# Patient Record
Sex: Female | Born: 1945
Health system: Southern US, Community
[De-identification: ages and names within clinical notes are randomized; demographics above are authoritative.]

## PROBLEM LIST (undated history)

## (undated) DIAGNOSIS — J189 Pneumonia, unspecified organism: Secondary | ICD-10-CM

## (undated) DIAGNOSIS — I10 Essential (primary) hypertension: Secondary | ICD-10-CM

## (undated) DIAGNOSIS — Z87442 Personal history of urinary calculi: Secondary | ICD-10-CM

## (undated) DIAGNOSIS — I872 Venous insufficiency (chronic) (peripheral): Secondary | ICD-10-CM

## (undated) DIAGNOSIS — G709 Myoneural disorder, unspecified: Secondary | ICD-10-CM

## (undated) DIAGNOSIS — M810 Age-related osteoporosis without current pathological fracture: Secondary | ICD-10-CM

## (undated) DIAGNOSIS — E785 Hyperlipidemia, unspecified: Secondary | ICD-10-CM

## (undated) HISTORY — PX: NO PAST SURGERIES: SHX2092

## (undated) HISTORY — DX: Hyperlipidemia, unspecified: E78.5

## (undated) HISTORY — DX: Venous insufficiency (chronic) (peripheral): I87.2

## (undated) HISTORY — DX: Essential (primary) hypertension: I10

## (undated) HISTORY — DX: Age-related osteoporosis without current pathological fracture: M81.0

---

## 2000-01-01 ENCOUNTER — Encounter: Admission: RE | Admit: 2000-01-01 | Discharge: 2000-01-01 | Payer: Self-pay | Admitting: Family Medicine

## 2000-01-03 ENCOUNTER — Encounter: Payer: Self-pay | Admitting: Family Medicine

## 2003-12-07 ENCOUNTER — Encounter: Admission: RE | Admit: 2003-12-07 | Discharge: 2003-12-07 | Payer: Self-pay | Admitting: Family Medicine

## 2008-12-13 ENCOUNTER — Encounter: Admission: RE | Admit: 2008-12-13 | Discharge: 2008-12-13 | Payer: Self-pay | Admitting: Family Medicine

## 2008-12-15 ENCOUNTER — Encounter: Admission: RE | Admit: 2008-12-15 | Discharge: 2008-12-15 | Payer: Self-pay | Admitting: Family Medicine

## 2010-01-03 ENCOUNTER — Encounter: Admission: RE | Admit: 2010-01-03 | Discharge: 2010-01-03 | Payer: Self-pay | Admitting: Family Medicine

## 2010-12-09 ENCOUNTER — Encounter: Payer: Self-pay | Admitting: Family Medicine

## 2011-10-18 ENCOUNTER — Other Ambulatory Visit: Payer: Self-pay | Admitting: Family Medicine

## 2011-10-18 DIAGNOSIS — R102 Pelvic and perineal pain: Secondary | ICD-10-CM

## 2011-10-22 ENCOUNTER — Other Ambulatory Visit: Payer: Self-pay | Admitting: Family Medicine

## 2011-10-22 ENCOUNTER — Ambulatory Visit
Admission: RE | Admit: 2011-10-22 | Discharge: 2011-10-22 | Disposition: A | Discharge status: Home / Self Care | Payer: Medicare Other | Source: Ambulatory Visit | Attending: Family Medicine | Admitting: Family Medicine

## 2011-10-22 DIAGNOSIS — R102 Pelvic and perineal pain: Secondary | ICD-10-CM

## 2012-02-06 ENCOUNTER — Ambulatory Visit (INDEPENDENT_AMBULATORY_CARE_PROVIDER_SITE_OTHER): Payer: Medicare Other | Admitting: Family Medicine

## 2012-02-06 VITALS — BP 146/77 | HR 84 | Temp 98.2°F | Resp 16 | Ht 66.0 in | Wt 145.0 lb

## 2012-02-06 DIAGNOSIS — R350 Frequency of micturition: Secondary | ICD-10-CM

## 2012-02-06 LAB — POCT URINALYSIS DIPSTICK
Nitrite, UA: NEGATIVE
Protein, UA: NEGATIVE
Urobilinogen, UA: 0.2
pH, UA: 6

## 2012-02-06 LAB — POCT UA - MICROSCOPIC ONLY
Casts, Ur, LPF, POC: NEGATIVE
Yeast, UA: NEGATIVE

## 2012-02-06 MED ORDER — NITROFURANTOIN MONOHYD MACRO 100 MG PO CAPS: 100.0000 mg | ORAL_CAPSULE | Freq: Two times a day (BID) | ORAL | Status: AC

## 2012-02-06 NOTE — Progress Notes (Signed)
  Subjective:    Patient ID: Nina Ramos, female    DOB: [birth year], 66 y.o.   MRN: [id number]  HPI 66 yo female with UTI symptoms for 1 week.  Having pressure and increased frequency, slight dysuria.  No fever or back pain. Last one was last summer.    Review of Systems Negative except as per HPI     Objective:   Physical Exam  Constitutional: She appears well-developed.  Pulmonary/Chest: Effort normal.  Abdominal: Soft. There is no tenderness.  Neurological: She is alert.     Results for orders placed in visit on 02/06/12  POCT URINALYSIS DIPSTICK      Component Value Range   Color, UA yellow     Clarity, UA slightly cloudy     Glucose, UA neg     Bilirubin, UA neg     Ketones, UA neg     Spec Grav, UA <=1.005     Blood, UA trace-lysed     pH, UA 6.0     Protein, UA neg     Urobilinogen, UA 0.2     Nitrite, UA neg     Leukocytes, UA large (3+)    POCT UA - MICROSCOPIC ONLY      Component Value Range   WBC, Ur, HPF, POC 2-10     RBC, urine, microscopic 0-1     Bacteria, U Microscopic trace     Mucus, UA neg     Epithelial cells, urine per micros neg     Crystals, Ur, HPF, POC neg     Casts, Ur, LPF, POC neg     Yeast, UA neg          Assessment & Plan:  Likely UTI.  Culture pending.  Macrobid.

## 2012-02-10 LAB — URINE CULTURE: Colony Count: 75000

## 2012-06-22 ENCOUNTER — Ambulatory Visit (INDEPENDENT_AMBULATORY_CARE_PROVIDER_SITE_OTHER): Payer: Medicare Other | Admitting: Family Medicine

## 2012-06-22 VITALS — BP 132/78 | HR 85 | Temp 97.9°F | Resp 18 | Ht 65.5 in | Wt 144.0 lb

## 2012-06-22 DIAGNOSIS — R350 Frequency of micturition: Secondary | ICD-10-CM

## 2012-06-22 DIAGNOSIS — N390 Urinary tract infection, site not specified: Secondary | ICD-10-CM

## 2012-06-22 LAB — POCT URINALYSIS DIPSTICK
Bilirubin, UA: NEGATIVE
Glucose, UA: NEGATIVE
Ketones, UA: NEGATIVE
Nitrite, UA: NEGATIVE
Protein, UA: NEGATIVE
Spec Grav, UA: 1.015
Urobilinogen, UA: 0.2
pH, UA: 5.5

## 2012-06-22 LAB — POCT UA - MICROSCOPIC ONLY
Casts, Ur, LPF, POC: NEGATIVE
Crystals, Ur, HPF, POC: NEGATIVE
Mucus, UA: NEGATIVE
Yeast, UA: NEGATIVE

## 2012-06-22 MED ORDER — NITROFURANTOIN MONOHYD MACRO 100 MG PO CAPS: 100.0000 mg | ORAL_CAPSULE | Freq: Two times a day (BID) | ORAL | Status: AC

## 2012-06-22 NOTE — Progress Notes (Signed)
[facility]:  Office Visit  Chief Complaint:  Chief Complaint  Patient presents with  . Urinary Tract Infection    patient thinks she has a uti     HPI: Nina Ramos is a 66 y.o. female who complains of  Increase urinary frequency and bladder pressure. Was here with E. Coli UTI in March. Denies UTI, Denies hematuria, dysuria  History reviewed. No pertinent past medical history. History reviewed. No pertinent past surgical history. History   Social History  . Marital Status: Married    Spouse Name: N/A    Number of Children: N/A  . Years of Education: N/A   Social History Main Topics  . Smoking status: Former Games developer  . Smokeless tobacco: Former Neurosurgeon    Quit date: 02/05/1994  . Alcohol Use: None  . Drug Use: None  . Sexually Active: None   Other Topics Concern  . None   Social History Narrative  . None   Family History  Problem Relation Age of Onset  . Asthma Mother   . Heart disease Father    No Known Allergies Prior to Admission medications   Not on File     ROS: The patient denies fevers, chills, night sweats, unintentional weight loss, chest pain, palpitations, wheezing, dyspnea on exertion, nausea, vomiting, abdominal pain, dysuria, hematuria, melena, numbness, weakness, or tingling.  All other systems have been reviewed and were otherwise negative with the exception of those mentioned in the HPI and as above.    PHYSICAL EXAM: Filed Vitals:   06/22/12 1215  BP: 132/78  Pulse: 85  Temp: 97.9 F (36.6 C)  Resp: 18   Filed Vitals:   06/22/12 1215  Height: 5' 5.5" (1.664 m)  Weight: 144 lb (65.318 kg)   Body mass index is 23.60 kg/(m^2).  General: Alert, no acute distress HEENT:  Normocephalic, atraumatic, oropharynx patent.  Cardiovascular:  Regular rate and rhythm, no rubs murmurs or gallops.  No Carotid bruits, radial pulse intact. No pedal edema.  Respiratory: Clear to auscultation bilaterally.  No wheezes, rales, or  rhonchi.  No cyanosis, no use of accessory musculature GI: No organomegaly, abdomen is soft and non-tender, positive bowel sounds.  No masses. Skin: No rashes. Neurologic: Facial musculature symmetric. Psychiatric: Patient is appropriate throughout our interaction. Lymphatic: No cervical lymphadenopathy Musculoskeletal: Gait intact. NO CVA tenderness   LABS: Results for orders placed in visit on 06/22/12  POCT UA - MICROSCOPIC ONLY      Component Value Range   WBC, Ur, HPF, POC 40-48     RBC, urine, microscopic 0-2     Bacteria, U Microscopic trace     Mucus, UA neg     Epithelial cells, urine per micros 0-1     Crystals, Ur, HPF, POC neg     Casts, Ur, LPF, POC neg     Yeast, UA neg    POCT URINALYSIS DIPSTICK      Component Value Range   Color, UA yellow     Clarity, UA cloudy     Glucose, UA neg     Bilirubin, UA neg     Ketones, UA neg     Spec Grav, UA 1.015     Blood, UA small     pH, UA 5.5     Protein, UA neg     Urobilinogen, UA 0.2     Nitrite, UA neg     Leukocytes, UA moderate (2+)  EKG/XRAY:   Primary read interpreted by Dr. Conley Rolls at [facility]   ASSESSMENT/PLAN: Encounter Diagnoses  Name Primary?  . Urine frequency Yes  . UTI (lower urinary tract infection)    Macrobid x 7 days Culture pending     Ginevra Tacker PHUONG, DO 06/22/2012 1:05 PM

## 2012-06-25 LAB — URINE CULTURE: Colony Count: 15000

## 2012-11-06 ENCOUNTER — Ambulatory Visit (INDEPENDENT_AMBULATORY_CARE_PROVIDER_SITE_OTHER): Payer: Medicare Other | Admitting: *Deleted

## 2012-11-06 ENCOUNTER — Encounter: Payer: Self-pay | Admitting: *Deleted

## 2012-11-06 VITALS — Temp 97.8°F

## 2012-11-06 DIAGNOSIS — Z23 Encounter for immunization: Secondary | ICD-10-CM

## 2013-06-09 ENCOUNTER — Ambulatory Visit (INDEPENDENT_AMBULATORY_CARE_PROVIDER_SITE_OTHER): Payer: Medicare Other | Admitting: Emergency Medicine

## 2013-06-09 VITALS — BP 126/72 | HR 81 | Temp 97.7°F | Resp 18 | Ht 66.0 in | Wt 144.0 lb

## 2013-06-09 DIAGNOSIS — H1030 Unspecified acute conjunctivitis, unspecified eye: Secondary | ICD-10-CM

## 2013-06-09 MED ORDER — OFLOXACIN 0.3 % OP SOLN: 1.0000 [drp] | Freq: Four times a day (QID) | OPHTHALMIC | Status: DC

## 2013-06-09 NOTE — Progress Notes (Signed)
[facility] [address] [place] [address] [phone]  Date:  06/09/2013   Name:  Nina Ramos   DOB:  02-Apr-1946   MRN:  [id number]  PCP:  Rockne Coons, DO    Chief Complaint: Conjunctivitis   History of Present Illness:  Nina Ramos is a 67 y.o. very pleasant female patient who presents with the following:  2 day history of foreign body sensation in right eye.  No history of actual FB.  Eye now red and has no pain. No visual complaints.  Put a flax seed in her eye to attract the object and that did not have the desired effect.  No improvement with over the counter medications or other home remedies. Denies other complaint or health concern today.   There are no active problems to display for this patient.   History reviewed. No pertinent past medical history.  History reviewed. No pertinent past surgical history.  History  Substance Use Topics  . Smoking status: Former Games developer  . Smokeless tobacco: Former Neurosurgeon    Quit date: 02/05/1994  . Alcohol Use: Not on file    Family History  Problem Relation Age of Onset  . Asthma Mother   . Heart disease Father     No Known Allergies  Medication list has been reviewed and updated.  No current outpatient prescriptions on file prior to visit.   No current facility-administered medications on file prior to visit.    Review of Systems:  As per HPI, otherwise negative.    Physical Examination: Filed Vitals:   06/09/13 1201  BP: 126/72  Pulse: 81  Temp: 97.7 F (36.5 C)  Resp: 18   Filed Vitals:   06/09/13 1201  Height: 5\' 6"  (1.676 m)  Weight: 144 lb (65.318 kg)   Body mass index is 23.25 kg/(m^2). Ideal Body Weight: Weight in (lb) to have BMI = 25: 154.6   GEN: WDWN, NAD, Non-toxic, Alert & Oriented x 3 HEENT: Atraumatic, Normocephalic.  Ears and Nose: No external deformity. EXTR: No clubbing/cyanosis/edema NEURO: Normal gait.  PSYCH: Normally interactive. Conversant.  Not depressed or anxious appearing.  Calm demeanor.  EYE:  Right eye injected. No visible foreign body.  PRRERLA EOMI.  No fluorescein uptake.    Assessment and Plan: Conjunctivitis Ocuflox Follow up tomorrow for continued pain   Signed,  Phillips Odor, MD

## 2013-06-09 NOTE — Patient Instructions (Addendum)

## 2013-10-10 ENCOUNTER — Telehealth: Payer: Self-pay | Admitting: *Deleted

## 2013-10-10 NOTE — Telephone Encounter (Signed)
Called and left message for patient reminding her that a mammogram is overdue and to please call and schedule before the end of the calendar year if possible.

## 2013-10-20 ENCOUNTER — Ambulatory Visit (INDEPENDENT_AMBULATORY_CARE_PROVIDER_SITE_OTHER): Payer: Medicare Other | Admitting: Family Medicine

## 2013-10-20 VITALS — BP 138/78 | HR 82 | Temp 97.8°F | Resp 16 | Ht 65.5 in | Wt 142.6 lb

## 2013-10-20 DIAGNOSIS — H9190 Unspecified hearing loss, unspecified ear: Secondary | ICD-10-CM

## 2013-10-20 DIAGNOSIS — H6122 Impacted cerumen, left ear: Secondary | ICD-10-CM

## 2013-10-20 DIAGNOSIS — H6120 Impacted cerumen, unspecified ear: Secondary | ICD-10-CM

## 2013-10-20 NOTE — Progress Notes (Signed)
[facility] [address] [place] [address] [phone]  Date:  10/20/2013   Name:  Nina Ramos   DOB:  Dec 26, 1945   MRN:  [id number]  PCP:  Rockne Coons, DO    Chief Complaint: Cerumen Impaction   History of Present Illness:  Nina Ramos is a 67 y.o. very pleasant female patient who presents with the following:  Here due to concern regarding possible impacted cerumen left ear.  She has noted a feeling of something in the ear and decreased hearing over the last several weeks.  Admits she usually uses a bobby pin to clear wax from her ear.  Otherwise she is feeling well today  There are no active problems to display for this patient.   No past medical history on file.  No past surgical history on file.  History  Substance Use Topics  . Smoking status: Former Games developer  . Smokeless tobacco: Former Neurosurgeon    Quit date: 02/05/1994  . Alcohol Use: Not on file    Family History  Problem Relation Age of Onset  . Asthma Mother   . Heart disease Father     No Known Allergies  Medication list has been reviewed and updated.  Current Outpatient Prescriptions on File Prior to Visit  Medication Sig Dispense Refill  . ofloxacin (OCUFLOX) 0.3 % ophthalmic solution Place 1 drop into the right eye 4 (four) times daily.  5 mL  0   No current facility-administered medications on file prior to visit.    Review of Systems:  As per HPI- otherwise negative.   Physical Examination: Filed Vitals:   10/20/13 1424  BP: 138/78  Pulse: 82  Temp: 97.8 F (36.6 C)  Resp: 16   Filed Vitals:   10/20/13 1424  Height: 5' 5.5" (1.664 m)  Weight: 142 lb 9.6 oz (64.683 kg)   Body mass index is 23.36 kg/(m^2). Ideal Body Weight: Weight in (lb) to have BMI = 25: 152.2  GEN: WDWN, NAD, Non-toxic, A & O x 3 HEENT: Atraumatic, Normocephalic. Neck supple. No masses, No LAD. Ears and Nose: No external deformity. CV: RRR, No M/G/R. No JVD. No thrill. No extra  heart sounds. PULM: CTA B, no wheezes, crackles, rhonchi. No retractions. No resp. distress. No accessory muscle use. EXTR: No c/c/e NEURO Normal gait.  PSYCH: Normally interactive. Conversant. Not depressed or anxious appearing.  Calm demeanor.  Left TM is obscured by cerumen.  Right TM normal. Ear treated with colace, irrigated, treated with colace again and then re- irrigated.  A combination of irrigation and ear curette was used by myself personally to removed impacted cerumen from the left ear canal. The TM and ear then appeared to be normal.    Assessment and Plan: Impacted cerumen, left  Irrigated and removed wax from left ear   Signed Abbe Amsterdam, MD

## 2014-01-12 ENCOUNTER — Telehealth: Payer: Self-pay

## 2014-01-12 NOTE — Telephone Encounter (Signed)
Called her back- her Nina Ramos summons is not until April. Asked her to come and see me in the next few weeks so I can document her anxiety sx truthfully.  We have never discussed this face to face as far as I am aware.

## 2014-01-12 NOTE — Telephone Encounter (Signed)
Nina PikesSusan Ramos reported that pt (her mother) is very anxious after receiving a summons for [employer]. She was previously a pt of [name]'s and now considers [name] her PCP. Pt has been seen for hives and shaking stemming from anxiety. Daughter states that pt has a phobia of crowds and being around a lot of people that she does not know. Pt requests a note explaining this that would excuse her from jury duty. We had written one for pt in the past but we do not have a copy in EPIC. I have put her paper chart in Dr Copland's box. [name], can you write a letter or do you need to see pt for this first?

## 2014-01-14 ENCOUNTER — Ambulatory Visit (INDEPENDENT_AMBULATORY_CARE_PROVIDER_SITE_OTHER): Payer: Medicare Other | Admitting: Family Medicine

## 2014-01-14 VITALS — BP 140/80 | HR 85 | Temp 98.1°F | Resp 16 | Ht 65.5 in | Wt 149.0 lb

## 2014-01-14 DIAGNOSIS — F4001 Agoraphobia with panic disorder: Secondary | ICD-10-CM

## 2014-01-14 DIAGNOSIS — F411 Generalized anxiety disorder: Secondary | ICD-10-CM

## 2014-01-14 DIAGNOSIS — F4000 Agoraphobia, unspecified: Secondary | ICD-10-CM | POA: Insufficient documentation

## 2014-01-14 HISTORY — DX: Generalized anxiety disorder: F41.1
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
## 2014-01-14 NOTE — Patient Instructions (Signed)
Take care- good to see you today!

## 2014-02-11 ENCOUNTER — Ambulatory Visit (INDEPENDENT_AMBULATORY_CARE_PROVIDER_SITE_OTHER): Payer: Medicare Other | Admitting: Internal Medicine

## 2014-02-11 VITALS — BP 150/88 | HR 78 | Temp 97.7°F | Resp 18 | Ht 65.0 in | Wt 146.2 lb

## 2014-02-11 DIAGNOSIS — H00019 Hordeolum externum unspecified eye, unspecified eyelid: Secondary | ICD-10-CM

## 2014-02-11 DIAGNOSIS — H0019 Chalazion unspecified eye, unspecified eyelid: Secondary | ICD-10-CM

## 2014-02-11 MED ORDER — AMOXICILLIN 500 MG PO CAPS: 1000.0000 mg | ORAL_CAPSULE | Freq: Two times a day (BID) | ORAL | Status: DC

## 2014-02-11 MED ORDER — ERYTHROMYCIN 5 MG/GM OP OINT: 1.0000 "application " | TOPICAL_OINTMENT | Freq: Every day | OPHTHALMIC | Status: DC

## 2014-02-11 NOTE — Patient Instructions (Signed)
Sty A sty (hordeolum) is an infection of a gland in the eyelid located at the base of the eyelash. A sty may develop a white or yellow head of pus. It can be puffy (swollen). Usually, the sty will burst and pus will come out on its own. They do not leave lumps in the eyelid once they drain. A sty is often confused with another form of cyst of the eyelid called a chalazion. Chalazions occur within the eyelid and not on the edge where the bases of the eyelashes are. They often are red, sore and then form firm lumps in the eyelid. CAUSES   Germs (bacteria).  Lasting (chronic) eyelid inflammation. SYMPTOMS   Tenderness, redness and swelling along the edge of the eyelid at the base of the eyelashes.  Sometimes, there is a white or yellow head of pus. It may or may not drain. DIAGNOSIS  An ophthalmologist will be able to distinguish between a sty and a chalazion and treat the condition appropriately.  TREATMENT   Styes are typically treated with warm packs (compresses) until drainage occurs.  In rare cases, medicines that kill germs (antibiotics) may be prescribed. These antibiotics may be in the form of drops, cream or pills.  If a hard lump has formed, it is generally necessary to do a small incision and remove the hardened contents of the cyst in a minor surgical procedure done in the office.  In suspicious cases, your caregiver may send the contents of the cyst to the lab to be certain that it is not a rare, but dangerous form of cancer of the glands of the eyelid. HOME CARE INSTRUCTIONS   Wash your hands often and dry them with a clean towel. Avoid touching your eyelid. This may spread the infection to other parts of the eye.  Apply heat to your eyelid for 10 to 20 minutes, several times a day, to ease pain and help to heal it faster.  Do not squeeze the sty. Allow it to drain on its own. Wash your eyelid carefully 3 to 4 times per day to remove any pus. SEEK IMMEDIATE MEDICAL CARE IF:    Your eye becomes painful or puffy (swollen).  Your vision changes.  Your sty does not drain by itself within 3 days.  Your sty comes back within a short period of time, even with treatment.  You have redness (inflammation) around the eye.  You have a fever. Document Released: 08/14/2005 Document Revised: 01/27/2012 Document Reviewed: 04/18/2009 ExitCare Patient Information 2014 [employer].  

## 2014-02-11 NOTE — Progress Notes (Signed)
   Subjective:    Patient ID: Nina CreeksNancy T Perret, female    DOB: Jan 09, 1946, 68 y.o.   MRN: [id number]  HPI Has 1072 week old nodule on left upper eye lid. Was tender but less so now. Hx of styes in past.   Review of Systems     Objective:   Physical Exam  Vitals reviewed. Constitutional: She is oriented to person, place, and time. She appears well-developed and well-nourished.  HENT:  Head: Normocephalic.  Eyes: EOM are normal. Pupils are equal, round, and reactive to light. Right eye exhibits no discharge, no exudate and no hordeolum. Left eye exhibits hordeolum. Left eye exhibits no discharge and no exudate. Right conjunctiva is not injected. Right conjunctiva has no hemorrhage. Left conjunctiva is not injected. Left conjunctiva has no hemorrhage.    Cystic nodule 3-624mm  Neck: Normal range of motion.  Musculoskeletal: Normal range of motion.  Neurological: She is alert and oriented to person, place, and time. She exhibits normal muscle tone. Coordination normal.  Skin: Rash noted.  Psychiatric: She has a normal mood and affect. Her behavior is normal.          Assessment & Plan:  Hordeolum/Chalazion Emycin eye ointment/Amoxil 1g bid Warm compresses/Massage See opthalmologist

## 2014-07-07 ENCOUNTER — Ambulatory Visit (INDEPENDENT_AMBULATORY_CARE_PROVIDER_SITE_OTHER): Payer: Medicare Other | Admitting: Physician Assistant

## 2014-07-07 ENCOUNTER — Encounter: Payer: Self-pay | Admitting: Physician Assistant

## 2014-07-07 VITALS — BP 138/74 | HR 64 | Temp 98.0°F | Resp 16 | Ht 64.0 in | Wt 141.0 lb

## 2014-07-07 DIAGNOSIS — L239 Allergic contact dermatitis, unspecified cause: Secondary | ICD-10-CM

## 2014-07-07 DIAGNOSIS — L259 Unspecified contact dermatitis, unspecified cause: Secondary | ICD-10-CM

## 2014-07-07 MED ORDER — PREDNISONE 20 MG PO TABS: ORAL_TABLET | ORAL | Status: DC

## 2014-07-07 NOTE — Progress Notes (Signed)
    Patient ID: Nina Ramos MRN: [id number], DOB: 30-Jan-1946, 68 y.o. Date of Encounter: 07/07/2014, 3:53 PM    Chief Complaint:  Chief Complaint  Patient presents with  . Rash    x3 weeks- 5 red round areas to L lower leg, abdomen and neck- itchy- starts out as blister and then bursts and scabs over     HPI: 68 y.o. year old white female says she initially developed areas of this rash on her lower legs. Developed little clusters of tiny vesicles. Developed several areas of such clusters on her lower legs and since then has developed similar lesions on other areas of her body. She has one area on her lower abdomen, one on her buttock and a couple on her arms.  The initial site which were tiny vesicles several weeks ago now are still not resolved--- those older areas now are pink colored areas. Says that they are very itchy. Says that she works in her yard and flowers a lot and thinks she has gotten into some poison oak or poison ivy.     Home Meds:  None  Allergies: No Known Allergies    Review of Systems: See HPI for pertinent ROS. All other ROS negative.    Physical Exam: Blood pressure 138/74, pulse 64, temperature 98 F (36.7 C), temperature source Oral, resp. rate 16, height 5\' 4"  (1.626 m), weight 141 lb (63.957 kg)., Body mass index is 24.19 kg/(m^2). General: WNWD WF.  Appears in no acute distress. Neck: Supple. No thyromegaly. No lymphadenopathy. Lungs: Clear bilaterally to auscultation without wheezes, rales, or rhonchi. Breathing is unlabored. Heart: Regular rhythm. No murmurs, rubs, or gallops. Msk:  Strength and tone normal for age. Extremities/Skin: Left lower shin: She says this is the newest lesion: Cluster of tiny pink vessicles, entire cluster measures approx 0.5 cm.  She also shows me the area on her lower abdomen. This is an approximate 0.75 cm diameter area of pink coloration with overlying scale--c/w healing. On her forearm she also has cluster of tiny  pink vesicles/papules. Neuro: Alert and oriented X 3. Moves all extremities spontaneously. Gait is normal. CNII-XII grossly in tact. Psych:  Responds to questions appropriately with a normal affect.     ASSESSMENT AND PLAN:  68 y.o. year old female with  1. Allergic dermatitis Consistent with allergic dermatitis most likely poison oak or poison ivy. She is to take the prednisone as directed below. If rash does not resolve after completion of prednisone and she is to followup. Cautioned her that prednisone may cause her to feel jittery and hyper and this is a known side effect of the prednisone. - predniSONE (DELTASONE) 20 MG tablet; Take 3 daily for 2 days, then 2 daily for 2 days, then 1 daily for 2 days.  Dispense: 12 tablet; Refill: 0   Signed, [address]Mary Beth SausalDixon, [place], [facility] 07/07/2014 3:53 PM

## 2014-07-22 ENCOUNTER — Ambulatory Visit (INDEPENDENT_AMBULATORY_CARE_PROVIDER_SITE_OTHER): Payer: Medicare Other | Admitting: Family Medicine

## 2014-07-22 ENCOUNTER — Encounter: Payer: Self-pay | Admitting: Family Medicine

## 2014-07-22 VITALS — BP 130/68 | HR 68 | Temp 98.1°F | Resp 14

## 2014-07-22 DIAGNOSIS — R21 Rash and other nonspecific skin eruption: Secondary | ICD-10-CM | POA: Insufficient documentation

## 2014-07-22 DIAGNOSIS — IMO0002 Reserved for concepts with insufficient information to code with codable children: Secondary | ICD-10-CM

## 2014-07-22 DIAGNOSIS — S29011A Strain of muscle and tendon of front wall of thorax, initial encounter: Secondary | ICD-10-CM | POA: Insufficient documentation

## 2014-07-22 MED ORDER — CLOTRIMAZOLE-BETAMETHASONE 1-0.05 % EX CREA: 1.0000 "application " | TOPICAL_CREAM | Freq: Two times a day (BID) | CUTANEOUS | Status: DC

## 2014-07-22 NOTE — Patient Instructions (Signed)
Take  of ibuprofen and try heat and stretch out the pec muscle Use cream twice a day for the rash, if not better in 1 week, I would recommend dermatology F/U as needed

## 2014-07-22 NOTE — Assessment & Plan Note (Signed)
Muscle strain, OTC Ibuprofen which pt prefers, hold on imaging, not cardiac

## 2014-07-22 NOTE — Assessment & Plan Note (Addendum)
Unclear cause, does not appear to be baterial releated, few spore like lesions on skin scraping KOH at bedside? fungal, does not look like shingles, possible contact dermatitis but did respond well to predisone If it does not clear would recommend dermatology

## 2014-07-22 NOTE — Progress Notes (Signed)
Patient ID: Nina Ramos, female   DOB: 06-30-1946, 68 y.o.   MRN: [id number]   Subjective:    Patient ID: Nina Ramos, female    DOB: 1946/05/15, 68 y.o.   MRN: [id number]  Patient presents for Re-check Rash and Fall  Patient here for recheck on rash appears she was seen about a week and a half ago at that time had a blistering rash that have popped up on her abdomen in her leg she was given prednisone taper this caused a lot of her legs are dry up some but she still has a few new lesions are very tiny. They're still itchy in nature. She does garden a lot therefore she's not sure she became in contact with something in her garden. She denies any change in her diet soap lotion otherwise. She did have one lesion under her axilla but that is already resolved. She denies any fever or recent illness.  Chest wall pain the left side for the past couple of days she states she was cared to plan out to a family member when she tripped and fell with an outstretched arm since then she's had some soreness the left chest wall typically which she takes a breath she did not have any significant bruising. She denies any sharp chest pain with radiation denies any nausea vomiting or shortness of breath associated.   Review Of Systems:  GEN- denies fatigue, fever, weight loss,weakness, recent illness HEENT- denies eye drainage, change in vision, nasal discharge, CVS- denies chest pain, palpitations RESP- denies SOB, cough, wheeze ABD- denies N/V, change in stools, abd pain GU- denies dysuria, hematuria, dribbling, incontinence MSK- denies joint pain,+ muscle aches, injury Neuro- denies headache, dizziness, syncope, seizure activity       Objective:    BP 130/68  Pulse 68  Temp(Src) 98.1 F (36.7 C) (Oral)  Resp 14 GEN- NAD, alert and oriented x3 HEENT-PERRL, EOMI, MMM, oropharynx clear HEENT- PERRL,LAD CVS- RRR, no murmur MSK- Left pec TTP, Rotator cuff intact, biceps in tact, pain with left arm  across shoulder , normal back scratch but discomfort with push off from mid back in pec RESP-CTAB Skin- right lower abdomen - erythematous raised scaly rash, 2 small maculopapular lesions adjancet, left leg, 2 dry dime size scaley macular lesions, NT EXT- No edema Pulses- Radial 2+        Assessment & Plan:      Problem List Items Addressed This Visit   Strain of left pectoralis muscle - Primary     Muscle strain, OTC Ibuprofen which pt prefers, hold on imaging, not cardiac    Rash and nonspecific skin eruption     Unclear cause, does not appear to be baterial releated, few spore like lesions ? fungal, does not look like shingles, possible contact dermatitis but did respond well to predisone       Note: This dictation was prepared with Dragon dictation along with smaller phrase technology. Any transcriptional errors that result from this process are unintentional.

## 2014-08-04 ENCOUNTER — Telehealth: Payer: Self-pay | Admitting: *Deleted

## 2014-08-04 MED ORDER — PROMETHAZINE HCL 25 MG RE SUPP: 25.0000 mg | Freq: Four times a day (QID) | RECTAL | Status: DC | PRN

## 2014-08-04 NOTE — Telephone Encounter (Signed)
Pt called stating needing refill for promethazine , insert one suppostory rectally as needed

## 2014-08-18 ENCOUNTER — Telehealth: Payer: Self-pay | Admitting: Physician Assistant

## 2014-08-18 DIAGNOSIS — R21 Rash and other nonspecific skin eruption: Secondary | ICD-10-CM

## 2014-08-18 NOTE — Telephone Encounter (Signed)
Pt daughter Nina SinnerSusan Ramos is wanting to know what her mother is needing to do next because the blisters are back on her mother. [phone]

## 2014-08-19 NOTE — Telephone Encounter (Signed)
Place order for referral to dermatology. Patient has seen me regarding this rash and has seen Dr. Jeanice Limurham also.  Dr.Whitehall's note  said that she was not certain what was the cause of this rash and if it did not resolve with the cream she prescribed then she would recommend followup with dermatology.

## 2014-08-19 NOTE — Telephone Encounter (Signed)
Dermatology referral- blistering rash

## 2014-08-22 NOTE — Telephone Encounter (Signed)
Daughter aware referral has been initiated

## 2014-10-19 ENCOUNTER — Encounter: Payer: Self-pay | Admitting: Family Medicine

## 2014-10-19 ENCOUNTER — Ambulatory Visit (INDEPENDENT_AMBULATORY_CARE_PROVIDER_SITE_OTHER): Payer: Medicare Other | Admitting: Family Medicine

## 2014-10-19 VITALS — BP 128/74 | HR 72 | Temp 98.5°F | Resp 14 | Ht 65.0 in | Wt 136.0 lb

## 2014-10-19 DIAGNOSIS — L280 Lichen simplex chronicus: Secondary | ICD-10-CM

## 2014-10-19 MED ORDER — HYDROXYZINE HCL 10 MG PO TABS: 10.0000 mg | ORAL_TABLET | Freq: Three times a day (TID) | ORAL | Status: DC | PRN

## 2014-10-19 NOTE — Patient Instructions (Addendum)
Take itching pill as needed We will call with lab results  F/U pending results

## 2014-10-19 NOTE — Progress Notes (Signed)
Patient ID: Nina CreeksNancy T Ramos, female   DOB: [birth year], 68 y.o.   MRN: [id number]   Subjective:    Patient ID: Nina CreeksNancy T Ramos, female    DOB: [birth year], 68 y.o.   MRN: [id number]  Patient presents for Skin Irritation  patient here with continued rash eruption. She was seen back in September after she had had a rash for about 2 months. She was treated with multiple rounds of prednisone as well as topical creams including antifungal she continued to have outbreaks of maculopapular lesions which were very pruritic. I sent her to dermatology biopsy was done which showed lichenoid reaction. She was given another round of steroids he thought it was probably a drug reaction however she does not take any medications on a regular basis she has not taken any NSAIDs in a few months. She now has lesions all over her arms legs abdomen and back which are very pruritic. She does garden and is exposed to some poisonous plants. She does not have any known food allergies.    Review Of Systems:  GEN- denies fatigue, fever, weight loss,weakness, recent illness HEENT- denies eye drainage, change in vision, nasal discharge, CVS- denies chest pain, palpitations RESP- denies SOB, cough, wheeze ABD- denies N/V, change in stools, abd pain GU- denies dysuria, hematuria, dribbling, incontinence MSK- denies joint pain, muscle aches, injury Neuro- denies headache, dizziness, syncope, seizure activity       Objective:    BP 128/74 mmHg  Pulse 72  Temp(Src) 98.5 F (36.9 C) (Oral)  Resp 14  Ht 5\' 5"  (1.651 m)  Wt 136 lb (61.689 kg)  BMI 22.63 kg/m2 GEN- NAD, alert and oriented x3, well appearing HEENT- no Oral lesions, MMM, oropharynx clear Skin- multiple lesions- some blistering lesions on abdomen and arms/legs, other erythematous maculopapular lesions with scab at center, hyperpigmented scarring from old lesions, no lesions on face Ext- no edema Pulse- Radial 2+          Assessment & Plan:      Problem  List Items Addressed This Visit    None    Visit Diagnoses    Lichenoid dermatitis    -  Primary    Will looks for other blood causes, check basic labs, allergy panel, Hepatitis, RPR as other most common cause as she is not on any prescription meds, not on NSA    Relevant Orders       CBC with Differential       RPR       Allergy full profile       Hepatitis panel, acute       Comprehensive metabolic panel       Note: This dictation was prepared with Dragon dictation along with smaller phrase technology. Any transcriptional errors that result from this process are unintentional.

## 2014-10-20 ENCOUNTER — Telehealth: Payer: Self-pay | Admitting: *Deleted

## 2014-10-20 LAB — ALLERGY FULL PROFILE
Allergen, D pternoyssinus,d7: <0.1 kU/L
Allergen,Goose feathers, e70: <0.1 kU/L
Aspergillus fumigatus, m3: <0.1 kU/L
Bahia Grass: <0.1 kU/L
Bermuda Grass: <0.1 kU/L
Box Elder IgE: <0.1 kU/L
Candida Albicans: <0.1 kU/L
Cat Dander: <0.1 kU/L
Common Ragweed: <0.1 kU/L
D. farinae: <0.1 kU/L
Dog Dander: <0.1 kU/L
G005 Rye, Perennial: <0.1 kU/L
Goldenrod: <0.1 kU/L
Helminthosporium halodes: <0.1 kU/L
House Dust Hollister: <0.1 kU/L
IgE (Immunoglobulin E), Serum: 16 kU/L (ref <115)
Oak: <0.1 kU/L
Plantain: <0.1 kU/L
Stemphylium Botryosum: <0.1 kU/L
Sycamore Tree: <0.1 kU/L
Timothy Grass: <0.1 kU/L

## 2014-10-20 LAB — HEPATITIS PANEL, ACUTE
HCV Ab: NEGATIVE
HEP A IGM: NONREACTIVE
HEP B C IGM: NONREACTIVE
HEP B S AG: NEGATIVE

## 2014-10-20 LAB — COMPREHENSIVE METABOLIC PANEL
ALK PHOS: 69 U/L (ref 39–117)
ALT: 15 U/L (ref 0–35)
AST: 22 U/L (ref 0–37)
Albumin: 4.4 g/dL (ref 3.5–5.2)
BILIRUBIN TOTAL: 0.4 mg/dL (ref 0.2–1.2)
BUN: 19 mg/dL (ref 6–23)
CO2: 28 mEq/L (ref 19–32)
CREATININE: 0.8 mg/dL (ref 0.50–1.10)
Calcium: 9.2 mg/dL (ref 8.4–10.5)
Chloride: 105 mEq/L (ref 96–112)
Glucose, Bld: 92 mg/dL (ref 70–99)
Potassium: 4.1 mEq/L (ref 3.5–5.3)
Sodium: 141 mEq/L (ref 135–145)
TOTAL PROTEIN: 6.5 g/dL (ref 6.0–8.3)

## 2014-10-20 LAB — CBC WITH DIFFERENTIAL/PLATELET
Basophils Absolute: 0 10*3/uL (ref 0.0–0.1)
Basophils Relative: 0 % (ref 0–1)
EOS ABS: 0.1 10*3/uL (ref 0.0–0.7)
EOS PCT: 2 % (ref 0–5)
HCT: 42.3 % (ref 36.0–46.0)
HEMOGLOBIN: 14 g/dL (ref 12.0–15.0)
LYMPHS ABS: 1.2 10*3/uL (ref 0.7–4.0)
Lymphocytes Relative: 35 % (ref 12–46)
MCH: 31.1 pg (ref 26.0–34.0)
MCHC: 33.1 g/dL (ref 30.0–36.0)
MCV: 94 fL (ref 78.0–100.0)
MONO ABS: 0.4 10*3/uL (ref 0.1–1.0)
MPV: 10.2 fL (ref 9.4–12.4)
Monocytes Relative: 12 % (ref 3–12)
Neutro Abs: 1.8 10*3/uL (ref 1.7–7.7)
Neutrophils Relative %: 51 % (ref 43–77)
PLATELETS: 282 10*3/uL (ref 150–400)
RBC: 4.5 MIL/uL (ref 3.87–5.11)
RDW: 14.4 % (ref 11.5–15.5)
WBC: 3.5 10*3/uL — ABNORMAL LOW (ref 4.0–10.5)

## 2014-10-20 LAB — RPR

## 2014-10-20 NOTE — Telephone Encounter (Signed)
Received request from pharmacy for PA on Atarax.  PA submitted.   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

## 2014-10-21 NOTE — Telephone Encounter (Signed)
Received PA determination.   PA approved: 10/20/2014- 10/21/2015

## 2015-01-16 ENCOUNTER — Ambulatory Visit (INDEPENDENT_AMBULATORY_CARE_PROVIDER_SITE_OTHER): Payer: Medicare Other | Admitting: Family Medicine

## 2015-01-16 ENCOUNTER — Encounter: Payer: Self-pay | Admitting: Family Medicine

## 2015-01-16 VITALS — BP 124/70 | HR 68 | Temp 98.2°F | Resp 14 | Ht 65.0 in | Wt 142.0 lb

## 2015-01-16 DIAGNOSIS — R1031 Right lower quadrant pain: Secondary | ICD-10-CM | POA: Diagnosis not present

## 2015-01-16 DIAGNOSIS — R300 Dysuria: Secondary | ICD-10-CM

## 2015-01-16 LAB — URINALYSIS, MICROSCOPIC ONLY
Bacteria, UA: NONE SEEN
Casts: NONE SEEN
Crystals: NONE SEEN
WBC UA: NONE SEEN WBC/hpf (ref <3)

## 2015-01-16 LAB — URINALYSIS, ROUTINE W REFLEX MICROSCOPIC
Bilirubin Urine: NEGATIVE
GLUCOSE, UA: NEGATIVE mg/dL
Ketones, ur: NEGATIVE mg/dL
Leukocytes, UA: NEGATIVE
NITRITE: NEGATIVE
PROTEIN: NEGATIVE mg/dL
Specific Gravity, Urine: 1.02 (ref 1.005–1.030)
UROBILINOGEN UA: 1 mg/dL (ref 0.0–1.0)
pH: 5.5 (ref 5.0–8.0)

## 2015-01-16 NOTE — Progress Notes (Signed)
Patient ID: Nina CreeksNancy T Ramos, female   DOB: [birth year], 69 y.o.   MRN: [id number]   Subjective:    Patient ID: Nina CreeksNancy T Ramos, female    DOB: [birth year], 69 y.o.   MRN: [id number]  Patient presents for Dysuria  Pt here with RLQ pain, describes sharp twinges of pain that come and go over past 3 days, she feels fine otherwise, they only last a few seconds, she has had in the past when she ate something that did not agree with her. No change in bowels, no dysuria or pressure, no blood in urine, no blood in stools, no N/V, no back pain, no vaginal discharge or abnormal bleeding  She did drink cranberry juice over the weekend and had chicken tenders from [employer] she has been a vegetarian until recently Review Of Systems:  GEN- denies fatigue, fever, weight loss,weakness, recent illness HEENT- denies eye drainage, change in vision, nasal discharge, CVS- denies chest pain, palpitations RESP- denies SOB, cough, wheeze ABD- denies N/V, change in stools, +abd pain GU- denies dysuria, hematuria, dribbling, incontinence MSK- denies joint pain, muscle aches, injury Neuro- denies headache, dizziness, syncope, seizure activity       Objective:    BP 124/70 mmHg  Pulse 68  Temp(Src) 98.2 F (36.8 C) (Oral)  Resp 14  Ht 5\' 5"  (1.651 m)  Wt 142 lb (64.411 kg)  BMI 23.63 kg/m2 GEN- NAD, alert and oriented x3 HEENT- PERRL, EOMI, non injected sclera, pink conjunctiva, MMM, oropharynx clear CVS- RRR, no murmur RESP-CTAB ABD-NABS,soft,NT,ND, no suprapubic tenderness, no CVA tenderness EXT- No edema Pulses- Radial 2+        Assessment & Plan:      Problem List Items Addressed This Visit    None    Visit Diagnoses    Dysuria    -  Primary    Relevant Orders    Urinalysis, Routine w reflex microscopic (Completed)    RLQ abdominal pain        ? related to gas with chagne in diet, normal exam ,no red flags, UA benign, will have her try gas x, if worsens she will let us know       Note:  This dictation was prepared with Dragon dictation along with smaller phrase technology. Any transcriptional errors that result from this process are unintentional.

## 2015-01-16 NOTE — Patient Instructions (Signed)
Your urine sample is normal Try Gas X or some type of gas relief Call if this does not improve F/U as needed

## 2015-06-19 ENCOUNTER — Ambulatory Visit: Payer: Medicare Other | Admitting: Family Medicine

## 2015-06-21 ENCOUNTER — Telehealth: Payer: Self-pay | Admitting: *Deleted

## 2015-06-21 DIAGNOSIS — H00013 Hordeolum externum right eye, unspecified eyelid: Secondary | ICD-10-CM

## 2015-06-21 NOTE — Telephone Encounter (Signed)
Referral order placed.   Patient daughter made aware.

## 2015-06-21 NOTE — Telephone Encounter (Signed)
-----   Message from Salley Scarlet, MD sent at 06/21/2015  1:20 PM EDT ----- Regarding: RE: Eye Referral Okay to send in Referral to eye doctor, put this in phone note Continue warm compresses, stop the OTC stye medication ----- Message -----    From: Durwin Nora Six, LPN    Sent: 03/19/8412   9:38 AM      To: Salley Scarlet, MD Subject: Eye Referral                                   Susan's mom's stye has gotten worse.   Darl Pikes states that area has developed crust like appearance and is sore to touch. Reports that area is larger and is still draining minimally. Patient is using warm compresses and has used OTC drawing fluid (?) to bring stye out.   Requesting referral to specialist. Does she need to be seen here first?

## 2015-06-22 DIAGNOSIS — H5203 Hypermetropia, bilateral: Secondary | ICD-10-CM | POA: Diagnosis not present

## 2015-07-17 DIAGNOSIS — H0016 Chalazion left eye, unspecified eyelid: Secondary | ICD-10-CM | POA: Diagnosis not present

## 2015-12-12 ENCOUNTER — Other Ambulatory Visit: Payer: Medicare Other

## 2015-12-12 ENCOUNTER — Other Ambulatory Visit: Payer: Self-pay | Admitting: Family Medicine

## 2015-12-12 DIAGNOSIS — L309 Dermatitis, unspecified: Secondary | ICD-10-CM

## 2015-12-12 DIAGNOSIS — F411 Generalized anxiety disorder: Secondary | ICD-10-CM | POA: Diagnosis not present

## 2015-12-12 DIAGNOSIS — R21 Rash and other nonspecific skin eruption: Secondary | ICD-10-CM | POA: Diagnosis not present

## 2015-12-12 DIAGNOSIS — L308 Other specified dermatitis: Secondary | ICD-10-CM | POA: Diagnosis not present

## 2015-12-12 DIAGNOSIS — L280 Lichen simplex chronicus: Secondary | ICD-10-CM | POA: Diagnosis not present

## 2015-12-12 NOTE — Progress Notes (Signed)
LABS NEEDED BY Dr. Terri Piedra for recurrent Dermatitis

## 2015-12-13 LAB — CBC WITH DIFFERENTIAL/PLATELET
Basophils Absolute: 0 10*3/uL (ref 0.0–0.1)
Basophils Relative: 0 % (ref 0–1)
Eosinophils Absolute: 0.1 10*3/uL (ref 0.0–0.7)
Eosinophils Relative: 2 % (ref 0–5)
HEMATOCRIT: 43.4 % (ref 36.0–46.0)
Hemoglobin: 14.4 g/dL (ref 12.0–15.0)
LYMPHS ABS: 1.3 10*3/uL (ref 0.7–4.0)
LYMPHS PCT: 39 % (ref 12–46)
MCH: 31.4 pg (ref 26.0–34.0)
MCHC: 33.2 g/dL (ref 30.0–36.0)
MCV: 94.8 fL (ref 78.0–100.0)
MONO ABS: 0.4 10*3/uL (ref 0.1–1.0)
MONOS PCT: 11 % (ref 3–12)
MPV: 10 fL (ref 8.6–12.4)
NEUTROS ABS: 1.6 10*3/uL — AB (ref 1.7–7.7)
NEUTROS PCT: 48 % (ref 43–77)
Platelets: 271 10*3/uL (ref 150–400)
RBC: 4.58 MIL/uL (ref 3.87–5.11)
RDW: 13.7 % (ref 11.5–15.5)
WBC: 3.4 10*3/uL — ABNORMAL LOW (ref 4.0–10.5)

## 2015-12-13 LAB — ANA: Anti Nuclear Antibody(ANA): NEGATIVE

## 2015-12-13 LAB — TSH: TSH: 2.907 u[IU]/mL (ref 0.350–4.500)

## 2016-02-29 ENCOUNTER — Encounter: Payer: Self-pay | Admitting: Physician Assistant

## 2016-02-29 ENCOUNTER — Ambulatory Visit (INDEPENDENT_AMBULATORY_CARE_PROVIDER_SITE_OTHER): Payer: Medicare Other | Admitting: Physician Assistant

## 2016-02-29 VITALS — BP 164/92 | HR 76 | Temp 97.9°F | Resp 18 | Wt 145.0 lb

## 2016-02-29 DIAGNOSIS — M25472 Effusion, left ankle: Secondary | ICD-10-CM | POA: Diagnosis not present

## 2016-02-29 NOTE — Progress Notes (Signed)
    Patient ID: Nina Ramos MRN: [id number], DOB: [birth year], 70 y.o. Date of Encounter: 02/29/2016, 2:25 PM    Chief Complaint:  Chief Complaint  Patient presents with  . left foot swelling x 2 weeks     HPI: 70 y.o. year old female presents with above. Says that she has recently noticed some swelling in her left foot / left ankle. Has had no swelling further up in the leg. Has had no significant pain in the ankle or the foot. Has had no erythema or warmth. Has had no trauma or injury to the leg or foot recently.     Home Meds:   No outpatient prescriptions prior to visit.   No facility-administered medications prior to visit.    Allergies: No Known Allergies    Review of Systems: See HPI for pertinent ROS. All other ROS negative.    Physical Exam: Blood pressure 164/92, pulse 76, temperature 97.9 F (36.6 C), temperature source Oral, resp. rate 18, weight 145 lb (65.772 kg)., Body mass index is 24.13 kg/(m^2). General:  WNWD WF. Appears in no acute distress. Neck: Supple. No thyromegaly. No lymphadenopathy. Lungs: Clear bilaterally to auscultation without wheezes, rales, or rhonchi. Breathing is unlabored. Heart: Regular rhythm. No murmurs, rubs, or gallops. Msk:  Strength and tone normal for age. Extremities/Skin: I measured bilateral calves at the level of largest diameter. Measurements equal bilaterally. Also measured ankle level bilaterally.same measurements. No swelling or enlargement of the left calf compared to the right. There is very minimal swelling around the lateral malleolus on the left. Very very minimal swelling of the left foot. There is no erythema. There is no area of tenderness with palpation. Full range of motion intact.  Neuro: Alert and oriented X 3. Moves all extremities spontaneously. Gait is normal. CNII-XII grossly in tact. Psych:  Responds to questions appropriately with a normal affect.     ASSESSMENT AND PLAN:  70 y.o. year old female  with  1. Left ankle swelling She states that at age 70 she had a fracture in that left ankle. I suspect that her current swelling is secondary to arthritis in that region. She states that she does lots of walking and lots of gardening in her yard. Suspect that this has irritated this and has caused some inflammation there. She states that she has noticed that this resolves when she takes ibuprofen. She is reassured. Follow-up PRN.   Murray HodgkinsSigned, Mary Beth HamiltonDixon, [place]PA, [facility] 02/29/2016 2:25 PM

## 2016-03-21 ENCOUNTER — Ambulatory Visit (INDEPENDENT_AMBULATORY_CARE_PROVIDER_SITE_OTHER): Payer: Medicare Other | Admitting: Family Medicine

## 2016-03-21 ENCOUNTER — Encounter: Payer: Self-pay | Admitting: Family Medicine

## 2016-03-21 VITALS — BP 160/94 | HR 80 | Temp 97.8°F | Resp 14 | Ht 64.0 in | Wt 147.0 lb

## 2016-03-21 DIAGNOSIS — M25472 Effusion, left ankle: Secondary | ICD-10-CM | POA: Diagnosis not present

## 2016-03-21 NOTE — Progress Notes (Signed)
   Subjective:    Patient ID: Nina Ramos, female    DOB: May 26, 1946, 70 y.o.   MRN: [id number]  HPI  Patient is due today for a follow-up. She continues to have swelling in her left leg. She has trace bipedal edema. The swelling in the left leg is worse than the right leg. She also has numerous varicosities in the left leg which are much worse than the right leg. She has obvious evidence of chronic venous insufficiency. She states that she has had this for years. Her blood pressure today is extremely high. This is the second time her blood pressure is been high but she states that she is very nervous and doctor's offices and that her blood pressure at home is much better No past medical history on file. No past surgical history on file. Current Outpatient Prescriptions on File Prior to Visit  Medication Sig Dispense Refill  . fluocinolone (VANOS) 0.01 % cream APPLY ON THE SKIN TWICE DAILY  3   No current facility-administered medications on file prior to visit.   No Known Allergies Social History   Social History  . Marital Status: Married    Spouse Name: N/A  . Number of Children: N/A  . Years of Education: N/A   Occupational History  . Not on file.   Social History Main Topics  . Smoking status: Former Games developermoker  . Smokeless tobacco: Former NeurosurgeonUser    Quit date: 02/05/1994  . Alcohol Use: Not on file  . Drug Use: Not on file  . Sexual Activity: Not on file   Other Topics Concern  . Not on file   Social History Narrative     Review of Systems  All other systems reviewed and are negative.      Objective:   Physical Exam  Cardiovascular: Normal rate, regular rhythm and normal heart sounds.   Pulmonary/Chest: Effort normal and breath sounds normal. No respiratory distress. She has no wheezes. She has no rales.  Musculoskeletal: She exhibits edema.  Vitals reviewed.         Assessment & Plan:  Left ankle swelling  I believe her left ankle swelling and left leg  swelling is due more to her chronic venous insufficiency and varicose veins and peripheral edema than due to arthritis. Therefore I recommended that the patient begin wearing knee-high compression hose on a daily basis 15-20 mmHg. I recommended that she be fitted for these. I am very concerned about her blood pressure. I have asked the patient to bring her home blood pressure cuff by sitting we can check it against our blood pressure cuff to determine if it's accurate. If inaccurate, I will start the patient on hydrochlorothiazide to help manage her blood pressure and also hopefully help some with the swelling. Compression hose are not helping with the swelling and the pain in her left leg, I would recommend consultation to vascular surgery

## 2016-03-22 ENCOUNTER — Ambulatory Visit: Payer: Medicare Other | Admitting: Family Medicine

## 2016-03-22 VITALS — BP 144/74 | HR 74

## 2016-03-22 DIAGNOSIS — I10 Essential (primary) hypertension: Secondary | ICD-10-CM

## 2016-03-22 NOTE — Progress Notes (Signed)
Pt came with home BP cuff to have nurse check BP and see machine.  BP was taken in both arms and with machine.  See vital signs section.  Brought BP readings from home ranging from 150/72 (high)  -  132/70 (low).  Explained to pt many things can effect BP.  BP fluctuates all day long depending on activities at that time.  Told to continue to take BP's daily.  Always do first thing in the morning before she has time to get active.  Then can always spot check during the day if feels it may be a problem.  Told her to do this for 2 weeks and then made he a follow up appt with her PCP to review and make recommendations as needed.

## 2016-04-18 ENCOUNTER — Encounter: Payer: Self-pay | Admitting: Family Medicine

## 2016-04-18 ENCOUNTER — Ambulatory Visit (INDEPENDENT_AMBULATORY_CARE_PROVIDER_SITE_OTHER): Payer: Medicare Other | Admitting: Family Medicine

## 2016-04-18 VITALS — BP 128/78 | HR 76 | Temp 97.7°F | Resp 14 | Ht 64.0 in | Wt 145.0 lb

## 2016-04-18 DIAGNOSIS — I872 Venous insufficiency (chronic) (peripheral): Secondary | ICD-10-CM | POA: Diagnosis not present

## 2016-04-18 NOTE — Progress Notes (Signed)
Subjective:    Patient ID: Nina CreeksNancy T Ramos, female    DOB: 1946-01-05, 70 y.o.   MRN: [id number]  HPI  03/21/16 Patient is due today for a follow-up. She continues to have swelling in her left leg. She has trace bipedal edema. The swelling in the left leg is worse than the right leg. She also has numerous varicosities in the left leg which are much worse than the right leg. She has obvious evidence of chronic venous insufficiency. She states that she has had this for years. Her blood pressure today is extremely high. This is the second time her blood pressure is been high but she states that she is very nervous and doctor's offices and that her blood pressure at home is much better.  At that time, my plan was: I believe her left ankle swelling and left leg swelling is due more to her chronic venous insufficiency and varicose veins and peripheral edema than due to arthritis. Therefore I recommended that the patient begin wearing knee-high compression hose on a daily basis 15-20 mmHg. I recommended that she be fitted for these. I am very concerned about her blood pressure. I have asked the patient to bring her home blood pressure cuff by sitting we can check it against our blood pressure cuff to determine if it's accurate. If inaccurate, I will start the patient on hydrochlorothiazide to help manage her blood pressure and also hopefully help some with the swelling. Compression hose are not helping with the swelling and the pain in her left leg, I would recommend consultation to vascular surgery  04/18/16 Patient has been wearing compression hose on a daily basis. This seems to be helping substantially with swelling in her legs. Furthermore she has been checking her blood pressure almost daily. Her blood pressure has been averaging between 120 and 130/50-70. She denies any chest pain shortness of breath or dyspnea on exertion No past medical history on file. No past surgical history on file. Current  Outpatient Prescriptions on File Prior to Visit  Medication Sig Dispense Refill  . fluocinolone (VANOS) 0.01 % cream APPLY ON THE SKIN TWICE DAILY  3   No current facility-administered medications on file prior to visit.   No Known Allergies Social History   Social History  . Marital Status: Married    Spouse Name: N/A  . Number of Children: N/A  . Years of Education: N/A   Occupational History  . Not on file.   Social History Main Topics  . Smoking status: Former Games developermoker  . Smokeless tobacco: Former NeurosurgeonUser    Quit date: 02/05/1994  . Alcohol Use: Not on file  . Drug Use: Not on file  . Sexual Activity: Not on file   Other Topics Concern  . Not on file   Social History Narrative     Review of Systems  All other systems reviewed and are negative.      Objective:   Physical Exam  Cardiovascular: Normal rate, regular rhythm and normal heart sounds.   Pulmonary/Chest: Effort normal and breath sounds normal. No respiratory distress. She has no wheezes. She has no rales.  Musculoskeletal: She exhibits edema.  Vitals reviewed.         Assessment & Plan:  Chronic venous insufficiency  Patient has chronic venous insufficiency. Continue to treat with compression hose as long as this is sufficient. Should the symptoms worsen, consider a consult to vascular surgery. Her blood pressure does not require any medication at the present  time

## 2016-04-27 DIAGNOSIS — H5213 Myopia, bilateral: Secondary | ICD-10-CM | POA: Diagnosis not present

## 2016-10-14 ENCOUNTER — Ambulatory Visit (INDEPENDENT_AMBULATORY_CARE_PROVIDER_SITE_OTHER): Payer: Medicare Other | Admitting: Family Medicine

## 2016-10-14 ENCOUNTER — Encounter: Payer: Self-pay | Admitting: Family Medicine

## 2016-10-14 VITALS — BP 148/78 | HR 88 | Temp 98.0°F | Resp 14 | Ht 64.0 in | Wt 141.0 lb

## 2016-10-14 DIAGNOSIS — M545 Low back pain, unspecified: Secondary | ICD-10-CM

## 2016-10-14 DIAGNOSIS — Z23 Encounter for immunization: Secondary | ICD-10-CM | POA: Diagnosis not present

## 2016-10-14 LAB — URINALYSIS, MICROSCOPIC ONLY
CASTS: NONE SEEN [LPF]
CRYSTALS: NONE SEEN [HPF]
Yeast: NONE SEEN [HPF]

## 2016-10-14 LAB — URINALYSIS, ROUTINE W REFLEX MICROSCOPIC
BILIRUBIN URINE: NEGATIVE
Glucose, UA: NEGATIVE
KETONES UR: NEGATIVE
Leukocytes, UA: NEGATIVE
NITRITE: NEGATIVE
Protein, ur: NEGATIVE
SPECIFIC GRAVITY, URINE: 1.025 (ref 1.001–1.035)
pH: 5.5 (ref 5.0–8.0)

## 2016-10-14 MED ORDER — TRIAMCINOLONE ACETONIDE 0.1 % EX CREA: 1.0000 "application " | TOPICAL_CREAM | Freq: Two times a day (BID) | CUTANEOUS | 2 refills | Status: DC

## 2016-10-14 NOTE — Progress Notes (Signed)
   Subjective:    Patient ID: Nina CreeksNancy T Melichar, female    DOB: 03-04-1946, 70 y.o.   MRN: [id number]  Patient presents for Lower Back Pain (x3 weeks- moved a large flower pot and noted lower back pain- has been using advil for pain)     Patient with back pain for the past 3 weeks. She was moving a very heavy pot which she recently planted at 3 in. I do where she began having some left lower back pain. She denies any radiating symptoms denies any tingling numbness or change in bowel or bladder. She's been taking some Advil which helps.  She was worried it may be her kidneys. She states she gets very anxious and started reading things on the Internet. She denies any pressure denies any dysuria denies any hematuria.    Review Of Systems:  GEN- denies fatigue, fever, weight loss,weakness, recent illness HEENT- denies eye drainage, change in vision, nasal discharge, CVS- denies chest pain, palpitations RESP- denies SOB, cough, wheeze ABD- denies N/V, change in stools, abd pain GU- denies dysuria, hematuria, dribbling, incontinence MSK- + joint pain, muscle aches, injury Neuro- denies headache, dizziness, syncope, seizure activity       Objective:    BP (!) 148/78 (BP Location: Left Arm, Patient Position: Sitting, Cuff Size: Normal)   Pulse 88   Temp 98 F (36.7 C) (Oral)   Resp 14   Ht 5\' 4"  (1.626 m)   Wt 141 lb (64 kg)   SpO2 99%   BMI 24.20 kg/m  GEN- NAD, alert and oriented x3 HEENT- PERRL, EOMI, non injected sclera, pink conjunctiva, MMM, oropharynx clear CVS- RRR, no murmur RESP-CTAB ABD-NABS,soft,NT,ND, no CVA tenderness MSK- Lumbar spine NT, TTP left paraspinals, no spasm noted, neg SLR, fair ROM SPINE/HIPS/KNEES EXT- No edema Pulses- Radial 2+        Assessment & Plan:      Problem List Items Addressed This Visit    None    Visit Diagnoses    Acute left-sided low back pain without sciatica    -  Primary   more musclar strain, no red flags, UA unremarkble, hold  on imaging unless she does not improve, can take NSAIDS, heating pad, epson salt, declines muscle relaxer   Relevant Orders   Urinalysis, Routine w reflex microscopic ([facility])      Note: This dictation was prepared with Dragon dictation along with smaller phrase technology. Any transcriptional errors that result from this process are unintentional.

## 2016-10-14 NOTE — Patient Instructions (Signed)
Use heating pad Epson salt soak Take the advil twice a day with food for next week F/U as needed

## 2016-10-30 ENCOUNTER — Ambulatory Visit (INDEPENDENT_AMBULATORY_CARE_PROVIDER_SITE_OTHER): Payer: Medicare Other | Admitting: Family Medicine

## 2016-10-30 ENCOUNTER — Ambulatory Visit
Admission: RE | Admit: 2016-10-30 | Discharge: 2016-10-30 | Disposition: A | Discharge status: Home / Self Care | Payer: Self-pay | Source: Ambulatory Visit | Attending: Family Medicine | Admitting: Family Medicine

## 2016-10-30 ENCOUNTER — Ambulatory Visit
Admission: RE | Admit: 2016-10-30 | Discharge: 2016-10-30 | Disposition: A | Discharge status: Home / Self Care | Payer: Medicare Other | Source: Ambulatory Visit | Attending: Family Medicine | Admitting: Family Medicine

## 2016-10-30 ENCOUNTER — Encounter: Payer: Self-pay | Admitting: Family Medicine

## 2016-10-30 VITALS — BP 132/74 | HR 86 | Temp 97.9°F | Resp 16 | Ht 64.0 in | Wt 137.0 lb

## 2016-10-30 DIAGNOSIS — M25552 Pain in left hip: Secondary | ICD-10-CM | POA: Diagnosis not present

## 2016-10-30 DIAGNOSIS — M5442 Lumbago with sciatica, left side: Secondary | ICD-10-CM | POA: Diagnosis not present

## 2016-10-30 DIAGNOSIS — S32010A Wedge compression fracture of first lumbar vertebra, initial encounter for closed fracture: Secondary | ICD-10-CM

## 2016-10-30 DIAGNOSIS — M545 Low back pain: Secondary | ICD-10-CM | POA: Diagnosis not present

## 2016-10-30 NOTE — Progress Notes (Signed)
   Subjective:    Patient ID: Nina Ramos, female    DOB: [birth year], 70 y.o.   MRN: [id number]  Patient presents for L Sided Hip Pain (reports muscle strain has improved, but thinks she still has inflammation )  Patient here with ongoing intermittent left-sided lower back pain which occasionally radiates down the left leg to the thigh but she also some pain near her hip. She was concerned as she does not like continue to take anti-inflammatories. She denies any change in bowel or bladder. States it does not hurt all the time in which she takes the Aleve it is much improved and she can do her regular activities. She was last seen on 11/27 she does state that the muscle spasm part is now resolved  Review Of Systems:  GEN- denies fatigue, fever, weight loss,weakness, recent illness HEENT- denies eye drainage, change in vision, nasal discharge, CVS- denies chest pain, palpitations RESP- denies SOB, cough, wheeze ABD- denies N/V, change in stools, abd pain GU- denies dysuria, hematuria, dribbling, incontinence MSK- +joint pain, muscle aches, injury Neuro- denies headache, dizziness, syncope, seizure activity       Objective:    BP 132/74 (BP Location: Left Arm, Patient Position: Sitting, Cuff Size: Normal)   Pulse 86   Temp 97.9 F (36.6 C) (Oral)   Resp 16   Ht 5\' 4"  (1.626 m)   Wt 137 lb (62.1 kg)   SpO2 98%   BMI 23.52 kg/m  ABD-NABS,soft,NT,ND, no CVA tenderness MSK- Lumbar spine NT,, no spasm noted, neg SLR, fair ROM SPINE/HIPS/KNEES Neuro- normal tone LE, strength equal bilat  EXT- No edema Pulses- Radial 2+        Assessment & Plan:      Problem List Items Addressed This Visit    None    Visit Diagnoses    Acute left-sided low back pain with left-sided sciatica    -  Primary   Sent for xray of spine and hip, concern for DDD in spine, possible arthritis in hip, but I think the hip pain is actually radiating pain from back. She does not want any strong  medications, prefers natural Discussed topical voltaren which she has at home and switching to tylenol for pain    Relevant Orders   DG Lumbar Spine Complete (Completed)   Left hip pain       Relevant Orders   DG HIPS BILAT W OR W/O PELVIS 3-4 VIEWS (Completed)   Compression fracture of body of  Lumbar vertebra (HCC)    - ant wedge compression fracture seen on xray at L1, also with DDD, plan for ortho if pt agrees       Note: This dictation was prepared with Dragon dictation along with smaller phrase technology. Any transcriptional errors that result from this process are unintentional.

## 2016-10-30 NOTE — Patient Instructions (Signed)
Get xrays done [address] Take tylenol twice a day  YOu can also try the Voltaren gel F/U pending results

## 2016-11-07 DIAGNOSIS — S32010A Wedge compression fracture of first lumbar vertebra, initial encounter for closed fracture: Secondary | ICD-10-CM | POA: Diagnosis not present

## 2016-11-29 ENCOUNTER — Ambulatory Visit (INDEPENDENT_AMBULATORY_CARE_PROVIDER_SITE_OTHER): Payer: Medicare Other | Admitting: Family Medicine

## 2016-11-29 ENCOUNTER — Encounter: Payer: Self-pay | Admitting: Family Medicine

## 2016-11-29 VITALS — BP 120/80 | HR 90 | Temp 98.7°F | Resp 18 | Ht 64.0 in | Wt 139.0 lb

## 2016-11-29 DIAGNOSIS — R079 Chest pain, unspecified: Secondary | ICD-10-CM

## 2016-11-29 LAB — COMPREHENSIVE METABOLIC PANEL
ALT: 14 U/L (ref 6–29)
AST: 22 U/L (ref 10–35)
Albumin: 4.1 g/dL (ref 3.6–5.1)
Alkaline Phosphatase: 60 U/L (ref 33–130)
BUN: 18 mg/dL (ref 7–25)
CHLORIDE: 103 mmol/L (ref 98–110)
CO2: 30 mmol/L (ref 20–31)
CREATININE: 0.89 mg/dL (ref 0.60–0.93)
Calcium: 9.5 mg/dL (ref 8.6–10.4)
Glucose, Bld: 86 mg/dL (ref 70–99)
Potassium: 4.2 mmol/L (ref 3.5–5.3)
SODIUM: 141 mmol/L (ref 135–146)
Total Bilirubin: 0.6 mg/dL (ref 0.2–1.2)
Total Protein: 6.5 g/dL (ref 6.1–8.1)

## 2016-11-29 LAB — CBC WITH DIFFERENTIAL/PLATELET
BASOS ABS: 0 {cells}/uL (ref 0–200)
Basophils Relative: 0 %
EOS PCT: 1 %
Eosinophils Absolute: 28 cells/uL (ref 15–500)
HCT: 44.3 % (ref 35.0–45.0)
Hemoglobin: 14.3 g/dL (ref 12.0–15.0)
Lymphocytes Relative: 34 %
Lymphs Abs: 952 cells/uL (ref 850–3900)
MCH: 30.8 pg (ref 27.0–33.0)
MCHC: 32.3 g/dL (ref 32.0–36.0)
MCV: 95.3 fL (ref 80.0–100.0)
MONOS PCT: 18 %
MPV: 9.4 fL (ref 7.5–12.5)
Monocytes Absolute: 504 cells/uL (ref 200–950)
NEUTROS ABS: 1316 {cells}/uL — AB (ref 1500–7800)
NEUTROS PCT: 47 %
PLATELETS: 256 10*3/uL (ref 140–400)
RBC: 4.65 MIL/uL (ref 3.80–5.10)
RDW: 14.2 % (ref 11.0–15.0)
WBC: 2.8 10*3/uL — ABNORMAL LOW (ref 3.8–10.8)

## 2016-11-29 LAB — LIPASE: Lipase: 49 U/L (ref 7–60)

## 2016-11-29 NOTE — Progress Notes (Signed)
Subjective:    Patient ID: Nina Ramos, female    DOB: February 01, 1946, 71 y.o.   MRN: [id number]  Patient presents for Intermittent Chest Pain (x1 week- substernal pain- described as sometimes sharp, sometimes dull- no SOB, no radiation, no heartburn- states that she had severe episode last night)  Patient here with intermittent chest pain over the past 2-3 weeks. It was very difficult to describe her chest discomfort. She is planning to her midsternal region but closer to the abdominal area. She states that it will sometimes be a sharp pain sometimes a dull pain or pressure she does not feel any reflux coming up into her throat. She denies actual abdominal pain. She denies any shortness of breath associated with episodes nausea diaphoresis. The tip of the last about 10 seconds and then go away. Over this past week she has been taking some Tums and this has helped but she still feels that after they wear off. She's been able to do her regular activities even taking a walk and it did not have any chest discomfort at that time. She's not had any pain waking her from sleep. He denies any anxiety. States that she had something similar when she was young and she was told it was spasms. He does have a very strict diet  she cannot have anything with coil agrees or will call stomach upset she eats mostly vegetarian. States that this has been going on for many years    Review Of Systems:  GEN- denies fatigue, fever, weight loss,weakness, recent illness HEENT- denies eye drainage, change in vision, nasal discharge, CVS-+ chest pain, palpitations RESP- denies SOB, cough, wheeze ABD- denies N/V, change in stools, abd pain GU- denies dysuria, hematuria, dribbling, incontinence MSK- denies joint pain, muscle aches, injury Neuro- denies headache, dizziness, syncope, seizure activity       Objective:    BP 120/80 (BP Location: Left Arm, Patient Position: Sitting, Cuff Size: Normal)   Pulse 90   Temp 98.7  F (37.1 C) (Oral)   Resp 18   Ht 5\' 4"  (1.626 m)   Wt 139 lb (63 kg)   SpO2 99%   BMI 23.86 kg/m  GEN- NAD, alert and oriented x3 HEENT- PERRL, EOMI, non injected sclera, pink conjunctiva, MMM, oropharynx clear Neck- Supple,  CVS- RRR, no murmur RESP-CTAB ABD-NABS,soft,NT,ND EXT- No edema Pulses- Radial- 2+   EKG-NSR, no ST changes      Assessment & Plan:      Problem List Items Addressed This Visit    None    Visit Diagnoses    Chest pain, unspecified type    -  Primary   Her chest pain is difficult to qualify, Based on history, non exerting qualities may be more GI related. EKG ran twice due to some interference showing ? Depression in lead II but nothing seen on repeat EKG. She has underlying Chronic GI issues, She likely has some gallbladder insufficiency she cannot tolerate heavy meals or fatty meals a states that she would prefer not to have her gallbladder removed. Let us start her on Nexium 20 mg once a day samples given from the office we'll see how she does over the week. I did advise her for chest pain worsened she gets short of breath or other red flags that she needs to go to the emergency room if there is still a chance that this could be her heart.    Relevant Orders   EKG 12-Lead (Completed)  EKG 12-Lead (Completed)   CBC with Differential/Platelet   Comprehensive metabolic panel   Lipase      Note: This dictation was prepared with Dragon dictation along with smaller phrase technology. Any transcriptional errors that result from this process are unintentional.

## 2016-11-29 NOTE — Patient Instructions (Signed)
Nexium once a day  If pain gets worse go to ER We will call with lab results

## 2016-12-02 ENCOUNTER — Ambulatory Visit: Payer: Medicare Other | Admitting: Family Medicine

## 2016-12-11 ENCOUNTER — Encounter: Payer: Self-pay | Admitting: Family Medicine

## 2016-12-11 ENCOUNTER — Ambulatory Visit (INDEPENDENT_AMBULATORY_CARE_PROVIDER_SITE_OTHER): Payer: Medicare Other | Admitting: Family Medicine

## 2016-12-11 VITALS — BP 118/74 | HR 88 | Temp 98.4°F | Resp 14 | Ht 64.0 in | Wt 139.0 lb

## 2016-12-11 DIAGNOSIS — K529 Noninfective gastroenteritis and colitis, unspecified: Secondary | ICD-10-CM | POA: Diagnosis not present

## 2016-12-11 MED ORDER — ONDANSETRON HCL 4 MG PO TABS: 4.0000 mg | ORAL_TABLET | Freq: Three times a day (TID) | ORAL | 0 refills | Status: DC | PRN

## 2016-12-11 NOTE — Progress Notes (Signed)
   Subjective:    Patient ID: Nina Ramos, female    DOB: 11-19-1945, 71 y.o.   MRN: [id number]  Patient presents for Illness (x1 day- abd pain, fever, nausea, HA)   Pt Here with abdominal pain and subjective fever nausea or chills loose stools that started yesterday. Positive sick contacts with grandson who had gastroenteritis. She's not been able to eat since yesterday morning because she feels so nauseous and she had the chills with it. She's not had any blood in the stool she has not had a bowel movement today, had very loose stool yesterday. She has not had any actual emesis. She denies any body aches. She denies any cough or congestion. She was here couple weeks ago with chest discomfort that resolved with use of PPI    Review Of Systems:  GEN- denies fatigue,+fever, weight loss,weakness, recent illness HEENT- denies eye drainage, change in vision, nasal discharge, CVS- denies chest pain, palpitations RESP- denies SOB, cough, wheeze ABD- + N/V, +change in stools,+ abd pain GU- denies dysuria, hematuria, dribbling, incontinence MSK- denies joint pain, muscle aches, injury Neuro- denies headache, dizziness, syncope, seizure activity       Objective:    BP 118/74 (BP Location: Left Arm, Patient Position: Sitting, Cuff Size: Normal)   Pulse 88   Temp 98.4 F (36.9 C) (Oral)   Resp 14   Ht 5\' 4"  (1.626 m)   Wt 139 lb (63 kg)   SpO2 98%   BMI 23.86 kg/m  GEN- NAD, alert and oriented x3 HEENT- PERRL, EOMI, non injected sclera, pink conjunctiva, MMM, oropharynx clear Neck- Supple,  CVS- RRR, no murmur,cap refill < 3 sec  RESP-CTAB ABD-NABS,soft,NT,ND EXT- No edema Pulses- Radial 2+        Assessment & Plan:      Problem List Items Addressed This Visit    None    Visit Diagnoses    Gastroenteritis    -  Primary   Early on in GI bug, more nausea, than true diarrhea. Given zofran, to help keep her hydrated and eating, feels better today compared to yesterday. No  anti-diarrheal needed as this time, call if she worsens      Note: This dictation was prepared with Dragon dictation along with smaller phrase technology. Any transcriptional errors that result from this process are unintentional.

## 2016-12-11 NOTE — Patient Instructions (Signed)
Take the zofran as prescribed F/U as needed

## 2016-12-16 ENCOUNTER — Telehealth: Payer: Self-pay | Admitting: *Deleted

## 2016-12-16 DIAGNOSIS — R1011 Right upper quadrant pain: Secondary | ICD-10-CM

## 2016-12-16 NOTE — Telephone Encounter (Signed)
US ordered.   Patient daughter made aware.

## 2016-12-16 NOTE — Telephone Encounter (Signed)
Okay to order US?

## 2016-12-16 NOTE — Telephone Encounter (Signed)
Patient daughter in office reporting that patient continues to have increased abd pain and nausea after eating.   Advised that patient is getting over GI illness, and abd cramping could last up to 1 week after last sx. States that patient has always had cramping and nausea with food and is requesting US of gallbladder.   MD please advise.

## 2016-12-23 ENCOUNTER — Ambulatory Visit
Admission: RE | Admit: 2016-12-23 | Discharge: 2016-12-23 | Disposition: A | Discharge status: Home / Self Care | Payer: Medicare Other | Source: Ambulatory Visit | Attending: Family Medicine | Admitting: Family Medicine

## 2016-12-23 ENCOUNTER — Other Ambulatory Visit: Payer: Self-pay | Admitting: *Deleted

## 2016-12-23 DIAGNOSIS — K760 Fatty (change of) liver, not elsewhere classified: Secondary | ICD-10-CM

## 2016-12-23 DIAGNOSIS — R1011 Right upper quadrant pain: Secondary | ICD-10-CM

## 2017-03-24 ENCOUNTER — Telehealth: Payer: Self-pay | Admitting: Family Medicine

## 2017-03-24 ENCOUNTER — Ambulatory Visit: Payer: Medicare Other | Admitting: Family Medicine

## 2017-03-24 DIAGNOSIS — M8580 Other specified disorders of bone density and structure, unspecified site: Secondary | ICD-10-CM

## 2017-03-24 DIAGNOSIS — S32010S Wedge compression fracture of first lumbar vertebra, sequela: Secondary | ICD-10-CM

## 2017-03-24 DIAGNOSIS — S32009A Unspecified fracture of unspecified lumbar vertebra, initial encounter for closed fracture: Secondary | ICD-10-CM | POA: Insufficient documentation

## 2017-03-24 NOTE — Telephone Encounter (Signed)
Contacted by [employer]Quality Coord[facility].  Pt with recent history of Lumbar Fracture.  Needs to have Dexa Scan done to assess bone strength.  Order placed and pt given number to [facility] to call and schedule.

## 2017-03-24 NOTE — Telephone Encounter (Signed)
ok 

## 2017-03-31 ENCOUNTER — Ambulatory Visit
Admission: RE | Admit: 2017-03-31 | Discharge: 2017-03-31 | Disposition: A | Discharge status: Home / Self Care | Payer: Medicare Other | Source: Ambulatory Visit | Attending: Family Medicine | Admitting: Family Medicine

## 2017-03-31 DIAGNOSIS — M8580 Other specified disorders of bone density and structure, unspecified site: Secondary | ICD-10-CM

## 2017-03-31 DIAGNOSIS — S32010S Wedge compression fracture of first lumbar vertebra, sequela: Secondary | ICD-10-CM

## 2017-03-31 DIAGNOSIS — M810 Age-related osteoporosis without current pathological fracture: Secondary | ICD-10-CM | POA: Diagnosis not present

## 2017-03-31 DIAGNOSIS — Z13820 Encounter for screening for osteoporosis: Secondary | ICD-10-CM | POA: Diagnosis not present

## 2017-04-01 ENCOUNTER — Encounter: Payer: Self-pay | Admitting: Family Medicine

## 2017-04-01 ENCOUNTER — Other Ambulatory Visit: Payer: Self-pay | Admitting: Family Medicine

## 2017-04-01 DIAGNOSIS — M810 Age-related osteoporosis without current pathological fracture: Secondary | ICD-10-CM | POA: Insufficient documentation

## 2017-04-01 MED ORDER — ALENDRONATE SODIUM 70 MG PO TABS: 70.0000 mg | ORAL_TABLET | ORAL | 11 refills | Status: DC

## 2017-08-25 ENCOUNTER — Encounter: Payer: Self-pay | Admitting: Family Medicine

## 2017-09-01 LAB — FECAL OCCULT BLOOD, GUAIAC: FECAL OCCULT BLD: NEGATIVE

## 2017-09-16 ENCOUNTER — Encounter: Payer: Self-pay | Admitting: *Deleted

## 2017-11-04 IMAGING — US US ABDOMEN LIMITED
1 series · 14 of 25 positions shown · non-contrast
Comparison: None.

CLINICAL DATA: Right upper quadrant pain

EXAM:
US ABDOMEN LIMITED - RIGHT UPPER QUADRANT

[Series 1: us abdomen limited · 0.18mm/px · 14 of 31 slices shown]
[im 1/31]
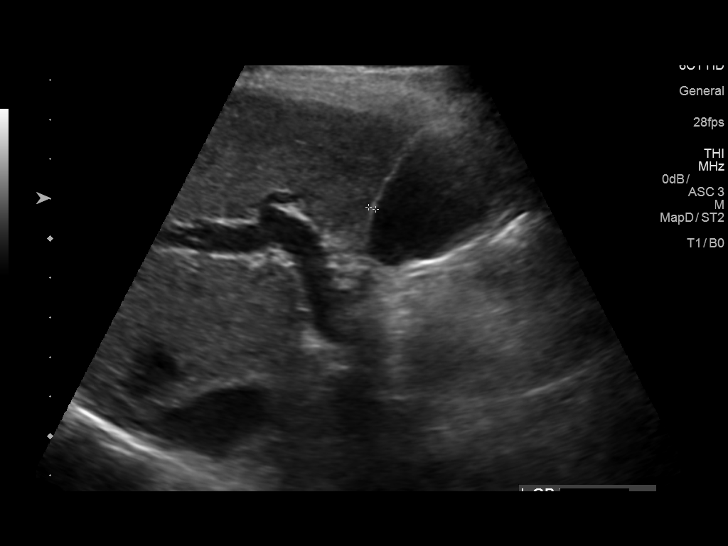
[im 3/31]
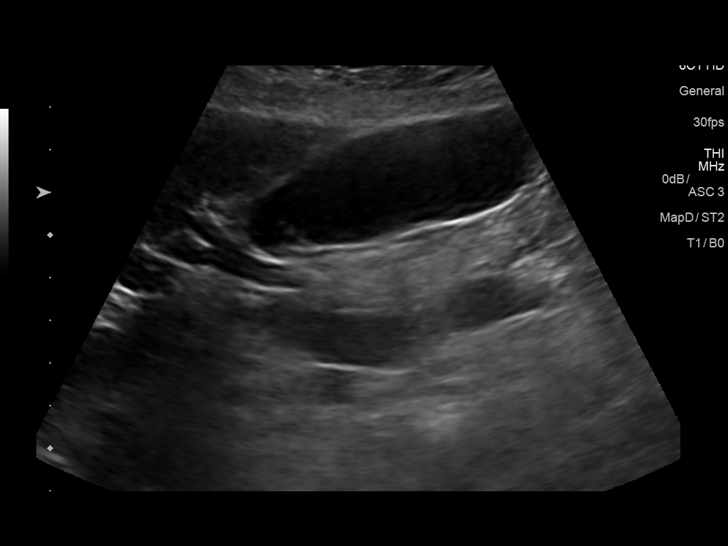
[im 6/31]
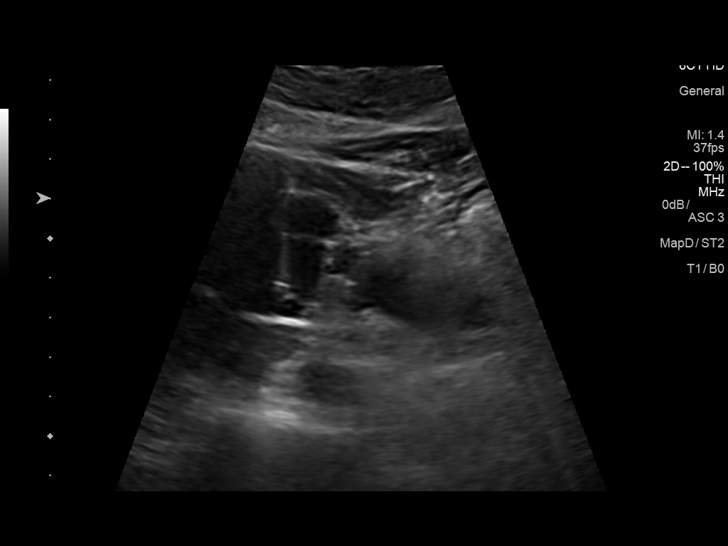
[im 8/31]
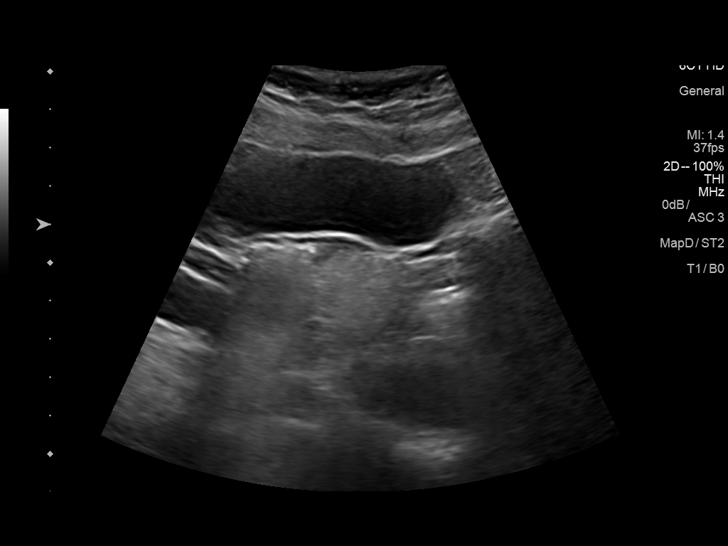
[im 11/31]
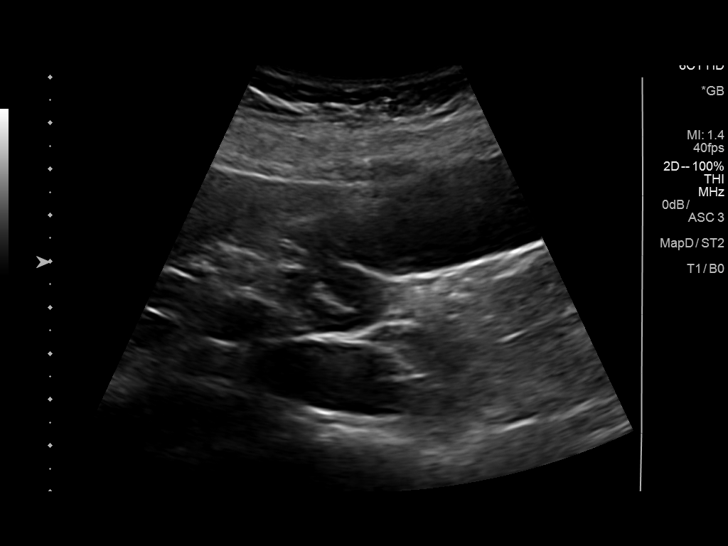
[im 12/31]
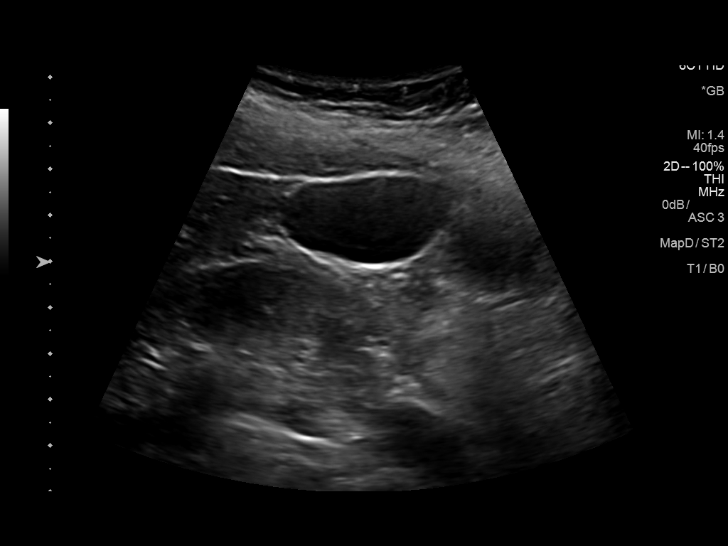
[im 14/31]
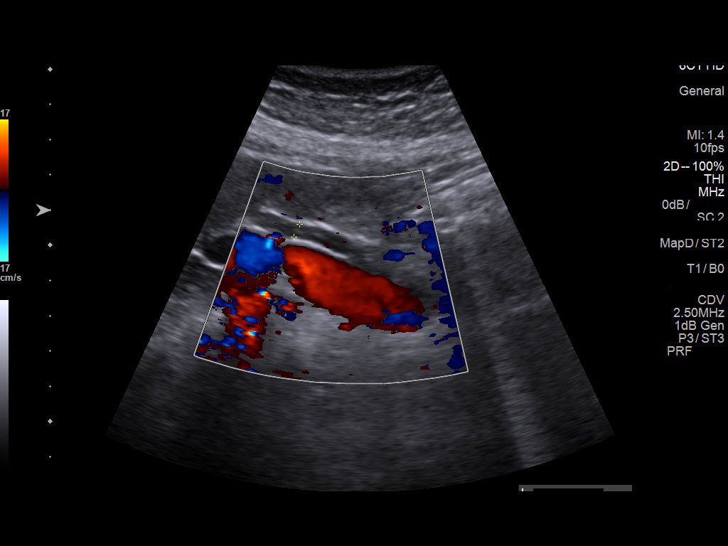
[im 17/31]
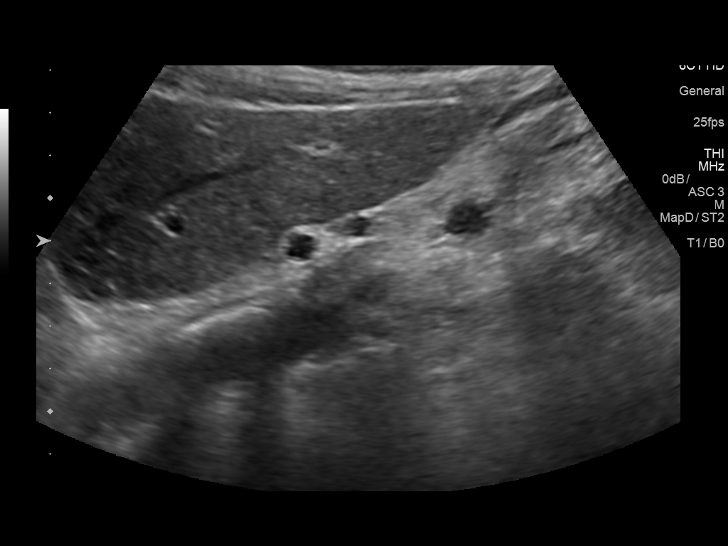
[im 19/31]
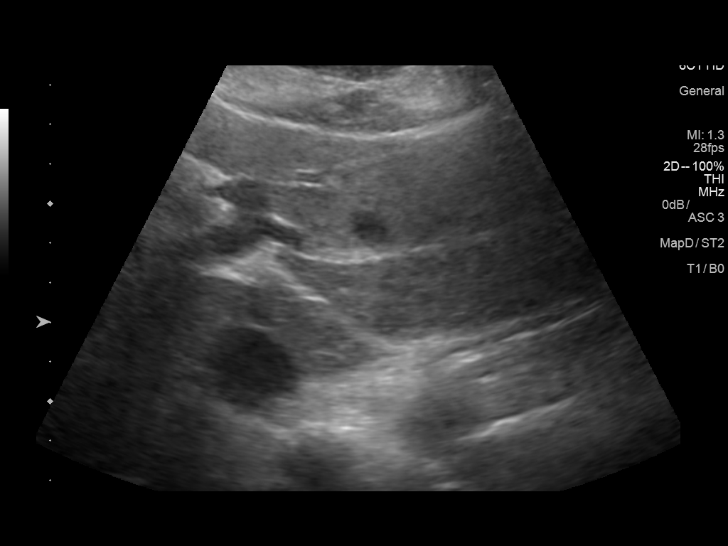
[im 21/31]
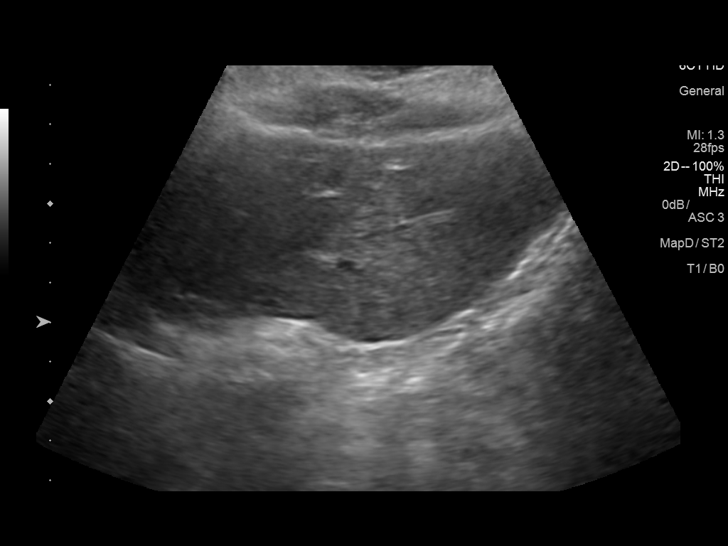
[im 23/31]
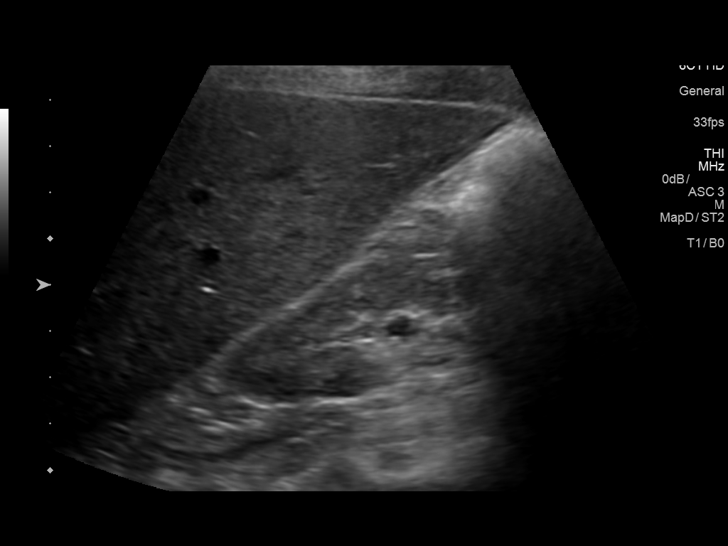
[im 26/31]
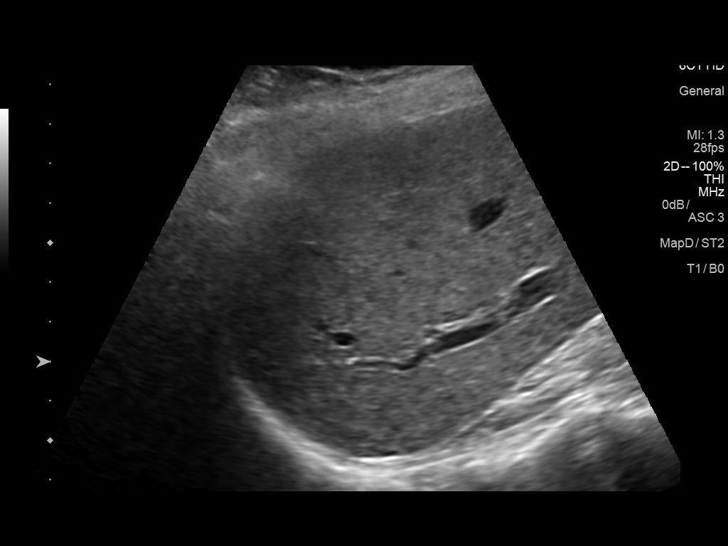
[im 28/31]
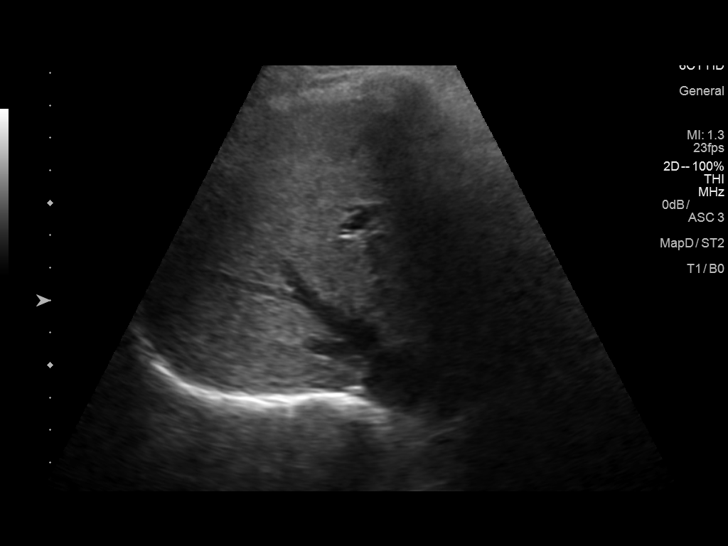
[im 31/31]
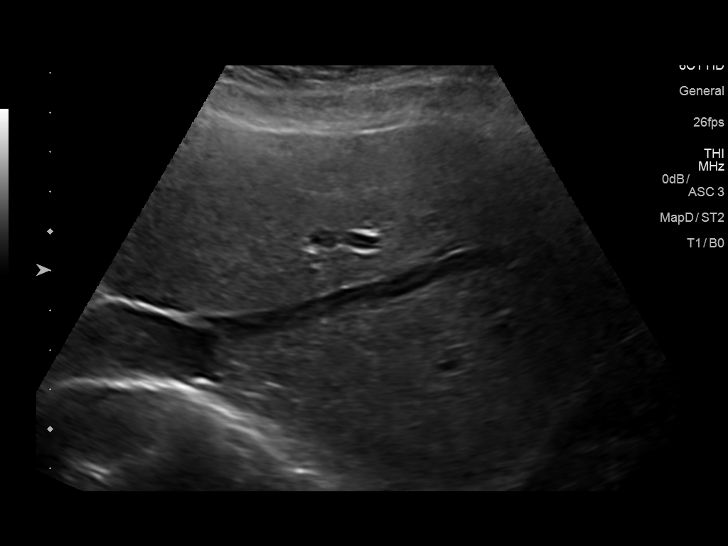

[14 of 25 positions shown; findings below may reference images not displayed]

FINDINGS: Gallbladder:

No gallstones or wall thickening visualized. No sonographic Murphy
sign noted by sonographer.

Common bile duct:

Diameter: 4 mm

Liver:

Mild increased echogenicity is noted which may be related to fatty
infiltration. No focal lesion is seen.
IMPRESSION: Question mild fatty infiltration of the liver. No other focal
abnormality is seen.

## 2018-01-06 ENCOUNTER — Ambulatory Visit (INDEPENDENT_AMBULATORY_CARE_PROVIDER_SITE_OTHER): Payer: Medicare Other

## 2018-01-06 DIAGNOSIS — Z23 Encounter for immunization: Secondary | ICD-10-CM | POA: Diagnosis not present

## 2018-01-06 NOTE — Progress Notes (Signed)
Patient was in office for flu injection. Patient received injection  In her right deltoid. patient tolerated well

## 2018-02-02 ENCOUNTER — Other Ambulatory Visit: Payer: Self-pay | Admitting: Family Medicine

## 2018-02-11 ENCOUNTER — Telehealth: Payer: Self-pay | Admitting: *Deleted

## 2018-02-11 MED ORDER — PROMETHAZINE HCL 25 MG RE SUPP: 25.0000 mg | Freq: Four times a day (QID) | RECTAL | 0 refills | Status: DC | PRN

## 2018-02-11 NOTE — Telephone Encounter (Signed)
okay

## 2018-02-11 NOTE — Telephone Encounter (Signed)
Received call from patient.   Reports that she has been on phenergan suppositories for nausea for a long time and is requesting refill.   Ok to send to pharmacy?

## 2018-02-11 NOTE — Telephone Encounter (Signed)
Prescription sent to pharmacy.

## 2018-02-18 ENCOUNTER — Other Ambulatory Visit: Payer: Self-pay | Admitting: *Deleted

## 2018-02-18 MED ORDER — ALENDRONATE SODIUM 70 MG PO TABS: ORAL_TABLET | ORAL | 3 refills | Status: DC

## 2018-10-05 ENCOUNTER — Encounter: Payer: Self-pay | Admitting: Family Medicine

## 2018-10-05 ENCOUNTER — Ambulatory Visit (INDEPENDENT_AMBULATORY_CARE_PROVIDER_SITE_OTHER): Payer: Medicare Other | Admitting: Family Medicine

## 2018-10-05 ENCOUNTER — Other Ambulatory Visit: Payer: Self-pay

## 2018-10-05 VITALS — BP 156/80 | HR 70 | Temp 98.1°F | Resp 14 | Ht 64.0 in | Wt 142.0 lb

## 2018-10-05 DIAGNOSIS — I10 Essential (primary) hypertension: Secondary | ICD-10-CM

## 2018-10-05 DIAGNOSIS — I872 Venous insufficiency (chronic) (peripheral): Secondary | ICD-10-CM

## 2018-10-05 DIAGNOSIS — L280 Lichen simplex chronicus: Secondary | ICD-10-CM | POA: Diagnosis not present

## 2018-10-05 HISTORY — DX: Lichen simplex chronicus: L28.0

## 2018-10-05 MED ORDER — METHYLPREDNISOLONE 4 MG PO TBPK: ORAL_TABLET | ORAL | 0 refills | Status: DC

## 2018-10-05 MED ORDER — HYDROCHLOROTHIAZIDE 25 MG PO TABS: 25.0000 mg | ORAL_TABLET | Freq: Every day | ORAL | 3 refills | Status: DC

## 2018-10-05 MED ORDER — FLUOCINOLONE ACETONIDE 0.01 % EX CREA: TOPICAL_CREAM | CUTANEOUS | 3 refills | Status: DC

## 2018-10-05 MED ORDER — TRIAMCINOLONE ACETONIDE 0.1 % EX CREA: 1.0000 "application " | TOPICAL_CREAM | Freq: Two times a day (BID) | CUTANEOUS | 2 refills | Status: DC

## 2018-10-05 NOTE — Patient Instructions (Addendum)
HCTZ once  A day in the morning for blood pressure Get the steroid cream  We will call with lab results Take the steroid pills  F/U 2 weeks Fasting

## 2018-10-05 NOTE — Progress Notes (Signed)
   Subjective:    Patient ID: Nina Ramos, female    DOB: [birth year], 72 y.o.   MRN: [id number]  Patient presents for High Blood Pressure [facility](HH nurse checked BP today and noted around 160); Dermatitis (itching on foot and BLE); and Foot Issues (reports "Spongy" feeling to B feet in the morning that improves throughout the day)   Pt here with multiple complaints, her last visit was in Jan 2018. elevated blood pressures She is concerned that her feet feel spongy especially in the area of her lichenoid dermatitis.  She also has history of peripheral insufficiency she has varicose veins and spider veins in her lower extremities but states that she has not noted any significant swelling in her feet.  She had does randomly check her blood pressure and it has been elevated back-and-forth states it also gives that when she gets very anxious.  She was feeling fine when she had her husband's home health nurse check her blood pressure was elevated the 160s systolic.  She has been following it since then and has been in the 130s to 150s at home.  She denies any chest pain shortness of breath no headaches no dizzy spells no change in vision.  She has 2 creams for her dermatitis but one is very expensive ,oral steroids have worked in the past   Review Of Systems:  GEN- denies fatigue, fever, weight loss,weakness, recent illness HEENT- denies eye drainage, change in vision, nasal discharge, CVS- denies chest pain, palpitations RESP- denies SOB, cough, wheeze ABD- denies N/V, change in stools, abd pain GU- denies dysuria, hematuria, dribbling, incontinence MSK- denies joint pain, muscle aches, injury Neuro- denies headache, dizziness, syncope, seizure activity       Objective:    BP (!) 156/80   Pulse 70   Temp 98.1 F (36.7 C) (Oral)   Resp 14   Ht 5\' 4"  (1.626 m)   Wt 142 lb (64.4 kg)   SpO2 97%   BMI 24.37 kg/m  GEN- NAD, alert and oriented x3 HEENT- PERRL, EOMI, non injected sclera, pink  conjunctiva, MMM, oropharynx clear Neck- Supple, no thyromegaly, no JVD CVS- RRR, no murmur RESP-CTAB ABD-NABS,soft,NT,ND EXT- mild localized edema near erythematous circular plaques on feet/ankle Pulses- Radial, DP- 2+        Assessment & Plan:      Problem List Items Addressed This Visit      Unprioritized   Chronic venous insufficiency    Mild swelling near her inflammed dermatitis but also varicose and spider veins With elevated BP, start HCTZ Check her labs      Relevant Medications   hydrochlorothiazide (HYDRODIURIL) 25 MG tablet   HTN (hypertension) - Primary    START HCTZ 12.5mg  daily Dicussed medication Recheck in a few weeks      Relevant Medications   hydrochlorothiazide (HYDRODIURIL) 25 MG tablet   Other Relevant Orders   CBC with Differential/Platelet (Completed)   Comprehensive metabolic panel (Completed)   TSH (Completed)   Lichenoid dermatitis    Will give oral steroids to help control She will price her topical steroids again         Note: This dictation was prepared with Dragon dictation along with smaller phrase technology. Any transcriptional errors that result from this process are unintentional.

## 2018-10-06 ENCOUNTER — Encounter: Payer: Self-pay | Admitting: Family Medicine

## 2018-10-06 DIAGNOSIS — I10 Essential (primary) hypertension: Secondary | ICD-10-CM | POA: Insufficient documentation

## 2018-10-06 LAB — CBC WITH DIFFERENTIAL/PLATELET
BASOS PCT: 0.7 %
Basophils Absolute: 42 cells/uL (ref 0–200)
EOS PCT: 14.3 %
Eosinophils Absolute: 858 cells/uL — ABNORMAL HIGH (ref 15–500)
HCT: 41.2 % (ref 35.0–45.0)
Hemoglobin: 14.2 g/dL (ref 11.7–15.5)
Lymphs Abs: 1260 cells/uL (ref 850–3900)
MCH: 31.6 pg (ref 27.0–33.0)
MCHC: 34.5 g/dL (ref 32.0–36.0)
MCV: 91.6 fL (ref 80.0–100.0)
MONOS PCT: 10.9 %
MPV: 9.7 fL (ref 7.5–12.5)
Neutro Abs: 3186 cells/uL (ref 1500–7800)
Neutrophils Relative %: 53.1 %
Platelets: 257 10*3/uL (ref 140–400)
RBC: 4.5 10*6/uL (ref 3.80–5.10)
RDW: 12.8 % (ref 11.0–15.0)
TOTAL LYMPHOCYTE: 21 %
WBC mixed population: 654 cells/uL (ref 200–950)
WBC: 6 10*3/uL (ref 3.8–10.8)

## 2018-10-06 LAB — COMPREHENSIVE METABOLIC PANEL
AG RATIO: 1.9 (calc) (ref 1.0–2.5)
ALKALINE PHOSPHATASE (APISO): 36 U/L (ref 33–130)
ALT: 15 U/L (ref 6–29)
AST: 21 U/L (ref 10–35)
Albumin: 4.2 g/dL (ref 3.6–5.1)
BUN: 25 mg/dL (ref 7–25)
CALCIUM: 9 mg/dL (ref 8.6–10.4)
CO2: 25 mmol/L (ref 20–32)
Chloride: 103 mmol/L (ref 98–110)
Creat: 0.76 mg/dL (ref 0.60–0.93)
GLUCOSE: 94 mg/dL (ref 65–99)
Globulin: 2.2 g/dL (calc) (ref 1.9–3.7)
Potassium: 4.1 mmol/L (ref 3.5–5.3)
Sodium: 138 mmol/L (ref 135–146)
Total Bilirubin: 0.4 mg/dL (ref 0.2–1.2)
Total Protein: 6.4 g/dL (ref 6.1–8.1)

## 2018-10-06 LAB — TSH: TSH: 3.36 m[IU]/L (ref 0.40–4.50)

## 2018-10-06 NOTE — Assessment & Plan Note (Signed)
Will give oral steroids to help control She will price her topical steroids again

## 2018-10-06 NOTE — Assessment & Plan Note (Signed)
Mild swelling near her inflammed dermatitis but also varicose and spider veins With elevated BP, start HCTZ Check her labs

## 2018-10-06 NOTE — Assessment & Plan Note (Signed)
START HCTZ 12.5mg  daily Dicussed medication Recheck in a few weeks

## 2018-10-19 ENCOUNTER — Ambulatory Visit (INDEPENDENT_AMBULATORY_CARE_PROVIDER_SITE_OTHER): Payer: Medicare Other | Admitting: Family Medicine

## 2018-10-19 ENCOUNTER — Encounter: Payer: Self-pay | Admitting: Family Medicine

## 2018-10-19 VITALS — BP 150/74 | HR 90 | Temp 97.6°F | Resp 15 | Ht 64.0 in | Wt 139.4 lb

## 2018-10-19 DIAGNOSIS — G629 Polyneuropathy, unspecified: Secondary | ICD-10-CM

## 2018-10-19 DIAGNOSIS — L280 Lichen simplex chronicus: Secondary | ICD-10-CM | POA: Diagnosis not present

## 2018-10-19 DIAGNOSIS — I872 Venous insufficiency (chronic) (peripheral): Secondary | ICD-10-CM

## 2018-10-19 DIAGNOSIS — I10 Essential (primary) hypertension: Secondary | ICD-10-CM

## 2018-10-19 DIAGNOSIS — Z23 Encounter for immunization: Secondary | ICD-10-CM

## 2018-10-19 HISTORY — DX: Polyneuropathy, unspecified: G62.9

## 2018-10-19 MED ORDER — GABAPENTIN 100 MG PO CAPS: 100.0000 mg | ORAL_CAPSULE | Freq: Every day | ORAL | 3 refills | Status: DC

## 2018-10-19 NOTE — Patient Instructions (Addendum)
Flu shot  And pneumonia shot given  Continue the HCTZ 1/2 tablet  We will call with lab results Gabapentin 100mg  at bedtime for your feet  F/u 3 MONTHS Medicare physical

## 2018-10-19 NOTE — Progress Notes (Signed)
Subjective:    Patient ID: Nina Ramos, female  [name]  DOB: 1946/11/16, 72 y.o.   MRN: [id number]  Patient presents for Hypertension (Patient has concerns of Neuropathy in feet. Had c/o some leg cramps. Patient would also like to know if she should take  claritin for rash)  She here for intermittent follow-up.  She was seen a week secondary to some swelling in her feet hypertension.  She was started on hydrochlorothiazide 12.5 mg once a day.  She is also due for fasting panel today.  Her metabolic panel CBC and thyroid were normal When she took her blood pressure at home 117-120's/ 70-80's She often takes her blood pressure 3 or 4 times a day.  She states that it has been much better since she has been on a new medication.  But she does get very anxious when she comes to the doctor's office Her weight is down 2 pounds since her last visit she states that she has been eating normally even more for the holidays.  The swelling in her feet has gone down however she still feels a sensation on the bottom of her feet that feels uneasy.  Does not have tingling or burning sensation she cannot quite describe it but she states it feels like neuropathy.  She is tried B12 over-the-counter for months that has not improved as she would like to try gabapentin  She does have known osteoarthritis in her spine but she denies any radiating symptoms from her back.  Lichen dermatitis- she took the oral steroids and used the cream, her flare is much improved now  she did have cramps in her legs, she used some OTC foam for cramps She does gets cramps  Asked about claritin if she needed to take this   Flu  Shot to be  Done       Review Of Systems:  GEN- denies fatigue, fever, weight loss,weakness, recent illness HEENT- denies eye drainage, change in vision, nasal discharge, CVS- denies chest pain, palpitations RESP- denies SOB, cough, wheeze ABD- denies N/V, change in stools, abd pain GU- denies dysuria,  hematuria, dribbling, incontinence MSK- denies joint pain, muscle aches, injury Neuro- denies headache, dizziness, syncope, seizure activity       Objective:    BP (!) 150/74   Pulse 90   Temp 97.6 F (36.4 C) (Oral)   Resp 15   Ht 5\' 4"  (1.626 m)   Wt 139 lb 6 oz (63.2 kg)   SpO2 98%   BMI 23.92 kg/m  GEN- NAD, alert and oriented x3 HEENT- PERRL, EOMI, non injected sclera, pink conjunctiva, MMM, oropharynx clear Neck- Supple, no thyromegaly CVS- RRR, no murmur RESP-CTAB ABD-NABS,soft,NT,ND EXT- No edema,varicose veins Neuro- normal monofilament bilat , normal tone LE, sensation grossly in tact MSK- Fair ROM back/hips, neg SLR  Pulses- Radial, DP- 2+        Assessment & Plan:      Problem List Items Addressed This Visit      Unprioritized   Chronic venous insufficiency   HTN (hypertension) - Primary    BP still elevated but very anxious in the office  Home readings have improved as they were previously elevated, so will continue HCTZ 12.5mg  daily Advised no need to check her BP multiple times a day as this makes her more anxious Screening cholesterol done       Relevant Orders   Lipid panel   Lichenoid dermatitis    Flare is much improved She  does not need claritin at this time      Neuropathy    She would like to try medication before further work up She is not diabetic No sign of nerve impingment from lumbar spine Will try gabapentin 100mg  at bedtime Next step- nerve conduction         Other Visit Diagnoses    Need for influenza vaccination       Relevant Orders   Pneumococcal conjugate vaccine 13-valent   Flu vaccine HIGH DOSE PF (Fluzone High dose)      Note: This dictation was prepared with Dragon dictation along with smaller phrase technology. Any transcriptional errors that result from this process are unintentional.

## 2018-10-19 NOTE — Assessment & Plan Note (Signed)
She would like to try medication before further work up She is not diabetic No sign of nerve impingment from lumbar spine Will try gabapentin 100mg  at bedtime Next step- nerve conduction

## 2018-10-19 NOTE — Assessment & Plan Note (Signed)
Flare is much improved She does not need claritin at this time

## 2018-10-19 NOTE — Assessment & Plan Note (Signed)
BP still elevated but very anxious in the office  Home readings have improved as they were previously elevated, so will continue HCTZ 12.5mg  daily Advised no need to check her BP multiple times a day as this makes her more anxious Screening cholesterol done

## 2018-10-20 LAB — LIPID PANEL
CHOL/HDL RATIO: 4.5 (calc) (ref <5.0)
Cholesterol: 215 mg/dL — ABNORMAL HIGH (ref <200)
HDL: 48 mg/dL — AB (ref >50)
LDL Cholesterol (Calc): 141 mg/dL (calc) — ABNORMAL HIGH
Non-HDL Cholesterol (Calc): 167 mg/dL (calc) — ABNORMAL HIGH (ref <130)
TRIGLYCERIDES: 131 mg/dL (ref <150)

## 2018-10-21 ENCOUNTER — Other Ambulatory Visit: Payer: Self-pay | Admitting: *Deleted

## 2018-10-21 ENCOUNTER — Encounter: Payer: Self-pay | Admitting: *Deleted

## 2018-10-21 MED ORDER — SIMVASTATIN 20 MG PO TABS: 20.0000 mg | ORAL_TABLET | Freq: Every day | ORAL | 3 refills | Status: DC

## 2018-12-03 ENCOUNTER — Other Ambulatory Visit: Payer: Self-pay | Admitting: Family Medicine

## 2019-01-15 ENCOUNTER — Other Ambulatory Visit: Payer: Self-pay | Admitting: *Deleted

## 2019-01-15 MED ORDER — SIMVASTATIN 20 MG PO TABS: 20.0000 mg | ORAL_TABLET | Freq: Every day | ORAL | 3 refills | Status: DC

## 2019-01-15 MED ORDER — ALENDRONATE SODIUM 70 MG PO TABS: ORAL_TABLET | ORAL | 3 refills | Status: DC

## 2019-01-15 MED ORDER — HYDROCHLOROTHIAZIDE 25 MG PO TABS: 25.0000 mg | ORAL_TABLET | Freq: Every day | ORAL | 1 refills | Status: DC

## 2019-01-15 MED ORDER — GABAPENTIN 100 MG PO CAPS: 100.0000 mg | ORAL_CAPSULE | Freq: Every day | ORAL | 3 refills | Status: DC

## 2019-01-23 ENCOUNTER — Other Ambulatory Visit: Payer: Self-pay | Admitting: Family Medicine

## 2019-03-09 ENCOUNTER — Other Ambulatory Visit: Payer: Self-pay | Admitting: *Deleted

## 2019-03-09 MED ORDER — FLUOCINOLONE ACETONIDE 0.01 % EX CREA: TOPICAL_CREAM | CUTANEOUS | 3 refills | Status: DC

## 2019-04-09 ENCOUNTER — Other Ambulatory Visit: Payer: Self-pay | Admitting: Family Medicine

## 2019-04-09 MED ORDER — TRIAMCINOLONE ACETONIDE 0.1 % EX CREA: 1.0000 "application " | TOPICAL_CREAM | Freq: Two times a day (BID) | CUTANEOUS | 2 refills | Status: DC

## 2019-06-10 ENCOUNTER — Other Ambulatory Visit: Payer: Self-pay | Admitting: Family Medicine

## 2019-07-22 ENCOUNTER — Ambulatory Visit (INDEPENDENT_AMBULATORY_CARE_PROVIDER_SITE_OTHER): Payer: Medicare Other

## 2019-07-22 ENCOUNTER — Other Ambulatory Visit: Payer: Self-pay

## 2019-07-22 DIAGNOSIS — Z23 Encounter for immunization: Secondary | ICD-10-CM

## 2019-07-22 NOTE — Progress Notes (Signed)
Patient came in today to receive her annual flu vaccine. Fluad was given in the left deltoid. Patient tolerated well. VIS was given 

## 2019-09-14 ENCOUNTER — Other Ambulatory Visit: Payer: Self-pay | Admitting: Family Medicine

## 2019-09-23 DIAGNOSIS — H5203 Hypermetropia, bilateral: Secondary | ICD-10-CM | POA: Diagnosis not present

## 2019-10-21 ENCOUNTER — Other Ambulatory Visit: Payer: Self-pay | Admitting: Family Medicine

## 2019-10-21 DIAGNOSIS — Z1231 Encounter for screening mammogram for malignant neoplasm of breast: Secondary | ICD-10-CM

## 2019-11-21 ENCOUNTER — Other Ambulatory Visit: Payer: Self-pay | Admitting: Family Medicine

## 2019-11-24 DIAGNOSIS — H40003 Preglaucoma, unspecified, bilateral: Secondary | ICD-10-CM | POA: Diagnosis not present

## 2019-11-30 ENCOUNTER — Encounter: Payer: Self-pay | Admitting: Family Medicine

## 2019-11-30 ENCOUNTER — Ambulatory Visit (INDEPENDENT_AMBULATORY_CARE_PROVIDER_SITE_OTHER): Payer: Medicare Other | Admitting: Family Medicine

## 2019-11-30 DIAGNOSIS — J309 Allergic rhinitis, unspecified: Secondary | ICD-10-CM | POA: Diagnosis not present

## 2019-11-30 DIAGNOSIS — G629 Polyneuropathy, unspecified: Secondary | ICD-10-CM

## 2019-11-30 DIAGNOSIS — E785 Hyperlipidemia, unspecified: Secondary | ICD-10-CM

## 2019-11-30 DIAGNOSIS — I10 Essential (primary) hypertension: Secondary | ICD-10-CM

## 2019-11-30 DIAGNOSIS — M816 Localized osteoporosis [Lequesne]: Secondary | ICD-10-CM | POA: Diagnosis not present

## 2019-11-30 DIAGNOSIS — L280 Lichen simplex chronicus: Secondary | ICD-10-CM

## 2019-11-30 DIAGNOSIS — I872 Venous insufficiency (chronic) (peripheral): Secondary | ICD-10-CM | POA: Diagnosis not present

## 2019-11-30 NOTE — Progress Notes (Signed)
Virtual Visit via Telephone Note  I connected with Nina Ramos on 11/30/19 at 11:49am by telephone and verified that I am speaking with the correct person using two identifiers.      Pt location: at home   Physician location:  In office, [facility], Milinda Antis MD     On call: patient and physician   I discussed the limitations, risks, security and privacy concerns of performing an evaluation and management service by telephone and the availability of in person appointments. I also discussed with the patient that there may be a patient responsible charge related to this service. The patient expressed understanding and agreed to proceed.   History of Present Illness:  Telehealth visit to review medications   HTN/ venous insuffiency -patient currently on hydrochlorothiazide 12.5 mg once a day.  Her blood pressure at home runs  No SE with the medications. No known CAD/Heart disease  Sunday 120/61  Hr  74  , lowest down 109 systolic , she has not been checking her BP regulary She has had some pressures in her eyes, she has family history brother ? Glaucoma, she had 2 eye exams told nothing wrong  She sometimes feels a little swimmy headed ,she does get runny nose She has not had any swelling in her ankles    Hyperlipidemia- last set of labs done in Dec 2019, due for repeat lipid panel and LFT, currently on zocor 20mg    Osteoporosis- last bone density May 2018 , on fosamax once a week, due for repeat bone density , she has history of vertebral fracture, she is not not on calcium and vitamin D    Lichenoid dermatitis- uses topical steroid as needed     Neuropathy- gabapentin 100mg  at bedtime    Observations/Objective: NAD, unable to observe over phone   Assessment and Plan:  HTN-she is having some dizzy spells and some lower blood pressure readings no problems with fluid retention will DC the 12.5 mg of hydrochlorothiazide and have her monitor her blood pressure.   If her blood pressure stays above 140/90 she will contact me.  She will also contact me if she notices fluid retention.  Hyperlipidemia-continue statin drug.  She will come in for fasting labs.  Osteoporosis-continue Fosamax we will plan for bone density in about 6 months the setting of COVID-19.  She is to add in calcium and vitamin D 1200 g/1000 international unit  Neuropathy-continue gabapentin 100 mg at bedtime she states that this is controlling her pain for now.  Lichenoid dermatitis continue topical steroid  sHe is also having some allergy-like symptoms with the pressure around the eyes and the nasal drainage we will have her try antihistamine as well.  Follow Up Instructions: Pt to come in for fasting labs    I discussed the assessment and treatment plan with the patient. The patient was provided an opportunity to ask questions and all were answered. The patient agreed with the plan and demonstrated an understanding of the instructions.   The patient was advised to call back or seek an in-person evaluation if the symptoms worsen or if the condition fails to improve as anticipated.  I provided 15 minutes of non-face-to-face time during this encounter. End Time : 12:04pm  June 2018, MD

## 2019-12-02 ENCOUNTER — Other Ambulatory Visit: Payer: Self-pay

## 2019-12-02 ENCOUNTER — Other Ambulatory Visit: Payer: Medicare Other

## 2019-12-02 DIAGNOSIS — I10 Essential (primary) hypertension: Secondary | ICD-10-CM

## 2019-12-02 LAB — CBC WITH DIFFERENTIAL/PLATELET
Absolute Monocytes: 365 cells/uL (ref 200–950)
Basophils Absolute: 10 cells/uL (ref 0–200)
Basophils Relative: 0.4 %
Eosinophils Absolute: 50 cells/uL (ref 15–500)
Eosinophils Relative: 2.1 %
HCT: 42.1 % (ref 35.0–45.0)
Hemoglobin: 14.2 g/dL (ref 11.7–15.5)
Lymphs Abs: 1126 cells/uL (ref 850–3900)
MCH: 32 pg (ref 27.0–33.0)
MCHC: 33.7 g/dL (ref 32.0–36.0)
MCV: 94.8 fL (ref 80.0–100.0)
MPV: 10 fL (ref 7.5–12.5)
Monocytes Relative: 15.2 %
Neutro Abs: 850 cells/uL — ABNORMAL LOW (ref 1500–7800)
Neutrophils Relative %: 35.4 %
Platelets: 210 10*3/uL (ref 140–400)
RBC: 4.44 10*6/uL (ref 3.80–5.10)
RDW: 12.8 % (ref 11.0–15.0)
Total Lymphocyte: 46.9 %
WBC: 2.4 10*3/uL — ABNORMAL LOW (ref 3.8–10.8)

## 2019-12-03 ENCOUNTER — Other Ambulatory Visit: Payer: Self-pay | Admitting: Family Medicine

## 2019-12-03 LAB — COMPREHENSIVE METABOLIC PANEL
AG Ratio: 2.2 (calc) (ref 1.0–2.5)
ALT: 14 U/L (ref 6–29)
AST: 19 U/L (ref 10–35)
Albumin: 4.2 g/dL (ref 3.6–5.1)
Alkaline phosphatase (APISO): 36 U/L — ABNORMAL LOW (ref 37–153)
BUN: 23 mg/dL (ref 7–25)
CO2: 29 mmol/L (ref 20–32)
Calcium: 9 mg/dL (ref 8.6–10.4)
Chloride: 105 mmol/L (ref 98–110)
Creat: 0.93 mg/dL (ref 0.60–0.93)
Globulin: 1.9 g/dL (calc) (ref 1.9–3.7)
Glucose, Bld: 96 mg/dL (ref 65–99)
Potassium: 4.1 mmol/L (ref 3.5–5.3)
Sodium: 141 mmol/L (ref 135–146)
Total Bilirubin: 0.7 mg/dL (ref 0.2–1.2)
Total Protein: 6.1 g/dL (ref 6.1–8.1)

## 2019-12-03 LAB — LIPID PANEL
Cholesterol: 153 mg/dL (ref <200)
HDL: 55 mg/dL (ref >=50)
LDL Cholesterol (Calc): 82 mg/dL (calc)
Non-HDL Cholesterol (Calc): 98 mg/dL (calc) (ref <130)
Total CHOL/HDL Ratio: 2.8 (calc) (ref <5.0)
Triglycerides: 77 mg/dL (ref <150)

## 2019-12-07 ENCOUNTER — Other Ambulatory Visit: Payer: Self-pay | Admitting: *Deleted

## 2019-12-07 ENCOUNTER — Telehealth: Payer: Self-pay | Admitting: *Deleted

## 2019-12-07 DIAGNOSIS — D72829 Elevated white blood cell count, unspecified: Secondary | ICD-10-CM

## 2019-12-07 MED ORDER — HYDROCHLOROTHIAZIDE 25 MG PO TABS: 12.5000 mg | ORAL_TABLET | Freq: Every day | ORAL | 0 refills | Status: DC | PRN

## 2019-12-07 NOTE — Telephone Encounter (Signed)
    I had advised pt in OV to take oral anti-histamine and she can use the nasal saline  I would not use HCTZ every day, only if significant swelling, otherwise just elevate feet, too much medication will make her more dizzy

## 2019-12-07 NOTE — Telephone Encounter (Signed)
Received call from patient.   Reports that she has slight edema around her L ankle. Reports that it is not as "bony" as the other ankle. Inquired as to if she should resume HCTZ.   Discussed with MD and was advised that medication was D/C'ed D/T dizziness and some low blood pressures. Advised that if she truly has swelling, she can take 1/2 tab PRN.   Call placed to patient and patient made aware. States that she feels her dizziness was stemming from some sinus congestion. States that she feels that she is very "stopped up". Advised to use OTC Nasal Saline for sinus congestion. Advised to call back if other Sx occur.

## 2019-12-20 ENCOUNTER — Other Ambulatory Visit: Payer: Self-pay | Admitting: Family Medicine

## 2020-02-10 ENCOUNTER — Other Ambulatory Visit: Payer: Self-pay | Admitting: Family Medicine

## 2020-10-06 ENCOUNTER — Other Ambulatory Visit: Payer: Self-pay | Admitting: Family Medicine

## 2020-12-17 ENCOUNTER — Other Ambulatory Visit: Payer: Self-pay | Admitting: Family Medicine

## 2021-01-09 ENCOUNTER — Other Ambulatory Visit: Payer: Self-pay | Admitting: Family Medicine

## 2021-01-10 ENCOUNTER — Other Ambulatory Visit: Payer: Self-pay | Admitting: Family Medicine

## 2021-02-07 DIAGNOSIS — H40013 Open angle with borderline findings, low risk, bilateral: Secondary | ICD-10-CM | POA: Diagnosis not present

## 2021-04-03 ENCOUNTER — Other Ambulatory Visit: Payer: Self-pay | Admitting: Family Medicine

## 2021-06-10 ENCOUNTER — Other Ambulatory Visit: Payer: Self-pay | Admitting: Family Medicine

## 2021-07-27 DIAGNOSIS — H903 Sensorineural hearing loss, bilateral: Secondary | ICD-10-CM | POA: Diagnosis not present

## 2021-07-27 DIAGNOSIS — H9313 Tinnitus, bilateral: Secondary | ICD-10-CM | POA: Diagnosis not present

## 2021-07-27 DIAGNOSIS — H6123 Impacted cerumen, bilateral: Secondary | ICD-10-CM | POA: Diagnosis not present

## 2021-08-09 DIAGNOSIS — H40013 Open angle with borderline findings, low risk, bilateral: Secondary | ICD-10-CM | POA: Diagnosis not present

## 2021-08-22 ENCOUNTER — Other Ambulatory Visit: Payer: Self-pay | Admitting: *Deleted

## 2021-08-22 MED ORDER — ALENDRONATE SODIUM 70 MG PO TABS: ORAL_TABLET | ORAL | 0 refills | Status: DC

## 2021-11-22 ENCOUNTER — Other Ambulatory Visit: Payer: Self-pay

## 2021-12-20 ENCOUNTER — Other Ambulatory Visit: Payer: Self-pay

## 2021-12-25 ENCOUNTER — Other Ambulatory Visit: Payer: Self-pay

## 2022-01-15 ENCOUNTER — Ambulatory Visit: Payer: Medicare Other | Admitting: Family Medicine

## 2022-01-17 ENCOUNTER — Telehealth: Payer: Self-pay

## 2022-01-17 NOTE — Telephone Encounter (Signed)
Pharmacy faxed refill request for  ? ?simvastatin (ZOCOR) 20 MG tablet [[id number]]  ?  Order Details ?Dose, Route, Frequency: As Directed  ?Dispense Quantity: 90 tablet Refills: 3   ?Note to Pharmacy: Requesting 1 year supply  ?     ?Sig: TAKE 1 TABLET BY MOUTH AT  BEDTIME  ?     ?Start Date: 12/18/20 End Date: --  ?Written Date: 12/18/20 Expiration Date: 12/18/21  ? ?

## 2022-01-17 NOTE — Telephone Encounter (Signed)
Refill refused. Patient has not been seen since 2021. Will need OV for further refills.  ?

## 2022-01-18 ENCOUNTER — Telehealth: Payer: Self-pay | Admitting: Family Medicine

## 2022-01-18 NOTE — Telephone Encounter (Signed)
Received eFax from pharmacy to request refill of simvastatin (ZOCOR) 20 MG tablet [id number]  ? ?Pharmacy: ? ?[facility] ([place], [place]  ?[address], [place] [address]  ?Phone:  [phone]  Fax:  [phone]  ? ?Please advise.  ?

## 2022-01-18 NOTE — Telephone Encounter (Signed)
At this time ALL rx refills will be REFUSED.  ?Patient has not been seen in office since 2021. ? ?Please call to schedule OV. Once scheduled she may receive 30day supply of medications until appointment.  ?

## 2022-01-18 NOTE — Telephone Encounter (Signed)
Received eFax from pharmacy requesting refill of ? ?gabapentin (NEURONTIN) 100 MG capsule ? ? ?Pharmacy fax received from: ?  ?[facility]) [place], [place] - [address] [facility] [place]  ?[address] [place] [address], [place] [address]  ?Phone:  [phone]  Fax:  [phone]  ? ?Please advise. ?

## 2022-01-21 NOTE — Telephone Encounter (Signed)
Patient's daughter called back and scheduled an appointment for this Thursday for refills. Patient has about 7 days worth of medication left.  ? ? ?

## 2022-01-21 NOTE — Telephone Encounter (Signed)
Outbound call placed to inform patient of previous note. Patient made aware she'd be able to receive a 30 day supply after scheduling an appointment. In response, patient stated she will find a different provider. ?

## 2022-01-22 MED ORDER — GABAPENTIN 100 MG PO CAPS: 100.0000 mg | ORAL_CAPSULE | Freq: Every day | ORAL | 0 refills | Status: DC

## 2022-01-22 MED ORDER — SIMVASTATIN 20 MG PO TABS: 20.0000 mg | ORAL_TABLET | Freq: Every day | ORAL | 0 refills | Status: DC

## 2022-01-22 NOTE — Telephone Encounter (Signed)
30D supply sent to pharmacy.  ?

## 2022-01-23 ENCOUNTER — Other Ambulatory Visit: Payer: Self-pay

## 2022-01-23 MED ORDER — GABAPENTIN 100 MG PO CAPS: 100.0000 mg | ORAL_CAPSULE | Freq: Every day | ORAL | 0 refills | Status: DC

## 2022-01-23 MED ORDER — SIMVASTATIN 20 MG PO TABS: 20.0000 mg | ORAL_TABLET | Freq: Every day | ORAL | 0 refills | Status: DC

## 2022-01-23 NOTE — Telephone Encounter (Signed)
Received eFAX from pharmacy to request medical clarification. Fax says quantity written for gabapentin (NEURONTIN) 100 MG capsule [[id number]]  is less than what the insurance benefit will allow. Patient's state of permanent residence requires provider's approval to increase the dispense quantity. ? ?Please fax to [phone], or call [phone]. ? ? ?

## 2022-01-23 NOTE — Telephone Encounter (Signed)
Additional fax received from pharmacy to request the same medical clarification for simvastatin (ZOCOR) 20 MG tablet [[id number]]  ? ?Please advise pharmacist.  ?

## 2022-01-23 NOTE — Telephone Encounter (Signed)
Rx's updated to 90d supply.  ? ?Patient must keep scheduled appt tomorrow to receive any further refills.  ?

## 2022-01-24 ENCOUNTER — Encounter: Payer: Self-pay | Admitting: Family Medicine

## 2022-01-24 ENCOUNTER — Other Ambulatory Visit: Payer: Self-pay

## 2022-01-24 ENCOUNTER — Ambulatory Visit (INDEPENDENT_AMBULATORY_CARE_PROVIDER_SITE_OTHER): Payer: Medicare Other | Admitting: Family Medicine

## 2022-01-24 VITALS — BP 148/68 | HR 92 | Temp 97.2°F | Resp 18 | Ht 64.0 in | Wt 135.0 lb

## 2022-01-24 DIAGNOSIS — E785 Hyperlipidemia, unspecified: Secondary | ICD-10-CM | POA: Diagnosis not present

## 2022-01-24 DIAGNOSIS — Z1231 Encounter for screening mammogram for malignant neoplasm of breast: Secondary | ICD-10-CM | POA: Diagnosis not present

## 2022-01-24 DIAGNOSIS — I10 Essential (primary) hypertension: Secondary | ICD-10-CM | POA: Diagnosis not present

## 2022-01-24 DIAGNOSIS — M810 Age-related osteoporosis without current pathological fracture: Secondary | ICD-10-CM | POA: Diagnosis not present

## 2022-01-24 NOTE — Progress Notes (Signed)
? ?Subjective:  ? ? Patient ID: Nina Ramos, female    DOB: Jul 18, 1946, 76 y.o.   MRN: [id number] ? ?HPI ? ? ?Patient is a very pleasant 76 year old Caucasian female who has not been seen in quite some time.  She is overdue for mammogram.  She is overdue for a bone density test as she does have a history of osteoporosis.  Her last mammogram seems to be more than 10 years ago.  Her last bone density test seems to be more than 5 years ago.  She has never had a colonoscopy and she declines this.  She has a history of hypertension.  She states she gets nervous in the doctor's office and her blood pressure is elevated today at 148/68.  She denies any chest pain shortness of breath or dyspnea on exertion.  She is due for fasting lab work.  She also has a history of neuropathy.  She takes gabapentin 100 mg p.o. nightly and this seems to work well for her. ?Past Medical History:  ?Diagnosis Date  ? Chronic venous insufficiency   ? Hyperlipidemia   ? Hypertension   ? Osteoporosis   ? ? ?History reviewed. No pertinent surgical history. ?Current Outpatient Medications on File Prior to Visit  ?Medication Sig Dispense Refill  ? alendronate (FOSAMAX) 70 MG tablet Take with a full glass of water on an empty stomach. 12 tablet 0  ? fluocinolone (VANOS) 0.01 % cream APPLY ON THE SKIN TWICE DAILY 30 g 3  ? gabapentin (NEURONTIN) 100 MG capsule Take 1 capsule (100 mg total) by mouth at bedtime. 90 capsule 0  ? hydrochlorothiazide (HYDRODIURIL) 25 MG tablet TAKE ONE-HALF TABLET BY  MOUTH DAILY 45 tablet 0  ? promethazine (PHENERGAN) 25 MG suppository Place 1 suppository (25 mg total) rectally every 6 (six) hours as needed for nausea or vomiting. 12 each 0  ? simvastatin (ZOCOR) 20 MG tablet Take 1 tablet (20 mg total) by mouth at bedtime. 90 tablet 0  ? triamcinolone cream (KENALOG) 0.1 % Apply 1 application topically 2 (two) times daily. 453.6 g 2  ? ?No current facility-administered medications on file prior to visit.  ? ?Social  History  ? ?Socioeconomic History  ? Marital status: Married  ?  Spouse name: Not on file  ? Number of children: Not on file  ? Years of education: Not on file  ? Highest education level: Not on file  ?Occupational History  ? Not on file  ?Tobacco Use  ? Smoking status: Former  ? Smokeless tobacco: Former  ?  Quit date: 02/05/1994  ?Substance and Sexual Activity  ? Alcohol use: Not on file  ? Drug use: Not on file  ? Sexual activity: Not on file  ?Other Topics Concern  ? Not on file  ?Social History Narrative  ? Not on file  ? ?Social Determinants of Health  ? ?Financial Resource Strain: Not on file  ?Food Insecurity: Not on file  ?Transportation Needs: Not on file  ?Physical Activity: Not on file  ?Stress: Not on file  ?Social Connections: Not on file  ?Intimate Partner Violence: Not on file  ? ?Family History  ?Problem Relation Age of Onset  ? Asthma Mother   ? Heart disease Father   ? ? ? ?Review of Systems  ?All other systems reviewed and are negative. ? ?   ?Objective:  ? Physical Exam ?Constitutional:   ?   General: She is not in acute distress. ?   Appearance: Normal  appearance. She is normal weight. She is not ill-appearing or toxic-appearing.  ?Cardiovascular:  ?   Rate and Rhythm: Normal rate and regular rhythm.  ?   Heart sounds: Normal heart sounds. No murmur heard. ?  No friction rub. No gallop.  ?Pulmonary:  ?   Effort: Pulmonary effort is normal. No respiratory distress.  ?   Breath sounds: Normal breath sounds. No stridor. No wheezing or rales.  ?Abdominal:  ?   General: Bowel sounds are normal. There is no distension.  ?   Palpations: Abdomen is soft. There is no mass.  ?   Tenderness: There is no abdominal tenderness. There is no guarding.  ?Musculoskeletal:  ?   Right lower leg: No edema.  ?   Left lower leg: No edema.  ?Neurological:  ?   Mental Status: She is alert.  ? ? ?Patient has numerous varicose veins all over her legs and mild trace edema in both ankles ? ? ?   ?Assessment & Plan:   ? ?Benign essential HTN - Plan: CBC with Differential/Platelet, Lipid panel, COMPLETE METABOLIC PANEL WITH GFR ? ?Osteoporosis, unspecified osteoporosis type, unspecified pathological fracture presence - Plan: DG Bone Density ? ?Encounter for screening mammogram for malignant neoplasm of breast - Plan: MM Digital Diagnostic Bilat ? ?Primary hypertension ? ?Hyperlipidemia, unspecified hyperlipidemia type ?I was able to talk the patient into a mammogram as well as a bone density test.  I will schedule both of these.  She declines a colonoscopy.  Her blood pressure today is elevated.  Of asked the patient to check her blood pressure at home and notify me of the values over the next week.  If persistently elevated I would increase hydrochlorothiazide to 25 mg a day.  I will check a CBC, CMP, and a fasting lipid panel.  Ideally I like her LDL cholesterol to be below 100. ?

## 2022-01-25 LAB — CBC WITH DIFFERENTIAL/PLATELET
Absolute Monocytes: 624 cells/uL (ref 200–950)
Basophils Absolute: 10 cells/uL (ref 0–200)
Basophils Relative: 0.2 %
Eosinophils Absolute: 53 cells/uL (ref 15–500)
Eosinophils Relative: 1.1 %
HCT: 41.3 % (ref 35.0–45.0)
Hemoglobin: 13.7 g/dL (ref 11.7–15.5)
Lymphs Abs: 1464 cells/uL (ref 850–3900)
MCH: 31.5 pg (ref 27.0–33.0)
MCHC: 33.2 g/dL (ref 32.0–36.0)
MCV: 94.9 fL (ref 80.0–100.0)
MPV: 9.7 fL (ref 7.5–12.5)
Monocytes Relative: 13 %
Neutro Abs: 2650 cells/uL (ref 1500–7800)
Neutrophils Relative %: 55.2 %
Platelets: 281 10*3/uL (ref 140–400)
RBC: 4.35 10*6/uL (ref 3.80–5.10)
RDW: 12.4 % (ref 11.0–15.0)
Total Lymphocyte: 30.5 %
WBC: 4.8 10*3/uL (ref 3.8–10.8)

## 2022-01-25 LAB — LIPID PANEL
Cholesterol: 135 mg/dL (ref <200)
HDL: 48 mg/dL — ABNORMAL LOW (ref >=50)
LDL Cholesterol (Calc): 68 mg/dL (calc)
Non-HDL Cholesterol (Calc): 87 mg/dL (calc) (ref <130)
Total CHOL/HDL Ratio: 2.8 (calc) (ref <5.0)
Triglycerides: 100 mg/dL (ref <150)

## 2022-01-25 LAB — COMPLETE METABOLIC PANEL WITH GFR
AG Ratio: 1.8 (calc) (ref 1.0–2.5)
ALT: 56 U/L — ABNORMAL HIGH (ref 6–29)
AST: 44 U/L — ABNORMAL HIGH (ref 10–35)
Albumin: 4.3 g/dL (ref 3.6–5.1)
Alkaline phosphatase (APISO): 48 U/L (ref 37–153)
BUN/Creatinine Ratio: 23 (calc) — ABNORMAL HIGH (ref 6–22)
BUN: 23 mg/dL (ref 7–25)
CO2: 32 mmol/L (ref 20–32)
Calcium: 9.4 mg/dL (ref 8.6–10.4)
Chloride: 102 mmol/L (ref 98–110)
Creat: 1.02 mg/dL — ABNORMAL HIGH (ref 0.60–1.00)
Globulin: 2.4 g/dL (calc) (ref 1.9–3.7)
Glucose, Bld: 106 mg/dL — ABNORMAL HIGH (ref 65–99)
Potassium: 3.9 mmol/L (ref 3.5–5.3)
Sodium: 141 mmol/L (ref 135–146)
Total Bilirubin: 0.5 mg/dL (ref 0.2–1.2)
Total Protein: 6.7 g/dL (ref 6.1–8.1)
eGFR: 57 mL/min/{1.73_m2} — ABNORMAL LOW (ref >=60)

## 2022-02-08 ENCOUNTER — Ambulatory Visit
Admission: RE | Admit: 2022-02-08 | Discharge: 2022-02-08 | Disposition: A | Discharge status: Home / Self Care | Payer: Medicare Other | Source: Ambulatory Visit | Attending: Family Medicine | Admitting: Family Medicine

## 2022-02-08 ENCOUNTER — Other Ambulatory Visit: Payer: Self-pay

## 2022-02-08 DIAGNOSIS — Z1231 Encounter for screening mammogram for malignant neoplasm of breast: Secondary | ICD-10-CM

## 2022-03-26 ENCOUNTER — Other Ambulatory Visit: Payer: Self-pay | Admitting: Family Medicine

## 2022-03-27 NOTE — Telephone Encounter (Signed)
Requested too early. LRF both meds 01/23/22  #90  0 refills. Sent via Interface. ?Requested Prescriptions  ?Pending Prescriptions Disp Refills  ?? gabapentin (NEURONTIN) 100 MG capsule [Pharmacy Med Name: Gabapentin 100 MG Oral Capsule] 90 capsule 3  ?  Sig: TAKE 1 CAPSULE BY MOUTH AT  BEDTIME  ?  ? Neurology: Anticonvulsants - gabapentin Failed - 03/26/2022  7:43 AM  ?  ?  Failed - Cr in normal range and within 360 days  ?  Creat  ?Date Value Ref Range Status  ?01/24/2022 1.02 (H) 0.60 - 1.00 mg/dL Final  ?   ?  ?  Failed - Completed PHQ-2 or PHQ-9 in the last 360 days  ?  ?  Passed - Valid encounter within last 12 months  ?  Recent Outpatient Visits   ?      ? 2 months ago Benign essential HTN  ? [facility] [place], Cammie Mcgee, MD  ? 2 years ago Essential hypertension  ? [place], Modena Nunnery, MD  ? 3 years ago Essential hypertension  ? [place], Modena Nunnery, MD  ? 3 years ago Essential hypertension  ? [place], Modena Nunnery, MD  ? 5 years ago Gastroenteritis  ? [facility] [place], Modena Nunnery, MD  ?  ?  ? ?  ?  ?  ?? simvastatin (ZOCOR) 20 MG tablet [Pharmacy Med Name: Simvastatin 20 MG Oral Tablet] 90 tablet 3  ?  Sig: TAKE 1 TABLET BY MOUTH AT  BEDTIME  ?  ? Cardiovascular:  Antilipid - Statins Failed - 03/26/2022  7:43 AM  ?  ?  Failed - Lipid Panel in normal range within the last 12 months  ?  Cholesterol  ?Date Value Ref Range Status  ?01/24/2022 135 <200 mg/dL Final  ? ?LDL Cholesterol (Calc)  ?Date Value Ref Range Status  ?01/24/2022 68 mg/dL (calc) Final  ?  Comment:  ?  Reference range: <100 ?Marland Kitchen ?Desirable range <100 mg/dL for primary prevention;   ?<70 mg/dL for patients with CHD or diabetic patients  ?with > or = 2 CHD risk factors. ?. ?LDL-C is now calculated using the Martin-Hopkins  ?calculation, which is a validated novel method providing  ?better accuracy than the Friedewald equation in the   ?estimation of LDL-C.  ?Cresenciano Genre et al. Annamaria Helling. MU:[id number]): 2061-2068  ?(http://education.[employer].com/faq/FAQ164) ?  ? ?HDL  ?Date Value Ref Range Status  ?01/24/2022 48 (L) > OR = 50 mg/dL Final  ? ?Triglycerides  ?Date Value Ref Range Status  ?01/24/2022 100 <150 mg/dL Final  ? ?  ?  ?  Passed - Patient is not pregnant  ?  ?  Passed - Valid encounter within last 12 months  ?  Recent Outpatient Visits   ?      ? 2 months ago Benign essential HTN  ? [facility] [place], Cammie Mcgee, MD  ? 2 years ago Essential hypertension  ? [place], Modena Nunnery, MD  ? 3 years ago Essential hypertension  ? [place], Modena Nunnery, MD  ? 3 years ago Essential hypertension  ? [place], Modena Nunnery, MD  ? 5 years ago Gastroenteritis  ? [place], Modena Nunnery, MD  ?  ?  ? ?  ?  ?  ? ? ?

## 2022-04-07 ENCOUNTER — Emergency Department (HOSPITAL_BASED_OUTPATIENT_CLINIC_OR_DEPARTMENT_OTHER): Payer: Medicare Other | Admitting: Radiology

## 2022-04-07 ENCOUNTER — Emergency Department (HOSPITAL_BASED_OUTPATIENT_CLINIC_OR_DEPARTMENT_OTHER)
Admission: EM | Admit: 2022-04-07 | Discharge: 2022-04-07 | Disposition: A | Discharge status: Home / Self Care | Payer: Medicare Other | Attending: Emergency Medicine | Admitting: Emergency Medicine

## 2022-04-07 ENCOUNTER — Other Ambulatory Visit: Payer: Self-pay

## 2022-04-07 ENCOUNTER — Encounter (HOSPITAL_BASED_OUTPATIENT_CLINIC_OR_DEPARTMENT_OTHER): Payer: Self-pay | Admitting: Emergency Medicine

## 2022-04-07 DIAGNOSIS — M79605 Pain in left leg: Secondary | ICD-10-CM | POA: Diagnosis not present

## 2022-04-07 DIAGNOSIS — M5431 Sciatica, right side: Secondary | ICD-10-CM | POA: Insufficient documentation

## 2022-04-07 DIAGNOSIS — M25551 Pain in right hip: Secondary | ICD-10-CM | POA: Diagnosis not present

## 2022-04-07 DIAGNOSIS — M79651 Pain in right thigh: Secondary | ICD-10-CM | POA: Insufficient documentation

## 2022-04-07 DIAGNOSIS — Z79899 Other long term (current) drug therapy: Secondary | ICD-10-CM | POA: Diagnosis not present

## 2022-04-07 DIAGNOSIS — M545 Low back pain, unspecified: Secondary | ICD-10-CM | POA: Diagnosis not present

## 2022-04-07 MED ORDER — LIDOCAINE 5 % EX PTCH: 1.0000 | MEDICATED_PATCH | CUTANEOUS | 0 refills | Status: DC

## 2022-04-07 NOTE — Discharge Instructions (Addendum)
If you develop worsening, recurrent, or continued back pain, numbness or weakness in the legs, incontinence of your bowels or bladders, numbness of your buttocks, fever, abdominal pain, or any other new/concerning symptoms then return to the ER for evaluation.  

## 2022-04-07 NOTE — ED Provider Notes (Signed)
[facility] EMERGENCY DEPT Provider Note   CSN: [id number] Arrival date & time: 04/07/22  1504     History  Chief Complaint  Patient presents with   Tailbone Pain    Nina Ramos is a 76 y.o. female.  HPI 76 year old female presents with leg/back pain.  She has been having pain for about 3 weeks.  She got a new battery-operated lawnmower and states that after this she started noticing some left leg pain.  She then noticed that went away but now there is pain in her right buttocks.  This is primarily when she walks.  It is hard for her to walk and she walks with a limp.  No direct trauma.  No urinary or bowel incontinence.  She has chronic neuropathy to her legs but no new weakness or numbness.  She has been taking some Advil.  Home Medications Prior to Admission medications   Medication Sig Start Date End Date Taking? Authorizing Provider  lidocaine (LIDODERM) 5 % Place 1 patch onto the skin daily. Remove & Discard patch within 12 hours or as directed by MD 04/07/22  Yes Pricilla Loveless, MD  alendronate (FOSAMAX) 70 MG tablet Take with a full glass of water on an empty stomach. 08/22/21   Donita Brooks, MD  fluocinolone (VANOS) 0.01 % cream APPLY ON THE SKIN TWICE DAILY 03/09/19   Salley Scarlet, MD  gabapentin (NEURONTIN) 100 MG capsule Take 1 capsule (100 mg total) by mouth at bedtime. 01/23/22   Donita Brooks, MD  hydrochlorothiazide (HYDRODIURIL) 25 MG tablet TAKE ONE-HALF TABLET BY  MOUTH DAILY 04/04/21   Donita Brooks, MD  promethazine (PHENERGAN) 25 MG suppository Place 1 suppository (25 mg total) rectally every 6 (six) hours as needed for nausea or vomiting. 02/11/18   Salley Scarlet, MD  simvastatin (ZOCOR) 20 MG tablet Take 1 tablet (20 mg total) by mouth at bedtime. 01/23/22   Donita Brooks, MD  triamcinolone cream (KENALOG) 0.1 % Apply 1 application topically 2 (two) times daily. 04/09/19   Salley Scarlet, MD      Allergies    Patient has no  known allergies.    Review of Systems   Review of Systems  Musculoskeletal:  Positive for arthralgias and back pain.  Neurological:  Positive for numbness (chronic). Negative for weakness.   Physical Exam Updated Vital Signs BP 137/72   Pulse 78   Temp 98.6 F (37 C)   Resp 16   SpO2 99%  Physical Exam Vitals and nursing note reviewed.  Constitutional:      Appearance: She is well-developed.  HENT:     Head: Normocephalic and atraumatic.  Cardiovascular:     Rate and Rhythm: Normal rate and regular rhythm.     Pulses:          Dorsalis pedis pulses are 2+ on the right side and 2+ on the left side.  Pulmonary:     Effort: Pulmonary effort is normal.  Abdominal:     General: There is no distension.     Palpations: Abdomen is soft.     Tenderness: There is no abdominal tenderness.  Musculoskeletal:     Lumbar back: No tenderness.     Right hip: No tenderness. Normal range of motion.     Left hip: No tenderness. Normal range of motion.       Legs:  Skin:    General: Skin is warm and dry.  Neurological:  Mental Status: She is alert.     Deep Tendon Reflexes:     Reflex Scores:      Patellar reflexes are 2+ on the right side and 2+ on the left side.      Achilles reflexes are 2+ on the right side and 2+ on the left side.    Comments: 5/5 strength in BLE. Grossly normal sensation    ED Results / Procedures / Treatments   Labs (all labs ordered are listed, but only abnormal results are displayed) Labs Reviewed - No data to display  EKG None  Radiology DG Lumbar Spine Complete  Result Date: 04/07/2022 CLINICAL DATA:  Right buttock pain.  Back pain EXAM: LUMBAR SPINE - COMPLETE 4+ VIEW COMPARISON:  10/30/2016 FINDINGS: There is no evidence of acute lumbar spine fracture. Chronic superior endplate compression fracture of L1, unchanged. Alignment is normal. Mild disc height loss at L4-5 and L5-S1. Mild lower lumbar facet arthropathy. Bones are demineralized.  Abdominal aortic atherosclerosis. IMPRESSION: 1. No acute lumbar spine fracture. 2. Chronic superior endplate compression fracture of L1. 3. Mild lower lumbar degenerative changes. Electronically Signed   By: Davina Poke D.O.   On: 04/07/2022 20:07   DG Hip Unilat W or Wo Pelvis 2-3 Views Right  Result Date: 04/07/2022 CLINICAL DATA:  Right buttock and back pain EXAM: DG HIP (WITH OR WITHOUT PELVIS) 2-3V RIGHT COMPARISON:  10/30/2016 FINDINGS: There is no evidence of hip fracture or dislocation. There is no evidence of arthropathy or other focal bone abnormality. IMPRESSION: Negative. Electronically Signed   By: Merilyn Baba M.D.   On: 04/07/2022 20:06    Procedures Procedures    Medications Ordered in ED Medications - No data to display  ED Course/ Medical Decision Making/ A&P                           Medical Decision Making Amount and/or Complexity of Data Reviewed External Data Reviewed: notes. Radiology: ordered and independent interpretation performed.  Risk Prescription drug management.   Xrays obtained and I have personally viewed/interpreted the images. No fractures/dislocation of hip or back.  No neurovascular compromise on exam.  She has been ambulatory but is just painful.  Sounds like is probably sciatica.  She does not want a thing stronger than ibuprofen or Tylenol for pain.  She does want to try it numbing patch and I discussed this may not work given the nature of her pain but we can try a Lidoderm patch.  My suspicion for intra-abdominal or retroperitoneal emergency or CNS emergency such as cauda equina is quite low.  I think she is stable for discharge home to follow-up with PCP and can discuss other options such as outpatient MRI and physical therapy.        Final Clinical Impression(s) / ED Diagnoses Final diagnoses:  Right sided sciatica    Rx / DC Orders ED Discharge Orders          Ordered    lidocaine (LIDODERM) 5 %  Every 24 hours         04/07/22 2112              Sherwood Gambler, MD 04/07/22 2113

## 2022-04-07 NOTE — ED Triage Notes (Signed)
Pt was having left leg pain for about 2 weeks, thought it would pass. Now has moved to her left hip and sacrum and hard to walk.

## 2022-04-08 ENCOUNTER — Ambulatory Visit: Payer: Medicare Other | Admitting: Family Medicine

## 2022-04-09 ENCOUNTER — Encounter: Payer: Self-pay | Admitting: Family Medicine

## 2022-04-09 ENCOUNTER — Ambulatory Visit (INDEPENDENT_AMBULATORY_CARE_PROVIDER_SITE_OTHER): Payer: Medicare Other | Admitting: Family Medicine

## 2022-04-09 VITALS — BP 142/90 | Ht 64.0 in | Wt 138.4 lb

## 2022-04-09 DIAGNOSIS — R350 Frequency of micturition: Secondary | ICD-10-CM | POA: Diagnosis not present

## 2022-04-09 DIAGNOSIS — M5432 Sciatica, left side: Secondary | ICD-10-CM | POA: Diagnosis not present

## 2022-04-09 LAB — URINALYSIS, ROUTINE W REFLEX MICROSCOPIC
Glucose, UA: NEGATIVE
Hgb urine dipstick: NEGATIVE
Leukocytes,Ua: NEGATIVE
Nitrite: NEGATIVE
Protein, ur: NEGATIVE
Specific Gravity, Urine: 1.025 (ref 1.001–1.035)
pH: 5 (ref 5.0–8.0)

## 2022-04-09 MED ORDER — PREDNISONE 20 MG PO TABS
ORAL_TABLET | ORAL | 0 refills | Status: DC
Start: 2022-04-09 — End: 2023-08-04

## 2022-04-09 MED ORDER — TRIAMCINOLONE ACETONIDE 0.1 % EX CREA: 1.0000 "application " | TOPICAL_CREAM | Freq: Two times a day (BID) | CUTANEOUS | 2 refills | Status: DC

## 2022-04-09 NOTE — Progress Notes (Signed)
Subjective:    Patient ID: Nina Ramos, female    DOB: [birth year], 76 y.o.   MRN: [id number]  Hip Pain    Patient went to the emergency room for back pain radiating into her left posterior hip.  She also reports pain radiating down her left leg.  The pain began after she was lifting a heavy lawnmower mowed her yard.  She denies any bowel or bladder incontinence.  She denies any leg weakness.  She has normal strength 5/5 equal and symmetric in both legs.  She has normal reflexes checked at the patella as well as at the Achilles.  However she has been taking Aleve and Tylenol without any benefit and also using Lidoderm patches without any benefit. Past Medical History:  Diagnosis Date  . Chronic venous insufficiency   . Hyperlipidemia   . Hypertension   . Osteoporosis     History reviewed. No pertinent surgical history. Current Outpatient Medications on File Prior to Visit  Medication Sig Dispense Refill  . alendronate (FOSAMAX) 70 MG tablet Take with a full glass of water on an empty stomach. 12 tablet 0  . fluocinolone (VANOS) 0.01 % cream APPLY ON THE SKIN TWICE DAILY 30 g 3  . gabapentin (NEURONTIN) 100 MG capsule Take 1 capsule (100 mg total) by mouth at bedtime. 90 capsule 0  . hydrochlorothiazide (HYDRODIURIL) 25 MG tablet TAKE ONE-HALF TABLET BY  MOUTH DAILY 45 tablet 0  . lidocaine (LIDODERM) 5 % Place 1 patch onto the skin daily. Remove & Discard patch within 12 hours or as directed by MD 5 patch 0  . promethazine (PHENERGAN) 25 MG suppository Place 1 suppository (25 mg total) rectally every 6 (six) hours as needed for nausea or vomiting. 12 each 0  . simvastatin (ZOCOR) 20 MG tablet Take 1 tablet (20 mg total) by mouth at bedtime. (Patient not taking: Reported on 04/09/2022) 90 tablet 0   No current facility-administered medications on file prior to visit.   Social History   Socioeconomic History  . Marital status: Married    Spouse name: Not on file  . Number of  children: Not on file  . Years of education: Not on file  . Highest education level: Not on file  Occupational History  . Not on file  Tobacco Use  . Smoking status: Former  . Smokeless tobacco: Former    Quit date: 02/05/1994  Substance and Sexual Activity  . Alcohol use: Never  . Drug use: Never  . Sexual activity: Not on file  Other Topics Concern  . Not on file  Social History Narrative  . Not on file   Social Determinants of Health   Financial Resource Strain: Not on file  Food Insecurity: Not on file  Transportation Needs: Not on file  Physical Activity: Not on file  Stress: Not on file  Social Connections: Not on file  Intimate Partner Violence: Not on file   Family History  Problem Relation Age of Onset  . Asthma Mother   . Heart disease Father   . Breast cancer Paternal Aunt      Review of Systems  All other systems reviewed and are negative.     Objective:   Physical Exam Constitutional:      General: She is not in acute distress.    Appearance: Normal appearance. She is normal weight. She is not ill-appearing or toxic-appearing.  Cardiovascular:     Rate and Rhythm: Normal rate and regular rhythm.  Heart sounds: Normal heart sounds. No murmur heard.   No friction rub. No gallop.  Pulmonary:     Effort: Pulmonary effort is normal. No respiratory distress.     Breath sounds: Normal breath sounds. No stridor. No wheezing or rales.  Abdominal:     General: Bowel sounds are normal. There is no distension.     Palpations: Abdomen is soft. There is no mass.     Tenderness: There is no abdominal tenderness. There is no guarding.  Musculoskeletal:     Lumbar back: No deformity, spasms or tenderness. Normal range of motion. Negative right straight leg raise test and negative left straight leg raise test.       Back:     Right lower leg: No edema.     Left lower leg: No edema.  Neurological:     Mental Status: She is alert.    Patient has numerous  varicose veins all over her legs and mild trace edema in both ankles      Assessment & Plan:   Urine frequency - Plan: Urinalysis, Routine w reflex microscopic  Left sided sciatica Urinalysis shows no sign of urinary tract infection.  I suspect a herniated disc back.  Try a prednisone taper pack and then reassess in 1 week.  Pain is gradually improving, an MRI is not necessary.  If pain is worsening or persist greater than 6 weeks I would recommend an MRI.

## 2022-04-13 ENCOUNTER — Other Ambulatory Visit: Payer: Self-pay

## 2022-04-13 ENCOUNTER — Emergency Department (HOSPITAL_BASED_OUTPATIENT_CLINIC_OR_DEPARTMENT_OTHER)
Admission: EM | Admit: 2022-04-13 | Discharge: 2022-04-13 | Disposition: A | Discharge status: Home / Self Care | Payer: Medicare Other | Attending: Emergency Medicine | Admitting: Emergency Medicine

## 2022-04-13 ENCOUNTER — Encounter (HOSPITAL_BASED_OUTPATIENT_CLINIC_OR_DEPARTMENT_OTHER): Payer: Self-pay | Admitting: Emergency Medicine

## 2022-04-13 DIAGNOSIS — M545 Low back pain, unspecified: Secondary | ICD-10-CM | POA: Diagnosis not present

## 2022-04-13 DIAGNOSIS — I10 Essential (primary) hypertension: Secondary | ICD-10-CM | POA: Insufficient documentation

## 2022-04-13 DIAGNOSIS — Z79899 Other long term (current) drug therapy: Secondary | ICD-10-CM | POA: Insufficient documentation

## 2022-04-13 DIAGNOSIS — G609 Hereditary and idiopathic neuropathy, unspecified: Secondary | ICD-10-CM | POA: Insufficient documentation

## 2022-04-13 MED ORDER — CYCLOBENZAPRINE HCL 5 MG PO TABS: 5.0000 mg | ORAL_TABLET | Freq: Two times a day (BID) | ORAL | 0 refills | Status: DC | PRN

## 2022-04-13 NOTE — Discharge Instructions (Addendum)
If you develop worsening, recurrent, or continued back pain, numbness or weakness in the legs, incontinence of your bowels or bladders, numbness of your buttocks, fever, abdominal pain, or any other new/concerning symptoms then return to the ER for evaluation.  

## 2022-04-13 NOTE — ED Provider Notes (Signed)
[facility] EMERGENCY DEPT Provider Note   CSN: [id number] Arrival date & time: 04/13/22  1651     History  Chief Complaint  Patient presents with   Back Pain    Nina Ramos is a 76 y.o. female.  HPI 76 year old female presents with continued low back pain.  She has been dealing with this for multiple weeks.  She was seen just about 1 week ago in this emergency department.  She states that the pain is not really present at rest but anytime she gets up to walk she has significant low back pain that restricts activities.  Has been taking ibuprofen and Tylenol and the pain is manageable with this.  Saw her PCP earlier this week and was prescribed prednisone but only has 1 more dose left and it has not helped and so she presents to the ED for further treatment.  She denies any radicular pain.  Lifting her legs makes the pain worse.  She has chronic neuropathy but no new numbness and no weakness.  No incontinence or fevers.  No abdominal pain. Had a negative urinalysis at PCP's office.  Home Medications Prior to Admission medications   Medication Sig Start Date End Date Taking? Authorizing Provider  cyclobenzaprine (FLEXERIL) 5 MG tablet Take 1 tablet (5 mg total) by mouth 2 (two) times daily as needed for muscle spasms. 04/13/22  Yes Pricilla Loveless, MD  alendronate (FOSAMAX) 70 MG tablet Take with a full glass of water on an empty stomach. 08/22/21   Donita Brooks, MD  fluocinolone (VANOS) 0.01 % cream APPLY ON THE SKIN TWICE DAILY 03/09/19   Salley Scarlet, MD  gabapentin (NEURONTIN) 100 MG capsule Take 1 capsule (100 mg total) by mouth at bedtime. 01/23/22   Donita Brooks, MD  hydrochlorothiazide (HYDRODIURIL) 25 MG tablet TAKE ONE-HALF TABLET BY  MOUTH DAILY 04/04/21   Donita Brooks, MD  lidocaine (LIDODERM) 5 % Place 1 patch onto the skin daily. Remove & Discard patch within 12 hours or as directed by MD 04/07/22   Pricilla Loveless, MD  predniSONE (DELTASONE) 20  MG tablet 3 tabs poqday 1-2, 2 tabs poqday 3-4, 1 tab poqday 5-6 04/09/22   Donita Brooks, MD  promethazine (PHENERGAN) 25 MG suppository Place 1 suppository (25 mg total) rectally every 6 (six) hours as needed for nausea or vomiting. 02/11/18   Salley Scarlet, MD  simvastatin (ZOCOR) 20 MG tablet Take 1 tablet (20 mg total) by mouth at bedtime. Patient not taking: Reported on 04/09/2022 01/23/22   Donita Brooks, MD  triamcinolone cream (KENALOG) 0.1 % Apply 1 application. topically 2 (two) times daily. 04/09/22   Donita Brooks, MD      Allergies    Patient has no known allergies.    Review of Systems   Review of Systems  Constitutional:  Negative for fever.  Gastrointestinal:  Negative for abdominal pain.  Genitourinary:  Negative for dysuria.  Musculoskeletal:  Positive for back pain.  Neurological:  Negative for weakness and numbness.   Physical Exam Updated Vital Signs BP (!) 157/70   Pulse 69   Temp 98.2 F (36.8 C) (Oral)   Resp 16   SpO2 100%  Physical Exam Vitals and nursing note reviewed.  Constitutional:      General: She is not in acute distress.    Appearance: She is well-developed. She is not ill-appearing or diaphoretic.  HENT:     Head: Normocephalic and atraumatic.  Cardiovascular:  Rate and Rhythm: Normal rate and regular rhythm.     Pulses:          Dorsalis pedis pulses are 2+ on the right side and 2+ on the left side.  Pulmonary:     Effort: Pulmonary effort is normal.  Abdominal:     General: There is no distension.     Palpations: Abdomen is soft.     Tenderness: There is no abdominal tenderness.  Musculoskeletal:     Thoracic back: No tenderness.     Lumbar back: No tenderness.  Skin:    General: Skin is warm and dry.  Neurological:     Mental Status: She is alert.     Comments: 5/5 strength in BLE. Grossly normal sensation    ED Results / Procedures / Treatments   Labs (all labs ordered are listed, but only abnormal results  are displayed) Labs Reviewed - No data to display  EKG None  Radiology No results found.  Procedures Ultrasound ED Abd  Date/Time: 04/13/2022 6:22 PM Performed by: Pricilla Loveless, MD Authorized by: Pricilla Loveless, MD   Procedure details:    Indications: back pain     Assessment for:  AAA   Aorta:  Visualized        Vascular findings:    Aorta: aorta normal (< 3cm)     Intra-abdominal fluid: unidentified   Comments:     No AAA    Medications Ordered in ED Medications - No data to display  ED Course/ Medical Decision Making/ A&P                           Medical Decision Making Amount and/or Complexity of Data Reviewed Independent Historian:     Details: daughter External Data Reviewed: radiology and notes.   Given patient's age and history of hypertension I did a bedside ultrasound to rule out AAA.  This is negative.  My suspicion is she will need an MRI but I have low suspicion she has an acute spinal cord emergency.  No red flags on exam.  Advised to continue ibuprofen and Tylenol we discussed about other management and she would like to try a muscle relaxer.  Do not think is unreasonable that we did counsel on side effects.  Otherwise, no fever here and no focal neurodeficits.  Will discharge home with return precautions.        Final Clinical Impression(s) / ED Diagnoses Final diagnoses:  Acute bilateral low back pain without sciatica    Rx / DC Orders ED Discharge Orders          Ordered    cyclobenzaprine (FLEXERIL) 5 MG tablet  2 times daily PRN        04/13/22 Merri Brunette, MD 04/13/22 1825

## 2022-04-13 NOTE — ED Triage Notes (Signed)
Pt still having left leg/back /tailbone pain , has went to primary and is on her last day of prednisone with no relief. Has been alternating ibuprofen and tylenol.

## 2022-04-16 ENCOUNTER — Other Ambulatory Visit: Payer: Self-pay | Admitting: Family Medicine

## 2022-04-16 ENCOUNTER — Telehealth: Payer: Self-pay | Admitting: Family Medicine

## 2022-04-16 DIAGNOSIS — M5416 Radiculopathy, lumbar region: Secondary | ICD-10-CM

## 2022-04-16 NOTE — Telephone Encounter (Signed)
Darl Pikes, patients daughter called in. She states that the last time she was here to see Dr. Tanya Nones after her recent ER visit that he told her that if the pain in her hip didn't get better that he would recommend a MRI. Patient recently was seen in the ER again for hip pain. They are requesting we send patient for MRI of hip to see where the pain is coming from.  CB# [phone]

## 2022-04-16 NOTE — Telephone Encounter (Signed)
Do you want me to send the referral in for pt?

## 2022-04-17 ENCOUNTER — Ambulatory Visit
Admission: RE | Admit: 2022-04-17 | Discharge: 2022-04-17 | Disposition: A | Discharge status: Home / Self Care | Payer: Medicare Other | Source: Ambulatory Visit | Attending: Family Medicine | Admitting: Family Medicine

## 2022-04-17 DIAGNOSIS — M5416 Radiculopathy, lumbar region: Secondary | ICD-10-CM | POA: Diagnosis not present

## 2022-04-17 DIAGNOSIS — M48061 Spinal stenosis, lumbar region without neurogenic claudication: Secondary | ICD-10-CM | POA: Diagnosis not present

## 2022-04-17 NOTE — Telephone Encounter (Addendum)
Pt is aware MRI referral sent in

## 2022-04-18 ENCOUNTER — Telehealth: Payer: Self-pay

## 2022-04-18 NOTE — Telephone Encounter (Signed)
Pt spouse called in wanting to discuss the results of pt's scan results.   Cb#: [phone]

## 2022-04-19 ENCOUNTER — Other Ambulatory Visit: Payer: Self-pay | Admitting: Family Medicine

## 2022-04-19 DIAGNOSIS — M5416 Radiculopathy, lumbar region: Secondary | ICD-10-CM

## 2022-04-19 MED ORDER — HYDROCODONE-ACETAMINOPHEN 5-325 MG PO TABS: 1.0000 | ORAL_TABLET | Freq: Four times a day (QID) | ORAL | 0 refills | Status: DC | PRN

## 2022-04-19 NOTE — Telephone Encounter (Signed)
LM for pt or pt's husband to call call back.   Re:Pt spouse called in wanting to discuss the results of pt's scan results  Note: there is no documentation of any DPR, can't discuss med info with husband.

## 2022-05-09 ENCOUNTER — Emergency Department (HOSPITAL_BASED_OUTPATIENT_CLINIC_OR_DEPARTMENT_OTHER): Payer: Medicare Other

## 2022-05-09 ENCOUNTER — Emergency Department (HOSPITAL_BASED_OUTPATIENT_CLINIC_OR_DEPARTMENT_OTHER)
Admission: EM | Admit: 2022-05-09 | Discharge: 2022-05-09 | Disposition: A | Discharge status: Home / Self Care | Payer: Medicare Other | Attending: Emergency Medicine | Admitting: Emergency Medicine

## 2022-05-09 ENCOUNTER — Emergency Department (HOSPITAL_BASED_OUTPATIENT_CLINIC_OR_DEPARTMENT_OTHER)
Admission: EM | Admit: 2022-05-09 | Discharge: 2022-05-09 | Disposition: A | Discharge status: Home / Self Care | Payer: Medicare Other | Source: Home / Self Care | Attending: Emergency Medicine | Admitting: Emergency Medicine

## 2022-05-09 ENCOUNTER — Other Ambulatory Visit: Payer: Self-pay

## 2022-05-09 ENCOUNTER — Encounter (HOSPITAL_BASED_OUTPATIENT_CLINIC_OR_DEPARTMENT_OTHER): Payer: Self-pay | Admitting: Obstetrics and Gynecology

## 2022-05-09 ENCOUNTER — Encounter: Payer: Self-pay | Admitting: Family Medicine

## 2022-05-09 ENCOUNTER — Encounter (HOSPITAL_BASED_OUTPATIENT_CLINIC_OR_DEPARTMENT_OTHER): Payer: Self-pay | Admitting: Emergency Medicine

## 2022-05-09 ENCOUNTER — Ambulatory Visit: Payer: Medicare Other

## 2022-05-09 DIAGNOSIS — T887XXA Unspecified adverse effect of drug or medicament, initial encounter: Secondary | ICD-10-CM | POA: Insufficient documentation

## 2022-05-09 DIAGNOSIS — R1032 Left lower quadrant pain: Secondary | ICD-10-CM | POA: Insufficient documentation

## 2022-05-09 DIAGNOSIS — T40605A Adverse effect of unspecified narcotics, initial encounter: Secondary | ICD-10-CM | POA: Insufficient documentation

## 2022-05-09 DIAGNOSIS — D72819 Decreased white blood cell count, unspecified: Secondary | ICD-10-CM | POA: Diagnosis not present

## 2022-05-09 DIAGNOSIS — I700 Atherosclerosis of aorta: Secondary | ICD-10-CM | POA: Diagnosis not present

## 2022-05-09 DIAGNOSIS — T50905A Adverse effect of unspecified drugs, medicaments and biological substances, initial encounter: Secondary | ICD-10-CM | POA: Diagnosis not present

## 2022-05-09 DIAGNOSIS — M79605 Pain in left leg: Secondary | ICD-10-CM | POA: Diagnosis not present

## 2022-05-09 DIAGNOSIS — I10 Essential (primary) hypertension: Secondary | ICD-10-CM | POA: Insufficient documentation

## 2022-05-09 DIAGNOSIS — Z79899 Other long term (current) drug therapy: Secondary | ICD-10-CM | POA: Insufficient documentation

## 2022-05-09 DIAGNOSIS — M7989 Other specified soft tissue disorders: Secondary | ICD-10-CM | POA: Diagnosis not present

## 2022-05-09 DIAGNOSIS — N200 Calculus of kidney: Secondary | ICD-10-CM | POA: Diagnosis not present

## 2022-05-09 LAB — URINALYSIS, ROUTINE W REFLEX MICROSCOPIC
Bilirubin Urine: NEGATIVE
Glucose, UA: NEGATIVE mg/dL
Hgb urine dipstick: NEGATIVE
Ketones, ur: NEGATIVE mg/dL
Nitrite: NEGATIVE
Protein, ur: NEGATIVE mg/dL
Specific Gravity, Urine: 1.021 (ref 1.005–1.030)
pH: 5 (ref 5.0–8.0)

## 2022-05-09 LAB — BASIC METABOLIC PANEL
Anion gap: 8 (ref 5–15)
BUN: 23 mg/dL (ref 8–23)
CO2: 29 mmol/L (ref 22–32)
Calcium: 9.4 mg/dL (ref 8.9–10.3)
Chloride: 101 mmol/L (ref 98–111)
Creatinine, Ser: 0.96 mg/dL (ref 0.44–1.00)
GFR, Estimated: >60 mL/min (ref >60)
Glucose, Bld: 92 mg/dL (ref 70–99)
Potassium: 3.7 mmol/L (ref 3.5–5.1)
Sodium: 138 mmol/L (ref 135–145)

## 2022-05-09 LAB — CBC WITH DIFFERENTIAL/PLATELET
Abs Immature Granulocytes: 0.01 10*3/uL (ref 0.00–0.07)
Basophils Absolute: 0 10*3/uL (ref 0.0–0.1)
Basophils Relative: 0 %
Eosinophils Absolute: 0.1 10*3/uL (ref 0.0–0.5)
Eosinophils Relative: 2 %
HCT: 41.1 % (ref 36.0–46.0)
Hemoglobin: 13.3 g/dL (ref 12.0–15.0)
Immature Granulocytes: 0 %
Lymphocytes Relative: 30 %
Lymphs Abs: 0.7 10*3/uL (ref 0.7–4.0)
MCH: 31.1 pg (ref 26.0–34.0)
MCHC: 32.4 g/dL (ref 30.0–36.0)
MCV: 96.3 fL (ref 80.0–100.0)
Monocytes Absolute: 0.4 10*3/uL (ref 0.1–1.0)
Monocytes Relative: 16 %
Neutro Abs: 1.3 10*3/uL — ABNORMAL LOW (ref 1.7–7.7)
Neutrophils Relative %: 52 %
Platelets: 252 10*3/uL (ref 150–400)
RBC: 4.27 MIL/uL (ref 3.87–5.11)
RDW: 14.6 % (ref 11.5–15.5)
WBC: 2.5 10*3/uL — ABNORMAL LOW (ref 4.0–10.5)
nRBC: 0 % (ref 0.0–0.2)

## 2022-05-09 MED ORDER — TRAMADOL HCL 50 MG PO TABS: 50.0000 mg | ORAL_TABLET | Freq: Four times a day (QID) | ORAL | 0 refills | Status: DC | PRN

## 2022-05-09 MED ORDER — TRAMADOL HCL 50 MG PO TABS
50.0000 mg | ORAL_TABLET | Freq: Four times a day (QID) | ORAL | 0 refills | Status: DC | PRN
  Filled 2022-05-09: qty 15, 4d supply, fill #0

## 2022-05-09 MED ORDER — ONDANSETRON 4 MG PO TBDP
4.0000 mg | ORAL_TABLET | Freq: Once | ORAL | Status: AC
Start: 2022-05-09 — End: 2022-05-09
  Administered 2022-05-09: 4 mg via ORAL
  Filled 2022-05-09: qty 1

## 2022-05-09 MED ORDER — TRAMADOL HCL 50 MG PO TABS
50.0000 mg | ORAL_TABLET | Freq: Once | ORAL | Status: AC
  Administered 2022-05-09: 50 mg via ORAL
  Filled 2022-05-09: qty 1

## 2022-05-09 MED ORDER — ONDANSETRON 4 MG PO TBDP: 4.0000 mg | ORAL_TABLET | Freq: Three times a day (TID) | ORAL | 0 refills | Status: DC | PRN

## 2022-05-09 MED ORDER — HYDROCODONE-ACETAMINOPHEN 5-325 MG PO TABS
1.0000 | ORAL_TABLET | Freq: Once | ORAL | Status: DC
  Filled 2022-05-09: qty 1

## 2022-05-09 NOTE — ED Notes (Signed)
Pt and daughter, at bedside, agreeable with d/c plan as discussed by provider- this nurse has verbally reinforced d/c instructions as discussed by provider- this nurse has verbally reinforced d/c instructions and provided pt with written copy- pt acknowledges verbal understanding and denies any additional questions, concerns, needs- pt escorted out via w/c; ambulatory at baseline with personal tripod cane

## 2022-05-09 NOTE — ED Provider Notes (Addendum)
[facility] EMERGENCY DEPT  Provider Note  CSN: [id number] Arrival date & time: 05/09/22 2224  History Chief Complaint  Patient presents with   Allergic Reaction    Nina Ramos is a 76 y.o. female seen earlier in the day for groin pain, felt to be due to her sciatica after workup was negative for other causes. Patient/daughter had apparently requested tramadol for her pain as Motrin/APAP were not effective. She reports shortly after taking her first dose, she began to feel dizzy and nauseated with some vomiting. She reports she had a similar reaction after taking hydrocodone prescribed to her earlier this month by PCP. She is feeling better at the time of my evaluation.    Home Medications Prior to Admission medications   Medication Sig Start Date End Date Taking? Authorizing Provider  ondansetron (ZOFRAN-ODT) 4 MG disintegrating tablet Take 1 tablet (4 mg total) by mouth every 8 (eight) hours as needed for nausea or vomiting. 05/09/22  Yes Pollyann Savoy, MD  alendronate (FOSAMAX) 70 MG tablet Take with a full glass of water on an empty stomach. 08/22/21   Donita Brooks, MD  cyclobenzaprine (FLEXERIL) 5 MG tablet Take 1 tablet (5 mg total) by mouth 2 (two) times daily as needed for muscle spasms. 04/13/22   Pricilla Loveless, MD  fluocinolone (VANOS) 0.01 % cream APPLY ON THE SKIN TWICE DAILY 03/09/19   Salley Scarlet, MD  gabapentin (NEURONTIN) 100 MG capsule Take 1 capsule (100 mg total) by mouth at bedtime. 01/23/22   Donita Brooks, MD  hydrochlorothiazide (HYDRODIURIL) 25 MG tablet TAKE ONE-HALF TABLET BY  MOUTH DAILY 04/04/21   Donita Brooks, MD  lidocaine (LIDODERM) 5 % Place 1 patch onto the skin daily. Remove & Discard patch within 12 hours or as directed by MD 04/07/22   Pricilla Loveless, MD  predniSONE (DELTASONE) 20 MG tablet 3 tabs poqday 1-2, 2 tabs poqday 3-4, 1 tab poqday 5-6 04/09/22   Donita Brooks, MD  promethazine (PHENERGAN) 25 MG suppository  Place 1 suppository (25 mg total) rectally every 6 (six) hours as needed for nausea or vomiting. 02/11/18   Salley Scarlet, MD  simvastatin (ZOCOR) 20 MG tablet Take 1 tablet (20 mg total) by mouth at bedtime. Patient not taking: Reported on 04/09/2022 01/23/22   Donita Brooks, MD  triamcinolone cream (KENALOG) 0.1 % Apply 1 application. topically 2 (two) times daily. 04/09/22   Donita Brooks, MD     Allergies    Patient has no known allergies.   Review of Systems   Review of Systems Please see HPI for pertinent positives and negatives  Physical Exam BP (!) 144/73 (BP Location: Left Arm)   Pulse 72   Temp (!) 96.7 F (35.9 C)   Resp (!) 25   Ht 5\' 4"  (1.626 m)   Wt 63.5 kg   SpO2 100%   BMI 24.03 kg/m   Physical Exam Vitals and nursing note reviewed.  Constitutional:      Appearance: Normal appearance.  HENT:     Head: Normocephalic and atraumatic.     Nose: Nose normal.     Mouth/Throat:     Mouth: Mucous membranes are moist.  Eyes:     Extraocular Movements: Extraocular movements intact.     Conjunctiva/sclera: Conjunctivae normal.  Cardiovascular:     Rate and Rhythm: Normal rate.  Pulmonary:     Effort: Pulmonary effort is normal.     Breath sounds: Normal  breath sounds.  Abdominal:     General: Abdomen is flat.     Palpations: Abdomen is soft.     Tenderness: There is no abdominal tenderness.  Musculoskeletal:        General: No swelling. Normal range of motion.     Cervical back: Neck supple.  Skin:    General: Skin is warm and dry.  Neurological:     General: No focal deficit present.     Mental Status: She is alert.  Psychiatric:        Mood and Affect: Mood normal.     ED Results / Procedures / Treatments   EKG EKG Interpretation  Date/Time:  Thursday May 09 2022 22:47:56 EDT Ventricular Rate:  68 PR Interval:  162 QRS Duration: 80 QT Interval:  426 QTC Calculation: 452 R Axis:   88 Text Interpretation: Normal sinus rhythm Normal  ECG No previous ECGs available Confirmed by Susy Frizzle [phone]) on 05/09/2022 11:41:15 PM  Procedures Procedures  Medications Ordered in the ED Medications  ondansetron (ZOFRAN-ODT) disintegrating tablet 4 mg (4 mg Oral Given 05/09/22 2245)    Initial Impression and Plan  Patient here with adverse reaction to Tramadol. She has normal vitals and reassuring exam. Given Zofran in the ED with improvement. Advised it may be several hours for symptoms to fully resolve and also advised she avoid opiates in the future. PCP follow up.   ED Course       MDM Rules/Calculators/A&P Medical Decision Making Problems Addressed: Adverse effect of drug, initial encounter: acute illness or injury  Risk Prescription drug management.    Final Clinical Impression(s) / ED Diagnoses Final diagnoses:  Adverse effect of drug, initial encounter    Rx / DC Orders ED Discharge Orders          Ordered    ondansetron (ZOFRAN-ODT) 4 MG disintegrating tablet  Every 8 hours PRN        05/09/22 2348               Pollyann Savoy, MD 05/09/22 2349

## 2022-05-09 NOTE — ED Notes (Signed)
RT assessed pt in waiting area for possible allergic reaction w/SOB. Pt respiratory status stable, w/BILAT BS clear/diminished, airway patent, no throat, tongue or uvula swelling noted, pt in no respiratory distress at this time. RT will continue to monitor.

## 2022-05-09 NOTE — ED Notes (Signed)
Pt ambulated around room with cane that she uses at baseline without difficulty or need of assistance. Pt reports pain to be 6/10 while ambulating.

## 2022-05-09 NOTE — ED Triage Notes (Signed)
Patient took the prescribed Tramadol and believes she is having an allergic reaction. Patient c/o nausea and dizziness that started approx. 1 1/2 hours after taking medication. States her body feels numb and was feeling hot and cold.

## 2022-05-09 NOTE — ED Triage Notes (Signed)
Patient reports to the ER for left sided leg /groin pain. Patient reports she has had some pain previously due to issues with her discs in her spine but lately she has started to get worse. Patient also reports foul smelling urine.

## 2022-05-09 NOTE — Discharge Instructions (Signed)
Your testing today shows no new fractures.  You have fractures of your sacrum and L1 vertebrae which are stable.  As we discussed today your pain may be musculoskeletal from a muscle tear or related to these fractures.  Your testing is negative for any blood clot. Take the Ultram as prescribed.  Do not take Motrin with this medication.  Follow-up with Dr. Tanya Nones for an MRI of your hip if you continue to have pain and difficulty walking. Return to the ED with new or worsening symptoms

## 2022-05-09 NOTE — ED Notes (Signed)
Pt arrives at this time from waiting room via w/c escorted by daughter and staff nurse, Alvino Chapel, RN

## 2022-05-09 NOTE — ED Notes (Signed)
Pt vomited undigested food (about 300cc) in lobby. Brought pt back and she feels much better now

## 2022-05-10 ENCOUNTER — Other Ambulatory Visit: Payer: Self-pay | Admitting: Family Medicine

## 2022-05-10 ENCOUNTER — Other Ambulatory Visit (HOSPITAL_BASED_OUTPATIENT_CLINIC_OR_DEPARTMENT_OTHER): Payer: Self-pay

## 2022-05-10 DIAGNOSIS — M8448XD Pathological fracture, other site, subsequent encounter for fracture with routine healing: Secondary | ICD-10-CM

## 2022-05-10 DIAGNOSIS — M479 Spondylosis, unspecified: Secondary | ICD-10-CM

## 2022-05-15 NOTE — Patient Instructions (Signed)
[name] , Thank you for taking time to come for your Medicare Wellness Visit. I appreciate your ongoing commitment to your health goals. Please review the following plan we discussed and let me know if I can assist you in the future.   Screening recommendations/referrals: Colonoscopy: No longer required. Mammogram: Done 02/08/2022 Repeat annually  Bone Density: Done 03/31/2017 Repeat every 2 years  Recommended yearly ophthalmology/optometry visit for glaucoma screening and checkup Recommended yearly dental visit for hygiene and checkup  Vaccinations: Influenza vaccine: Due Fall 2023. Pneumococcal vaccine: Done 10/19/2018 and 09/07/2013 Tdap vaccine: Due every 10 years. Shingles vaccine: Available at your local pharmacy.   Covid-19:Available at your local pharmacy.  Advanced directives: Please bring a copy of your health care power of attorney and living will to the office to be added to your chart at your convenience.   Conditions/risks identified: Aim for 30 minutes of exercise or brisk walking, 6-8 glasses of water, and 5 servings of fruits and vegetables each day.   Next appointment: Follow up in one year for your annual wellness visit 2024.   Preventive Care 77 Years and Older, Female Preventive care refers to lifestyle choices and visits with your health care provider that can promote health and wellness. What does preventive care include? A yearly physical exam. This is also called an annual well check. Dental exams once or twice a year. Routine eye exams. Ask your health care provider how often you should have your eyes checked. Personal lifestyle choices, including: Daily care of your teeth and gums. Regular physical activity. Eating a healthy diet. Avoiding tobacco and drug use. Limiting alcohol use. Practicing safe sex. Taking low-dose aspirin every day. Taking vitamin and mineral supplements as recommended by your health care provider. What happens during an  annual well check? The services and screenings done by your health care provider during your annual well check will depend on your age, overall health, lifestyle risk factors, and family history of disease. Counseling  Your health care provider may ask you questions about your: Alcohol use. Tobacco use. Drug use. Emotional well-being. Home and relationship well-being. Sexual activity. Eating habits. History of falls. Memory and ability to understand (cognition). Work and work Astronomer. Reproductive health. Screening  You may have the following tests or measurements: Height, weight, and BMI. Blood pressure. Lipid and cholesterol levels. These may be checked every 5 years, or more frequently if you are over 40 years old. Skin check. Lung cancer screening. You may have this screening every year starting at age 42 if you have a 30-pack-year history of smoking and currently smoke or have quit within the past 15 years. Fecal occult blood test (FOBT) of the stool. You may have this test every year starting at age 45. Flexible sigmoidoscopy or colonoscopy. You may have a sigmoidoscopy every 5 years or a colonoscopy every 10 years starting at age 26. Hepatitis C blood test. Hepatitis B blood test. Sexually transmitted disease (STD) testing. Diabetes screening. This is done by checking your blood sugar (glucose) after you have not eaten for a while (fasting). You may have this done every 1-3 years. Bone density scan. This is done to screen for osteoporosis. You may have this done starting at age 5. Mammogram. This may be done every 1-2 years. Talk to your health care provider about how often you should have regular mammograms. Talk with your health care provider about your test results, treatment options, and if necessary, the need for more tests. Vaccines  Your health  care provider may recommend certain vaccines, such as: Influenza vaccine. This is recommended every year. Tetanus,  diphtheria, and acellular pertussis (Tdap, Td) vaccine. You may need a Td booster every 10 years. Zoster vaccine. You may need this after age 80. Pneumococcal 13-valent conjugate (PCV13) vaccine. One dose is recommended after age 75. Pneumococcal polysaccharide (PPSV23) vaccine. One dose is recommended after age 66. Talk to your health care provider about which screenings and vaccines you need and how often you need them. This information is not intended to replace advice given to you by your health care provider. Make sure you discuss any questions you have with your health care provider. Document Released: 12/01/2015 Document Revised: 07/24/2016 Document Reviewed: 09/05/2015 Elsevier Interactive Patient Education  2017 [employer] Prevention in the Home Falls can cause injuries. They can happen to people of all ages. There are many things you can do to make your home safe and to help prevent falls. What can I do on the outside of my home? Regularly fix the edges of walkways and driveways and fix any cracks. Remove anything that might make you trip as you walk through a door, such as a raised step or threshold. Trim any bushes or trees on the path to your home. Use bright outdoor lighting. Clear any walking paths of anything that might make someone trip, such as rocks or tools. Regularly check to see if handrails are loose or broken. Make sure that both sides of any steps have handrails. Any raised decks and porches should have guardrails on the edges. Have any leaves, snow, or ice cleared regularly. Use sand or salt on walking paths during winter. Clean up any spills in your garage right away. This includes oil or grease spills. What can I do in the bathroom? Use night lights. Install grab bars by the toilet and in the tub and shower. Do not use towel bars as grab bars. Use non-skid mats or decals in the tub or shower. If you need to sit down in the shower, use a plastic,  non-slip stool. Keep the floor dry. Clean up any water that spills on the floor as soon as it happens. Remove soap buildup in the tub or shower regularly. Attach bath mats securely with double-sided non-slip rug tape. Do not have throw rugs and other things on the floor that can make you trip. What can I do in the bedroom? Use night lights. Make sure that you have a light by your bed that is easy to reach. Do not use any sheets or blankets that are too big for your bed. They should not hang down onto the floor. Have a firm chair that has side arms. You can use this for support while you get dressed. Do not have throw rugs and other things on the floor that can make you trip. What can I do in the kitchen? Clean up any spills right away. Avoid walking on wet floors. Keep items that you use a lot in easy-to-reach places. If you need to reach something above you, use a strong step stool that has a grab bar. Keep electrical cords out of the way. Do not use floor polish or wax that makes floors slippery. If you must use wax, use non-skid floor wax. Do not have throw rugs and other things on the floor that can make you trip. What can I do with my stairs? Do not leave any items on the stairs. Make sure that there are handrails on  both sides of the stairs and use them. Fix handrails that are broken or loose. Make sure that handrails are as long as the stairways. Check any carpeting to make sure that it is firmly attached to the stairs. Fix any carpet that is loose or worn. Avoid having throw rugs at the top or bottom of the stairs. If you do have throw rugs, attach them to the floor with carpet tape. Make sure that you have a light switch at the top of the stairs and the bottom of the stairs. If you do not have them, ask someone to add them for you. What else can I do to help prevent falls? Wear shoes that: Do not have high heels. Have rubber bottoms. Are comfortable and fit you well. Are closed  at the toe. Do not wear sandals. If you use a stepladder: Make sure that it is fully opened. Do not climb a closed stepladder. Make sure that both sides of the stepladder are locked into place. Ask someone to hold it for you, if possible. Clearly mark and make sure that you can see: Any grab bars or handrails. First and last steps. Where the edge of each step is. Use tools that help you move around (mobility aids) if they are needed. These include: Canes. Walkers. Scooters. Crutches. Turn on the lights when you go into a dark area. Replace any light bulbs as soon as they burn out. Set up your furniture so you have a clear path. Avoid moving your furniture around. If any of your floors are uneven, fix them. If there are any pets around you, be aware of where they are. Review your medicines with your doctor. Some medicines can make you feel dizzy. This can increase your chance of falling. Ask your doctor what other things that you can do to help prevent falls. This information is not intended to replace advice given to you by your health care provider. Make sure you discuss any questions you have with your health care provider. Document Released: 08/31/2009 Document Revised: 04/11/2016 Document Reviewed: 12/09/2014 Elsevier Interactive Patient Education  2017 [employer].

## 2022-05-16 ENCOUNTER — Ambulatory Visit (INDEPENDENT_AMBULATORY_CARE_PROVIDER_SITE_OTHER): Payer: Medicare Other

## 2022-05-16 VITALS — Ht 64.0 in | Wt 138.0 lb

## 2022-05-16 DIAGNOSIS — Z0000 Encounter for general adult medical examination without abnormal findings: Secondary | ICD-10-CM | POA: Diagnosis not present

## 2022-05-16 NOTE — Progress Notes (Signed)
Subjective:   Nina Ramos is a 76 y.o. female who presents for an Initial Medicare Annual Wellness Visit. Virtual Visit via Telephone Note  I connected with  [name] on 05/16/22 at  3:00 PM EDT by telephone and verified that I am speaking with the correct person using two identifiers.  Location: Patient: HOME Provider: [facility] Persons participating in the virtual visit: patient/Nurse Health Advisor   I discussed the limitations, risks, security and privacy concerns of performing an evaluation and management service by telephone and the availability of in person appointments. The patient expressed understanding and agreed to proceed.  Interactive audio and video telecommunications were attempted between this nurse and patient, however failed, due to patient having technical difficulties OR patient did not have access to video capability.  We continued and completed visit with audio only.  Some vital signs may be absent or patient reported.   Darral Dash, LPN   Review of Systems     Cardiac Risk Factors include: advanced age (>29men, >45 women);hypertension;dyslipidemia;sedentary lifestyle     Objective:    Today's Vitals   05/16/22 1506 05/16/22 1507  Weight: 138 lb (62.6 kg)   Height: 5\' 4"  (1.626 m)   PainSc:  3    Body mass index is 23.69 kg/m.     05/16/2022    3:16 PM 05/09/2022   10:41 PM 05/09/2022    2:59 PM 04/13/2022    5:05 PM 04/07/2022    3:11 PM  Advanced Directives  Does Patient Have a Medical Advance Directive? No No No No No  Would patient like information on creating a medical advance directive? No - Patient declined No - Patient declined No - Patient declined No - Patient declined No - Patient declined    Current Medications (verified) Outpatient Encounter Medications as of 05/16/2022  Medication Sig   alendronate (FOSAMAX) 70 MG tablet Take with a full glass of water on an empty stomach.   cyclobenzaprine (FLEXERIL) 5 MG tablet Take 1  tablet (5 mg total) by mouth 2 (two) times daily as needed for muscle spasms.   fluocinolone (VANOS) 0.01 % cream APPLY ON THE SKIN TWICE DAILY   gabapentin (NEURONTIN) 100 MG capsule Take 1 capsule (100 mg total) by mouth at bedtime.   hydrochlorothiazide (HYDRODIURIL) 25 MG tablet TAKE ONE-HALF TABLET BY  MOUTH DAILY   lidocaine (LIDODERM) 5 % Place 1 patch onto the skin daily. Remove & Discard patch within 12 hours or as directed by MD   ondansetron (ZOFRAN-ODT) 4 MG disintegrating tablet Take 1 tablet (4 mg total) by mouth every 8 (eight) hours as needed for nausea or vomiting.   predniSONE (DELTASONE) 20 MG tablet 3 tabs poqday 1-2, 2 tabs poqday 3-4, 1 tab poqday 5-6   simvastatin (ZOCOR) 20 MG tablet Take 1 tablet (20 mg total) by mouth at bedtime.   traMADol (ULTRAM) 50 MG tablet Take by mouth.   triamcinolone cream (KENALOG) 0.1 % Apply 1 application. topically 2 (two) times daily.   promethazine (PHENERGAN) 25 MG suppository Place 1 suppository (25 mg total) rectally every 6 (six) hours as needed for nausea or vomiting. (Patient not taking: Reported on 05/16/2022)   No facility-administered encounter medications on file as of 05/16/2022.    Allergies (verified) Patient has no known allergies.   History: Past Medical History:  Diagnosis Date   Chronic venous insufficiency    Hyperlipidemia    Hypertension    Osteoporosis    History reviewed.  No pertinent surgical history. Family History  Problem Relation Age of Onset   Asthma Mother    Heart disease Father    Breast cancer Paternal Aunt    Social History   Socioeconomic History   Marital status: Married    Spouse name: Not on file   Number of children: Not on file   Years of education: Not on file   Highest education level: Not on file  Occupational History   Not on file  Tobacco Use   Smoking status: Former    Passive exposure: Never   Smokeless tobacco: Former    Quit date: 02/05/1994  Vaping Use   Vaping Use:  Never used  Substance and Sexual Activity   Alcohol use: Never   Drug use: Never   Sexual activity: Not on file  Other Topics Concern   Not on file  Social History Narrative   Not on file   Social Determinants of Health   Financial Resource Strain: Low Risk  (05/16/2022)   Overall Financial Resource Strain (CARDIA)    Difficulty of Paying Living Expenses: Not hard at all  Food Insecurity: No Food Insecurity (05/16/2022)   Hunger Vital Sign    Worried About Running Out of Food in the Last Year: Never true    Ran Out of Food in the Last Year: Never true  Transportation Needs: No Transportation Needs (05/16/2022)   PRAPARE - Administrator, Civil Service (Medical): No    Lack of Transportation (Non-Medical): No  Physical Activity: Sufficiently Active (05/16/2022)   Exercise Vital Sign    Days of Exercise per Week: 5 days    Minutes of Exercise per Session: 30 min  Stress: No Stress Concern Present (05/16/2022)   [employer] of Occupational Health - Occupational Stress Questionnaire    Feeling of Stress : Not at all  Social Connections: Socially Integrated (05/16/2022)   Social Connection and Isolation Panel [NHANES]    Frequency of Communication with Friends and Family: More than three times a week    Frequency of Social Gatherings with Friends and Family: More than three times a week    Attends Religious Services: More than 4 times per year    Active Member of [employer] or Organizations: Yes    Attends Engineer, structural: More than 4 times per year    Marital Status: Married    Tobacco Counseling Counseling given: Not Answered   Clinical Intake:  Pre-visit preparation completed: Yes  Pain : 0-10 Pain Score: 3  Pain Type: Chronic pain Pain Location: Foot Pain Descriptors / Indicators: Aching, Discomfort, Burning Pain Onset: More than a month ago Pain Frequency: Intermittent     BMI - recorded: 23.69 Nutritional Status: BMI of 19-24   Normal Nutritional Risks: None Diabetes: No  How often do you need to have someone help you when you read instructions, pamphlets, or other written materials from your doctor or pharmacy?: 1 - Never  Diabetic?NO  Interpreter Needed?: No  Information entered by :: mj Sheffield Hawker, lpn   Activities of Daily Living    05/16/2022    3:19 PM  In your present state of health, do you have any difficulty performing the following activities:  Hearing? 0  Vision? 0  Difficulty concentrating or making decisions? 0  Walking or climbing stairs? 1  Comment DUE TO NEUROPATHY.  Dressing or bathing? 0  Doing errands, shopping? 0  Preparing Food and eating ? N  Using the Toilet? N  In  the past six months, have you accidently leaked urine? N  Do you have problems with loss of bowel control? N  Managing your Medications? N  Managing your Finances? N  Housekeeping or managing your Housekeeping? N    Patient Care Team: Donita Brooks, MD as PCP - General (Family Medicine)  Indicate any recent Medical Services you may have received from other than [facility] providers in the past year (date may be approximate).     Assessment:   This is a routine wellness examination for [place].  Hearing/Vision screen Hearing Screening - Comments:: No hearing issues.  Vision Screening - Comments:: Glasses. [employer]. 05/2022.  Dietary issues and exercise activities discussed: Current Exercise Habits: Home exercise routine, Type of exercise: walking, Time (Minutes): 30, Frequency (Times/Week): 5, Weekly Exercise (Minutes/Week): 150, Intensity: Mild, Exercise limited by: cardiac condition(s);neurologic condition(s)   Goals Addressed             This Visit's Progress    Exercise 3x per week (30 min per time)       Continue to stay active and healthy.        Depression Screen    05/16/2022    3:12 PM 10/05/2018    3:39 PM 04/18/2016    2:07 PM 07/07/2014    3:19 PM  PHQ 2/9 Scores  PHQ - 2  Score 0 0 0 0  PHQ- 9 Score   0     Fall Risk    05/16/2022    3:18 PM 10/05/2018    3:39 PM 04/18/2016    2:07 PM 07/07/2014    3:19 PM  Fall Risk   Falls in the past year? 0 0 Yes No  Number falls in past yr: 0  1   Injury with Fall? 0  Yes   Comment   bruising   Risk for fall due to : No Fall Risks     Follow up Falls prevention discussed Falls evaluation completed      FALL RISK PREVENTION PERTAINING TO THE HOME:  Any stairs in or around the home? Yes  If so, are there any without handrails? No  Home free of loose throw rugs in walkways, pet beds, electrical cords, etc? Yes  Adequate lighting in your home to reduce risk of falls? Yes   ASSISTIVE DEVICES UTILIZED TO PREVENT FALLS:  Life alert? No  Use of a cane, walker or w/c? Yes  Grab bars in the bathroom? Yes  Shower chair or bench in shower? Yes  Elevated toilet seat or a handicapped toilet? Yes   TIMED UP AND GO:  Was the test performed? No .  PHONE VISIT.    Cognitive Function:        05/16/2022    3:20 PM  6CIT Screen  What Year? 0 points  What month? 0 points  What time? 0 points  Count back from 20 0 points  Months in reverse 4 points  Repeat phrase 0 points  Total Score 4 points    Immunizations Immunization History  Administered Date(s) Administered   Fluad Quad(high Dose 65+) 07/22/2019   Influenza, High Dose Seasonal PF 10/19/2018   Influenza, Seasonal, Injecte, Preservative Fre 11/06/2012   Influenza,inj,Quad PF,6+ Mos 10/14/2016, 01/06/2018   Influenza-Unspecified 09/18/2013   Pneumococcal Conjugate-13 10/19/2018   Pneumococcal Polysaccharide-23 09/07/2013    TDAP status: Due, Education has been provided regarding the importance of this vaccine. Advised may receive this vaccine at local pharmacy or Health Dept. Aware to provide a  copy of the vaccination record if obtained from local pharmacy or Health Dept. Verbalized acceptance and understanding.  Flu Vaccine status: Due, Education  has been provided regarding the importance of this vaccine. Advised may receive this vaccine at local pharmacy or Health Dept. Aware to provide a copy of the vaccination record if obtained from local pharmacy or Health Dept. Verbalized acceptance and understanding.  Pneumococcal vaccine status: Up to date  Covid-19 vaccine status: Completed vaccines  Qualifies for Shingles Vaccine? Yes   Zostavax completed No   Shingrix Completed?: No.    Education has been provided regarding the importance of this vaccine. Patient has been advised to call insurance company to determine out of pocket expense if they have not yet received this vaccine. Advised may also receive vaccine at local pharmacy or Health Dept. Verbalized acceptance and understanding.  Screening Tests Health Maintenance  Topic Date Due   COVID-19 Vaccine (1) 06/01/2022 (Originally 02/05/1947)   TETANUS/TDAP  11/15/2022 (Originally 08/08/1965)   Zoster Vaccines- Shingrix (1 of 2) 11/15/2022 (Originally 08/09/1995)   INFLUENZA VACCINE  06/18/2022   Pneumonia Vaccine 28+ Years old  Completed   DEXA SCAN  Completed   Hepatitis C Screening  Completed   HPV VACCINES  Aged Out    Health Maintenance  There are no preventive care reminders to display for this patient.   Colorectal cancer screening: No longer required.   Mammogram status: Completed 02/08/2022. Repeat every year  Bone Density status: Completed 03/31/2017. Results reflect: Bone density results: OSTEOPOROSIS. Repeat every 2 years.  Lung Cancer Screening: (Low Dose CT Chest recommended if Age 58-80 years, 30 pack-year currently smoking OR have quit w/in 15years.) does not qualify.   Additional Screening:  Hepatitis C Screening: does qualify; Completed 10/19/2014  Vision Screening: Recommended annual ophthalmology exams for early detection of glaucoma and other disorders of the eye. Is the patient up to date with their annual eye exam?  Yes  Who is the provider or what is  the name of the office in which the patient attends annual eye exams? [facility]  If pt is not established with a provider, would they like to be referred to a provider to establish care? No .   Dental Screening: Recommended annual dental exams for proper oral hygiene  Community Resource Referral / Chronic Care Management: CRR required this visit?  No   CCM required this visit?  No      Plan:     I have personally reviewed and noted the following in the patient's chart:   Medical and social history Use of alcohol, tobacco or illicit drugs  Current medications and supplements including opioid prescriptions. Patient is not currently taking opioid prescriptions. Functional ability and status Nutritional status Physical activity Advanced directives List of other physicians Hospitalizations, surgeries, and ER visits in previous 12 months Vitals Screenings to include cognitive, depression, and falls Referrals and appointments  In addition, I have reviewed and discussed with patient certain preventive protocols, quality metrics, and best practice recommendations. A written personalized care plan for preventive services as well as general preventive health recommendations were provided to patient.     Darral Dash, LPN   5/32/9924   Nurse Notes: Discussed Shingrix and how to obtain.

## 2022-06-05 ENCOUNTER — Ambulatory Visit: Payer: Medicare Other | Admitting: Orthopaedic Surgery

## 2022-09-13 IMAGING — MG MM DIGITAL SCREENING BILAT W/ TOMO AND CAD
6 of 10 series · 6 of 30 positions shown · non-contrast
Comparison: Previous exam(s).

CLINICAL DATA: Screening.

EXAM:
DIGITAL SCREENING BILATERAL MAMMOGRAM WITH TOMOSYNTHESIS AND CAD
TECHNIQUE: Bilateral screening digital craniocaudal and mediolateral oblique
mammograms were obtained. Bilateral screening digital breast
tomosynthesis was performed. The images were evaluated with
computer-aided detection.

[R CC synth-2D]
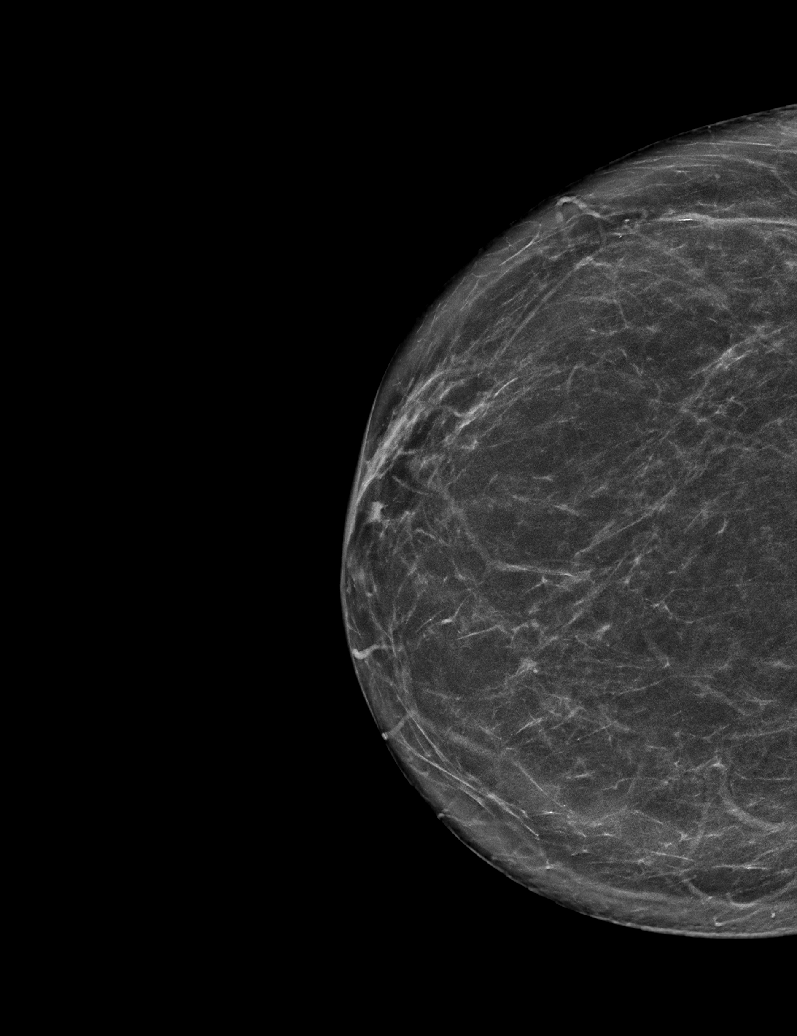

[L MLO synth-2D (1 of 2)]
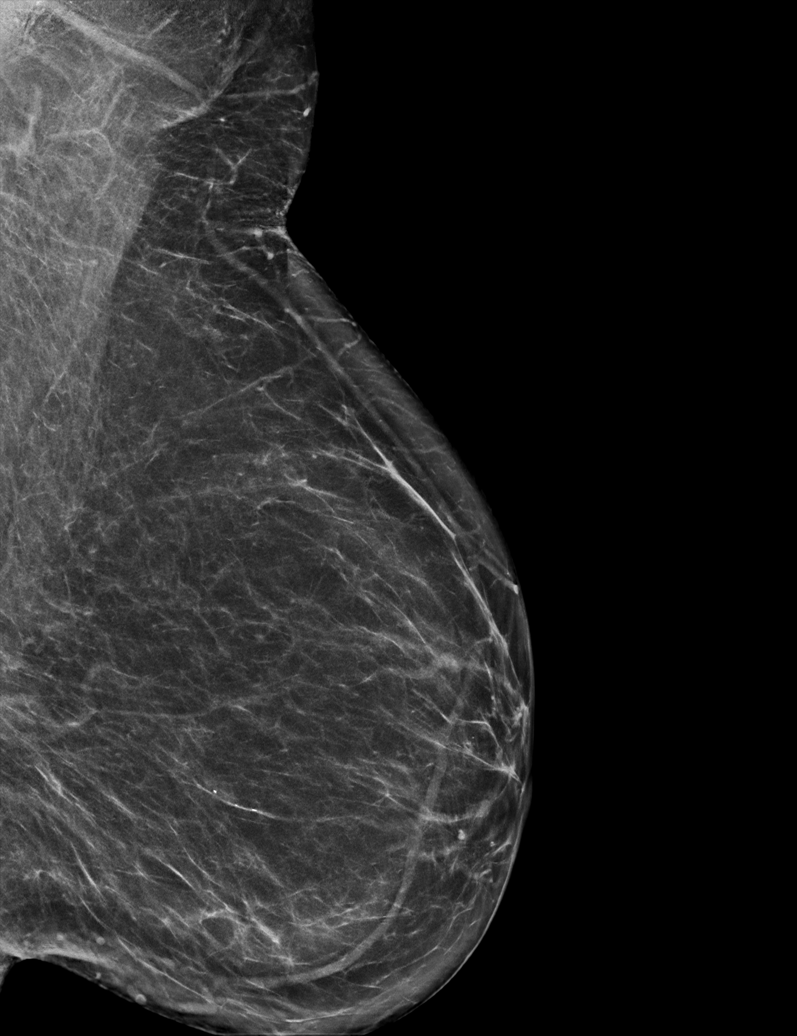

[R MLO synth-2D]
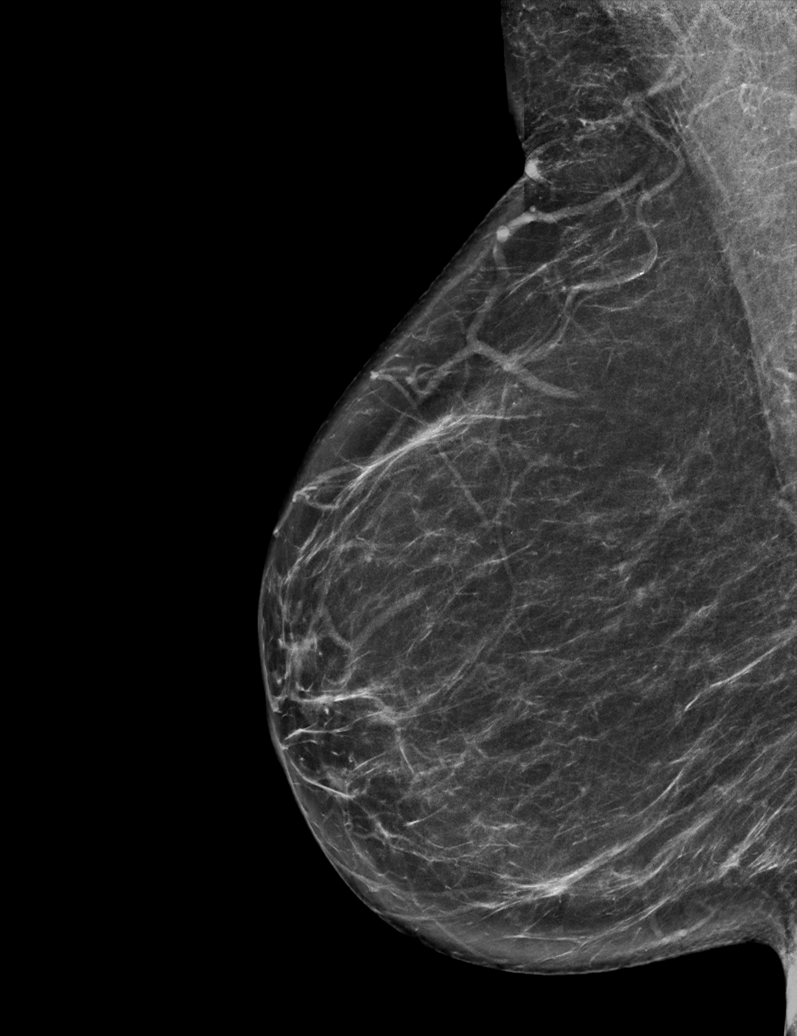

[L CC synth-2D]
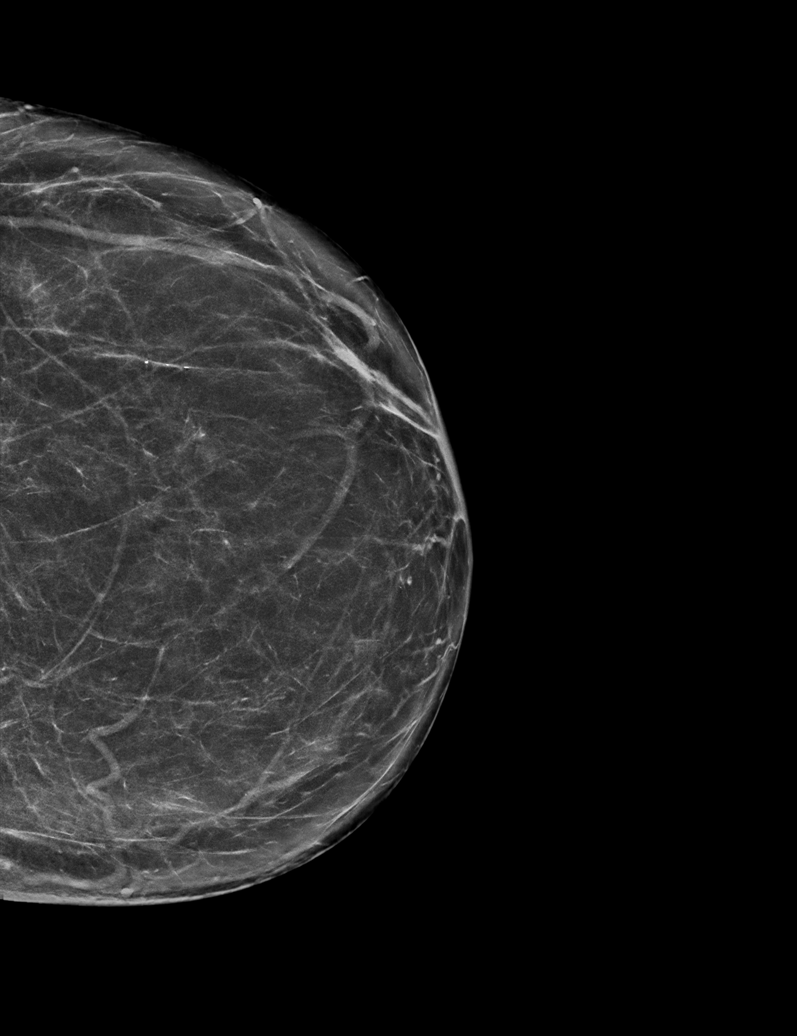

[L MLO synth-2D (2 of 2)]
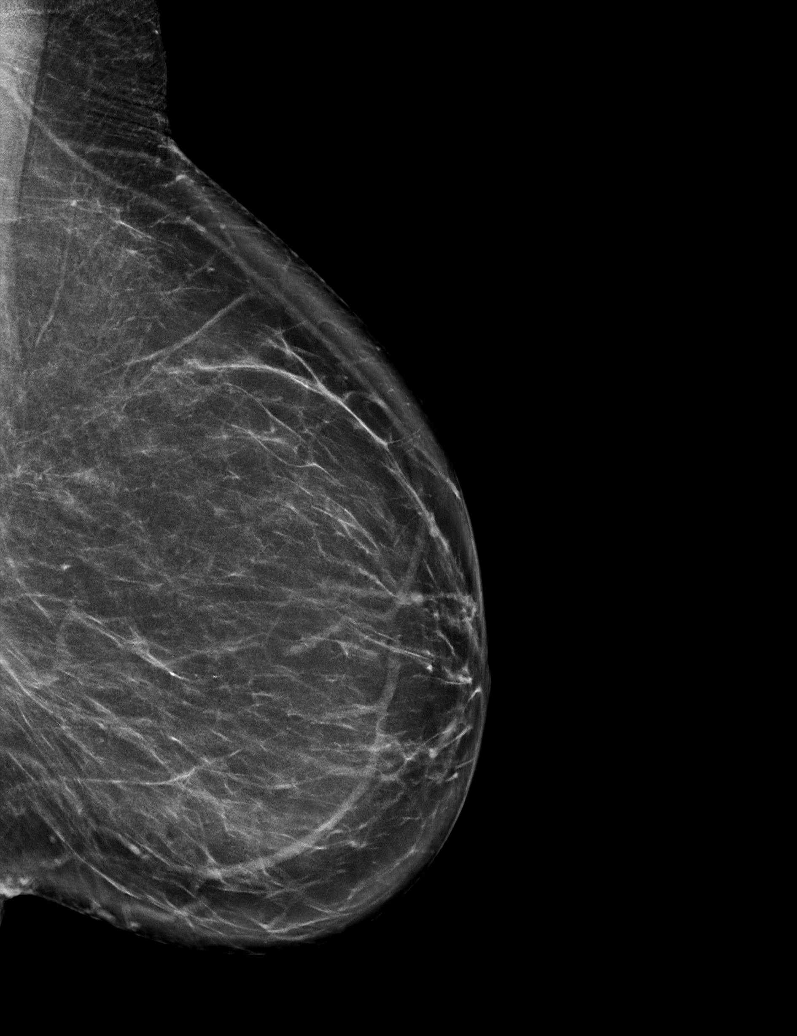

[L CC tomo · tomo slice 35/70.0]
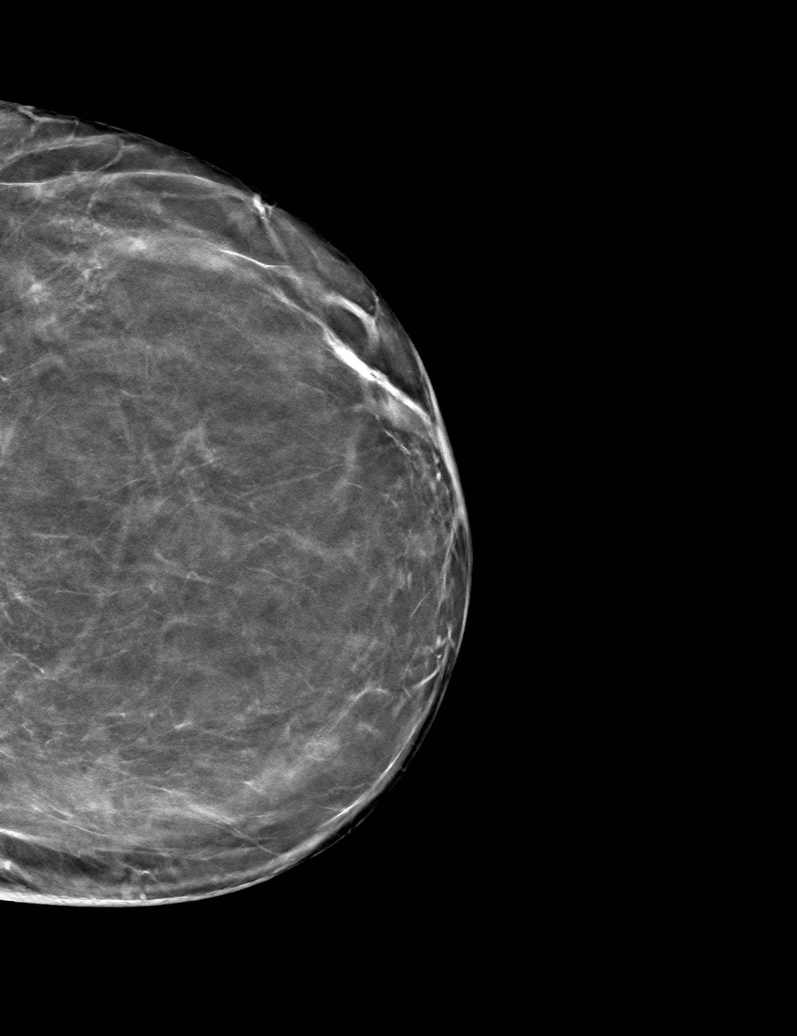

[6 of 30 positions shown; findings below may reference images not displayed]

ACR Breast Density Category b: There are scattered areas of
fibroglandular density.
FINDINGS: There are no findings suspicious for malignancy.
IMPRESSION: No mammographic evidence of malignancy. A result letter of this
screening mammogram will be mailed directly to the patient.

RECOMMENDATION:
Screening mammogram in one year. (Code:51-O-LD2)

BI-RADS CATEGORY  1: Negative.

## 2023-02-05 ENCOUNTER — Other Ambulatory Visit: Payer: Self-pay | Admitting: Family Medicine

## 2023-02-05 DIAGNOSIS — Z1231 Encounter for screening mammogram for malignant neoplasm of breast: Secondary | ICD-10-CM

## 2023-02-19 ENCOUNTER — Inpatient Hospital Stay: Admission: RE | Admit: 2023-02-19 | Payer: Medicare Other | Source: Ambulatory Visit

## 2023-04-09 ENCOUNTER — Ambulatory Visit
Admission: RE | Admit: 2023-04-09 | Discharge: 2023-04-09 | Disposition: A | Discharge status: Home / Self Care | Payer: Medicare Other | Source: Ambulatory Visit | Attending: Family Medicine | Admitting: Family Medicine

## 2023-04-09 DIAGNOSIS — Z1231 Encounter for screening mammogram for malignant neoplasm of breast: Secondary | ICD-10-CM

## 2023-04-18 ENCOUNTER — Other Ambulatory Visit: Payer: Self-pay

## 2023-04-18 ENCOUNTER — Telehealth: Payer: Self-pay | Admitting: Family Medicine

## 2023-04-18 DIAGNOSIS — G629 Polyneuropathy, unspecified: Secondary | ICD-10-CM

## 2023-04-18 MED ORDER — GABAPENTIN 100 MG PO CAPS: 100.0000 mg | ORAL_CAPSULE | Freq: Every day | ORAL | 0 refills | Status: DC

## 2023-04-18 NOTE — Telephone Encounter (Signed)
Prescription Request  04/18/2023  LOV: Visit date not found  What is the name of the medication or equipment?   gabapentin (NEURONTIN) 100 MG capsule   Have you contacted your pharmacy to request a refill? Yes  y Which pharmacy would you like this sent to?  [facility] - [place], [place] - [address] [place] [address] Phone: [phone] Fax: [phone]    Patient notified that their request is being sent to the clinical staff for review and that they should receive a response within 2 business days.   Please advise pharmacist.

## 2023-05-19 DIAGNOSIS — H43812 Vitreous degeneration, left eye: Secondary | ICD-10-CM | POA: Diagnosis not present

## 2023-05-28 NOTE — Progress Notes (Signed)
Subjective:   Nina Ramos is a 77 y.o. female who presents for Medicare Annual (Subsequent) preventive examination.  Visit Complete: Virtual  I connected with  Nina Ramos on 05/29/23 by a audio enabled telemedicine application and verified that I am speaking with the correct person using two identifiers.  Patient Location: Home  Provider Location: Home Office  I discussed the limitations of evaluation and management by telemedicine. The patient expressed understanding and agreed to proceed.  Patient Medicare AWV questionnaire was completed by the patient on 05/28/23; I have confirmed that all information answered by patient is correct and no changes since this date.  Review of Systems     Cardiac Risk Factors include: advanced age (>91men, >57 women);dyslipidemia;hypertension     Objective:    Today's Vitals   05/29/23 1523  Weight: 138 lb (62.6 kg)  Height: 5\' 4"  (1.626 m)   Body mass index is 23.69 kg/m.     05/29/2023    3:33 PM 05/16/2022    3:16 PM 05/09/2022   10:41 PM 05/09/2022    2:59 PM 04/13/2022    5:05 PM 04/07/2022    3:11 PM  Advanced Directives  Does Patient Have a Medical Advance Directive? No No No No No No  Would patient like information on creating a medical advance directive? Yes (MAU/Ambulatory/Procedural Areas - Information given) No - Patient declined No - Patient declined No - Patient declined No - Patient declined No - Patient declined    Current Medications (verified) Outpatient Encounter Medications as of 05/29/2023  Medication Sig   alendronate (FOSAMAX) 70 MG tablet Take with a full glass of water on an empty stomach.   cyclobenzaprine (FLEXERIL) 5 MG tablet Take 1 tablet (5 mg total) by mouth 2 (two) times daily as needed for muscle spasms.   fluocinolone (VANOS) 0.01 % cream APPLY ON THE SKIN TWICE DAILY   gabapentin (NEURONTIN) 100 MG capsule Take 1 capsule (100 mg total) by mouth at bedtime.   hydrochlorothiazide (HYDRODIURIL) 25 MG  tablet TAKE ONE-HALF TABLET BY  MOUTH DAILY   lidocaine (LIDODERM) 5 % Place 1 patch onto the skin daily. Remove & Discard patch within 12 hours or as directed by MD   predniSONE (DELTASONE) 20 MG tablet 3 tabs poqday 1-2, 2 tabs poqday 3-4, 1 tab poqday 5-6   simvastatin (ZOCOR) 20 MG tablet Take 1 tablet (20 mg total) by mouth at bedtime.   traMADol (ULTRAM) 50 MG tablet Take by mouth.   triamcinolone cream (KENALOG) 0.1 % Apply 1 application. topically 2 (two) times daily.   ondansetron (ZOFRAN-ODT) 4 MG disintegrating tablet Take 1 tablet (4 mg total) by mouth every 8 (eight) hours as needed for nausea or vomiting. (Patient not taking: Reported on 05/29/2023)   promethazine (PHENERGAN) 25 MG suppository Place 1 suppository (25 mg total) rectally every 6 (six) hours as needed for nausea or vomiting. (Patient not taking: Reported on 05/16/2022)   No facility-administered encounter medications on file as of 05/29/2023.    Allergies (verified) Patient has no known allergies.   History: Past Medical History:  Diagnosis Date   Chronic venous insufficiency    Hyperlipidemia    Hypertension    Osteoporosis    No past surgical history on file. Family History  Problem Relation Age of Onset   Asthma Mother    Heart disease Father    Breast cancer Paternal Aunt    Social History   Socioeconomic History   Marital status: Married  Spouse name: Not on file   Number of children: Not on file   Years of education: Not on file   Highest education level: Not on file  Occupational History   Not on file  Tobacco Use   Smoking status: Former    Passive exposure: Never   Smokeless tobacco: Former    Quit date: 02/05/1994  Vaping Use   Vaping status: Never Used  Substance and Sexual Activity   Alcohol use: Never   Drug use: Never   Sexual activity: Not on file  Other Topics Concern   Not on file  Social History Narrative   Not on file   Social Determinants of Health   Financial  Resource Strain: Low Risk  (05/29/2023)   Overall Financial Resource Strain (CARDIA)    Difficulty of Paying Living Expenses: Not hard at all  Food Insecurity: No Food Insecurity (05/29/2023)   Hunger Vital Sign    Worried About Running Out of Food in the Last Year: Never true    Ran Out of Food in the Last Year: Never true  Transportation Needs: No Transportation Needs (05/29/2023)   PRAPARE - Administrator, Civil Service (Medical): No    Lack of Transportation (Non-Medical): No  Physical Activity: Sufficiently Active (05/29/2023)   Exercise Vital Sign    Days of Exercise per Week: 5 days    Minutes of Exercise per Session: 40 min  Stress: No Stress Concern Present (05/29/2023)   [employer] of Occupational Health - Occupational Stress Questionnaire    Feeling of Stress : Not at all  Social Connections: Moderately Integrated (05/29/2023)   Social Connection and Isolation Panel [NHANES]    Frequency of Communication with Friends and Family: More than three times a week    Frequency of Social Gatherings with Friends and Family: More than three times a week    Attends Religious Services: 1 to 4 times per year    Active Member of [employer] or Organizations: No    Attends Engineer, structural: Never    Marital Status: Married    Tobacco Counseling Counseling given: Not Answered   Clinical Intake:  Pre-visit preparation completed: Yes  Pain : No/denies pain     Diabetes: No  How often do you need to have someone help you when you read instructions, pamphlets, or other written materials from your doctor or pharmacy?: 1 - Never  Interpreter Needed?: No  Information entered by :: Kandis Fantasia LPN   Activities of Daily Living    05/29/2023    3:33 PM 05/28/2023    4:45 PM  In your present state of health, do you have any difficulty performing the following activities:  Hearing? 0 0  Vision? 0 0  Difficulty concentrating or making decisions? 0 0   Walking or climbing stairs? 0 0  Dressing or bathing? 0 0  Doing errands, shopping? 0 0  Preparing Food and eating ? N N  Using the Toilet? N N  In the past six months, have you accidently leaked urine? N N  Do you have problems with loss of bowel control? N N  Managing your Medications? N N  Managing your Finances? N N  Housekeeping or managing your Housekeeping? N N    Patient Care Team: Donita Brooks, MD as PCP - General (Family Medicine)  Indicate any recent Medical Services you may have received from other than [facility] providers in the past year (date may be approximate).  Assessment:   This is a routine wellness examination for [place].  Hearing/Vision screen Hearing Screening - Comments:: Denies hearing difficulties   Vision Screening - Comments:: Wears rx glasses - up to date with routine eye exams with [facility]    Dietary issues and exercise activities discussed:     Goals Addressed             This Visit's Progress    Remain active and independent         Depression Screen    05/29/2023    3:32 PM 05/16/2022    3:12 PM 10/05/2018    3:39 PM 04/18/2016    2:07 PM 07/07/2014    3:19 PM  PHQ 2/9 Scores  PHQ - 2 Score 0 0 0 0 0  PHQ- 9 Score    0     Fall Risk    05/29/2023    3:33 PM 05/28/2023    4:45 PM 05/16/2022    3:18 PM 10/05/2018    3:39 PM 04/18/2016    2:07 PM  Fall Risk   Falls in the past year? 0 0 0 0 Yes  Number falls in past yr: 0 0 0  1  Injury with Fall? 0 0 0  Yes  Comment     bruising  Risk for fall due to : No Fall Risks  No Fall Risks    Follow up Falls prevention discussed;Education provided;Falls evaluation completed  Falls prevention discussed Falls evaluation completed     MEDICARE RISK AT HOME:  Medicare Risk at Home - 05/29/23 1533     Any stairs in or around the home? Yes    If so, are there any without handrails? No    Home free of loose throw rugs in walkways, pet beds, electrical cords, etc? Yes     Adequate lighting in your home to reduce risk of falls? Yes    Life alert? No    Use of a cane, walker or w/c? No    Grab bars in the bathroom? Yes    Shower chair or bench in shower? No    Elevated toilet seat or a handicapped toilet? No             TIMED UP AND GO:  Was the test performed?  No    Cognitive Function:        05/29/2023    3:33 PM 05/16/2022    3:20 PM  6CIT Screen  What Year? 0 points 0 points  What month? 0 points 0 points  What time? 0 points 0 points  Count back from 20 0 points 0 points  Months in reverse 2 points 4 points  Repeat phrase 0 points 0 points  Total Score 2 points 4 points    Immunizations Immunization History  Administered Date(s) Administered   Fluad Quad(high Dose 65+) 07/22/2019   Influenza, High Dose Seasonal PF 10/19/2018   Influenza, Seasonal, Injecte, Preservative Fre 11/06/2012   Influenza,inj,Quad PF,6+ Mos 10/14/2016, 01/06/2018   Influenza-Unspecified 09/18/2013   Pneumococcal Conjugate-13 10/19/2018   Pneumococcal Polysaccharide-23 09/07/2013    TDAP status: Due, Education has been provided regarding the importance of this vaccine. Advised may receive this vaccine at local pharmacy or Health Dept. Aware to provide a copy of the vaccination record if obtained from local pharmacy or Health Dept. Verbalized acceptance and understanding.  Pneumococcal vaccine status: Up to date  Covid-19 vaccine status: Information provided on how to obtain vaccines.   Qualifies  for Shingles Vaccine? Yes   Zostavax completed No   Shingrix Completed?: No.    Education has been provided regarding the importance of this vaccine. Patient has been advised to call insurance company to determine out of pocket expense if they have not yet received this vaccine. Advised may also receive vaccine at local pharmacy or Health Dept. Verbalized acceptance and understanding.  Screening Tests Health Maintenance  Topic Date Due   DTaP/Tdap/Td (1 -  Tdap) Never done   Zoster Vaccines- Shingrix (1 of 2) Never done   COVID-19 Vaccine (1 - 2023-24 season) Never done   INFLUENZA VACCINE  06/19/2023   Medicare Annual Wellness (AWV)  05/28/2024   Pneumonia Vaccine 33+ Years old  Completed   DEXA SCAN  Completed   Hepatitis C Screening  Completed   HPV VACCINES  Aged Out    Health Maintenance  Health Maintenance Due  Topic Date Due   DTaP/Tdap/Td (1 - Tdap) Never done   Zoster Vaccines- Shingrix (1 of 2) Never done   COVID-19 Vaccine (1 - 2023-24 season) Never done    Colorectal cancer screening: No longer required.   Mammogram status: Completed 04/09/23. Repeat every year  Bone Density status: Completed 03/31/17. Results reflect: Bone density results: OSTEOPOROSIS. Repeat every 2 years.  Lung Cancer Screening: (Low Dose CT Chest recommended if Age 15-80 years, 20 pack-year currently smoking OR have quit w/in 15years.) does not qualify.   Lung Cancer Screening Referral: n/a  Additional Screening:  Hepatitis C Screening: does qualify; Completed 10/19/14  Vision Screening: Recommended annual ophthalmology exams for early detection of glaucoma and other disorders of the eye. Is the patient up to date with their annual eye exam?  Yes  Who is the provider or what is the name of the office in which the patient attends annual eye exams? [facility] If pt is not established with a provider, would they like to be referred to a provider to establish care? No .   Dental Screening: Recommended annual dental exams for proper oral hygiene  Community Resource Referral / Chronic Care Management: CRR required this visit?  No   CCM required this visit?  No     Plan:     I have personally reviewed and noted the following in the patient's chart:   Medical and social history Use of alcohol, tobacco or illicit drugs  Current medications and supplements including opioid prescriptions. Patient is not currently taking opioid  prescriptions. Functional ability and status Nutritional status Physical activity Advanced directives List of other physicians Hospitalizations, surgeries, and ER visits in previous 12 months Vitals Screenings to include cognitive, depression, and falls Referrals and appointments  In addition, I have reviewed and discussed with patient certain preventive protocols, quality metrics, and best practice recommendations. A written personalized care plan for preventive services as well as general preventive health recommendations were provided to patient.     Kandis Fantasia [place], [place]   [id number]   After Visit Summary: (MyChart) Due to this being a telephonic visit, the after visit summary with patients personalized plan was offered to patient via MyChart   Nurse Notes: No concerns

## 2023-05-28 NOTE — Patient Instructions (Incomplete)
Nina Ramos , Thank you for taking time to come for your Medicare Wellness Visit. I appreciate your ongoing commitment to your health goals. Please review the following plan we discussed and let me know if I can assist you in the future.   These are the goals we discussed:  Goals      Exercise 3x per week (30 min per time)     Continue to stay active and healthy.         This is a list of the screening recommended for you and due dates:  Health Maintenance  Topic Date Due   COVID-19 Vaccine (1) Never done   DTaP/Tdap/Td vaccine (1 - Tdap) Never done   Zoster (Shingles) Vaccine (1 of 2) Never done   Medicare Annual Wellness Visit  05/17/2023   Flu Shot  06/19/2023   Pneumonia Vaccine  Completed   DEXA scan (bone density measurement)  Completed   Hepatitis C Screening  Completed   HPV Vaccine  Aged Out    Advanced directives: Information on Advanced Care Planning can be found at [facility] Advance Health Care Directives Advance Health Care Directives ([email or web address] Please bring a copy of your health care power of attorney and living will to the office to be added to your chart at your convenience.  Conditions/risks identified: Aim for 30 minutes of exercise or brisk walking, 6-8 glasses of water, and 5 servings of fruits and vegetables each day.  Next appointment: Follow up in one year for your annual wellness visit    Preventive Care 65 Years and Older, Female Preventive care refers to lifestyle choices and visits with your health care provider that can promote health and wellness. What does preventive care include? A yearly physical exam. This is also called an annual well check. Dental exams once or twice a year. Routine eye exams. Ask your health care provider how often you should have your eyes checked. Personal lifestyle choices, including: Daily care of your teeth and gums. Regular physical activity. Eating a healthy diet. Avoiding tobacco and drug  use. Limiting alcohol use. Practicing safe sex. Taking low-dose aspirin every day. Taking vitamin and mineral supplements as recommended by your health care provider. What happens during an annual well check? The services and screenings done by your health care provider during your annual well check will depend on your age, overall health, lifestyle risk factors, and family history of disease. Counseling  Your health care provider may ask you questions about your: Alcohol use. Tobacco use. Drug use. Emotional well-being. Home and relationship well-being. Sexual activity. Eating habits. History of falls. Memory and ability to understand (cognition). Work and work Astronomer. Reproductive health. Screening  You may have the following tests or measurements: Height, weight, and BMI. Blood pressure. Lipid and cholesterol levels. These may be checked every 5 years, or more frequently if you are over 63 years old. Skin check. Lung cancer screening. You may have this screening every year starting at age 39 if you have a 30-pack-year history of smoking and currently smoke or have quit within the past 15 years. Fecal occult blood test (FOBT) of the stool. You may have this test every year starting at age 62. Flexible sigmoidoscopy or colonoscopy. You may have a sigmoidoscopy every 5 years or a colonoscopy every 10 years starting at age 39. Hepatitis C blood test. Hepatitis B blood test. Sexually transmitted disease (STD) testing. Diabetes screening. This is done by checking your blood sugar (glucose) after you  have not eaten for a while (fasting). You may have this done every 1-3 years. Bone density scan. This is done to screen for osteoporosis. You may have this done starting at age 54. Mammogram. This may be done every 1-2 years. Talk to your health care provider about how often you should have regular mammograms. Talk with your health care provider about your test results, treatment  options, and if necessary, the need for more tests. Vaccines  Your health care provider may recommend certain vaccines, such as: Influenza vaccine. This is recommended every year. Tetanus, diphtheria, and acellular pertussis (Tdap, Td) vaccine. You may need a Td booster every 10 years. Zoster vaccine. You may need this after age 21. Pneumococcal 13-valent conjugate (PCV13) vaccine. One dose is recommended after age 48. Pneumococcal polysaccharide (PPSV23) vaccine. One dose is recommended after age 75. Talk to your health care provider about which screenings and vaccines you need and how often you need them. This information is not intended to replace advice given to you by your health care provider. Make sure you discuss any questions you have with your health care provider. Document Released: 12/01/2015 Document Revised: 07/24/2016 Document Reviewed: 09/05/2015 Elsevier Interactive Patient Education  2017 [employer].  Fall Prevention in the Home Falls can cause injuries. They can happen to people of all ages. There are many things you can do to make your home safe and to help prevent falls. What can I do on the outside of my home? Regularly fix the edges of walkways and driveways and fix any cracks. Remove anything that might make you trip as you walk through a door, such as a raised step or threshold. Trim any bushes or trees on the path to your home. Use bright outdoor lighting. Clear any walking paths of anything that might make someone trip, such as rocks or tools. Regularly check to see if handrails are loose or broken. Make sure that both sides of any steps have handrails. Any raised decks and porches should have guardrails on the edges. Have any leaves, snow, or ice cleared regularly. Use sand or salt on walking paths during winter. Clean up any spills in your garage right away. This includes oil or grease spills. What can I do in the bathroom? Use night lights. Install grab  bars by the toilet and in the tub and shower. Do not use towel bars as grab bars. Use non-skid mats or decals in the tub or shower. If you need to sit down in the shower, use a plastic, non-slip stool. Keep the floor dry. Clean up any water that spills on the floor as soon as it happens. Remove soap buildup in the tub or shower regularly. Attach bath mats securely with double-sided non-slip rug tape. Do not have throw rugs and other things on the floor that can make you trip. What can I do in the bedroom? Use night lights. Make sure that you have a light by your bed that is easy to reach. Do not use any sheets or blankets that are too big for your bed. They should not hang down onto the floor. Have a firm chair that has side arms. You can use this for support while you get dressed. Do not have throw rugs and other things on the floor that can make you trip. What can I do in the kitchen? Clean up any spills right away. Avoid walking on wet floors. Keep items that you use a lot in easy-to-reach places. If you need  to reach something above you, use a strong step stool that has a grab bar. Keep electrical cords out of the way. Do not use floor polish or wax that makes floors slippery. If you must use wax, use non-skid floor wax. Do not have throw rugs and other things on the floor that can make you trip. What can I do with my stairs? Do not leave any items on the stairs. Make sure that there are handrails on both sides of the stairs and use them. Fix handrails that are broken or loose. Make sure that handrails are as long as the stairways. Check any carpeting to make sure that it is firmly attached to the stairs. Fix any carpet that is loose or worn. Avoid having throw rugs at the top or bottom of the stairs. If you do have throw rugs, attach them to the floor with carpet tape. Make sure that you have a light switch at the top of the stairs and the bottom of the stairs. If you do not have them,  ask someone to add them for you. What else can I do to help prevent falls? Wear shoes that: Do not have high heels. Have rubber bottoms. Are comfortable and fit you well. Are closed at the toe. Do not wear sandals. If you use a stepladder: Make sure that it is fully opened. Do not climb a closed stepladder. Make sure that both sides of the stepladder are locked into place. Ask someone to hold it for you, if possible. Clearly mark and make sure that you can see: Any grab bars or handrails. First and last steps. Where the edge of each step is. Use tools that help you move around (mobility aids) if they are needed. These include: Canes. Walkers. Scooters. Crutches. Turn on the lights when you go into a dark area. Replace any light bulbs as soon as they burn out. Set up your furniture so you have a clear path. Avoid moving your furniture around. If any of your floors are uneven, fix them. If there are any pets around you, be aware of where they are. Review your medicines with your doctor. Some medicines can make you feel dizzy. This can increase your chance of falling. Ask your doctor what other things that you can do to help prevent falls. This information is not intended to replace advice given to you by your health care provider. Make sure you discuss any questions you have with your health care provider. Document Released: 08/31/2009 Document Revised: 04/11/2016 Document Reviewed: 12/09/2014 Elsevier Interactive Patient Education  2017 [employer].

## 2023-05-29 ENCOUNTER — Ambulatory Visit (INDEPENDENT_AMBULATORY_CARE_PROVIDER_SITE_OTHER): Payer: Medicare Other

## 2023-05-29 VITALS — Ht 64.0 in | Wt 138.0 lb

## 2023-05-29 DIAGNOSIS — Z0000 Encounter for general adult medical examination without abnormal findings: Secondary | ICD-10-CM | POA: Diagnosis not present

## 2023-06-19 ENCOUNTER — Other Ambulatory Visit: Payer: Self-pay | Admitting: Family Medicine

## 2023-06-19 DIAGNOSIS — G629 Polyneuropathy, unspecified: Secondary | ICD-10-CM

## 2023-06-19 NOTE — Telephone Encounter (Signed)
Requested Prescriptions  Pending Prescriptions Disp Refills   gabapentin (NEURONTIN) 100 MG capsule [Pharmacy Med Name: Gabapentin 100 MG Oral Capsule] 90 capsule 0    Sig: TAKE 1 CAPSULE BY MOUTH AT  BEDTIME     Neurology: Anticonvulsants - gabapentin Failed - 06/19/2023  4:31 AM      Failed - Cr in normal range and within 360 days    Creat  Date Value Ref Range Status  01/24/2022 1.02 (H) 0.60 - 1.00 mg/dL Final   Creatinine, Ser  Date Value Ref Range Status  05/09/2022 0.96 0.44 - 1.00 mg/dL Final         Failed - Valid encounter within last 12 months    Recent Outpatient Visits           1 year ago Urine frequency   [facility] Donita Brooks, MD   1 year ago Benign essential HTN   [facility] [place], Priscille Heidelberg, MD   3 years ago Essential hypertension   [facility] [place], Velna Hatchet, MD   4 years ago Essential hypertension   [facility] [place], Velna Hatchet, MD   4 years ago Essential hypertension   [facility] [place], Velna Hatchet, MD              Passed - Completed PHQ-2 or PHQ-9 in the last 360 days

## 2023-06-28 ENCOUNTER — Other Ambulatory Visit: Payer: Self-pay | Admitting: Family Medicine

## 2023-06-30 NOTE — Telephone Encounter (Signed)
Requested medication (s) are due for refill today: yes  Requested medication (s) are on the active medication list: yes    Last refill: 04/04/21  #45  0 refills  Future visit scheduled AWV 06/03/24  Notes to clinic:Failed due to labs, please review. Thank you.  Requested Prescriptions  Pending Prescriptions Disp Refills   hydrochlorothiazide (HYDRODIURIL) 25 MG tablet [Pharmacy Med Name: hydroCHLOROthiazide 25 MG Oral Tablet] 45 tablet 3    Sig: TAKE ONE-HALF TABLET BY  MOUTH DAILY     Cardiovascular: Diuretics - Thiazide Failed - 06/28/2023 12:44 PM      Failed - Cr in normal range and within 180 days    Creat  Date Value Ref Range Status  01/24/2022 1.02 (H) 0.60 - 1.00 mg/dL Final   Creatinine, Ser  Date Value Ref Range Status  05/09/2022 0.96 0.44 - 1.00 mg/dL Final         Failed - K in normal range and within 180 days    Potassium  Date Value Ref Range Status  05/09/2022 3.7 3.5 - 5.1 mmol/L Final         Failed - Na in normal range and within 180 days    Sodium  Date Value Ref Range Status  05/09/2022 138 135 - 145 mmol/L Final         Failed - Valid encounter within last 6 months    Recent Outpatient Visits           1 year ago Urine frequency   [facility] Donita Brooks, MD   1 year ago Benign essential HTN   [facility] Donita Brooks, MD   3 years ago Essential hypertension   [facility] [place], Velna Hatchet, MD   4 years ago Essential hypertension   [facility] [place], Velna Hatchet, MD   4 years ago Essential hypertension   [facility] [place], Velna Hatchet, MD              Passed - Last BP in normal range    BP Readings from Last 1 Encounters:  05/09/22 139/66

## 2023-07-14 DIAGNOSIS — H524 Presbyopia: Secondary | ICD-10-CM | POA: Diagnosis not present

## 2023-07-14 DIAGNOSIS — H40013 Open angle with borderline findings, low risk, bilateral: Secondary | ICD-10-CM | POA: Diagnosis not present

## 2023-07-18 ENCOUNTER — Other Ambulatory Visit: Payer: Self-pay

## 2023-07-18 ENCOUNTER — Telehealth: Payer: Self-pay | Admitting: Family Medicine

## 2023-07-18 DIAGNOSIS — I10 Essential (primary) hypertension: Secondary | ICD-10-CM

## 2023-07-18 MED ORDER — HYDROCHLOROTHIAZIDE 25 MG PO TABS: 12.5000 mg | ORAL_TABLET | Freq: Every day | ORAL | 0 refills | Status: DC

## 2023-07-18 NOTE — Telephone Encounter (Signed)
Prescription Request  07/18/2023  LOV: 04/09/2022  What is the name of the medication or equipment?   hydrochlorothiazide (HYDRODIURIL) 25 MG tablet  **Pt out of pills;requesting courtesy refill. Med refill appt scheduled for 07/25/23**  Have you contacted your pharmacy to request a refill? Yes   Which pharmacy would you like this sent to?  [id number] - [place], [place] - [address] [place] [address] Phone: [phone] Fax: [phone]    Patient notified that their request is being sent to the clinical staff for review and that they should receive a response within 2 business days.   Please advise patient at [phone].

## 2023-07-18 NOTE — Telephone Encounter (Signed)
Patient called back to reschedule med refill appt to 9/13; will be out of town 07/25/23.

## 2023-07-23 NOTE — Telephone Encounter (Signed)
Pharmacist sent fax to request new script for HYDROCHLOROTHIAZIDE 25 MG TAB  Pharmacy:   [id number] - [place], [place] - [address] [place], [place] [address] Phone: [phone]  Fax: [phone] DEA #: [id number]   Please advise pharmacist.

## 2023-07-24 ENCOUNTER — Other Ambulatory Visit: Payer: Self-pay

## 2023-07-24 DIAGNOSIS — I10 Essential (primary) hypertension: Secondary | ICD-10-CM

## 2023-07-24 MED ORDER — HYDROCHLOROTHIAZIDE 25 MG PO TABS: 12.5000 mg | ORAL_TABLET | Freq: Every day | ORAL | 0 refills | Status: DC

## 2023-07-25 ENCOUNTER — Ambulatory Visit: Payer: Medicare Other | Admitting: Family Medicine

## 2023-08-01 ENCOUNTER — Ambulatory Visit: Payer: Medicare Other | Admitting: Family Medicine

## 2023-08-04 ENCOUNTER — Ambulatory Visit (INDEPENDENT_AMBULATORY_CARE_PROVIDER_SITE_OTHER): Payer: Medicare Other | Admitting: Family Medicine

## 2023-08-04 ENCOUNTER — Encounter: Payer: Self-pay | Admitting: Family Medicine

## 2023-08-04 VITALS — BP 132/68 | HR 86 | Temp 97.7°F | Ht 64.0 in | Wt 129.6 lb

## 2023-08-04 DIAGNOSIS — Z23 Encounter for immunization: Secondary | ICD-10-CM

## 2023-08-04 DIAGNOSIS — I10 Essential (primary) hypertension: Secondary | ICD-10-CM

## 2023-08-04 MED ORDER — LOSARTAN POTASSIUM 50 MG PO TABS: 50.0000 mg | ORAL_TABLET | Freq: Every day | ORAL | 3 refills | Status: DC

## 2023-08-04 NOTE — Progress Notes (Signed)
Subjective:    Patient ID: Nina Ramos, female    DOB: 1946-11-09, 77 y.o.   MRN: [id number]  HPI   Patient is a very pleasant 77 year-old Caucasian female who is here today to recheck her blood pressure.  She is currently on hydrochlorothiazide.  She does report cramps at night.  She states that she has cramps on the backs of her legs every evening around 6 AM.  They can be severe at times.  This happens almost on a daily basis.  She denies any chest pain shortness of breath or dyspnea on exertion Past Medical History:  Diagnosis Date   Chronic venous insufficiency    Hyperlipidemia    Hypertension    Osteoporosis     No past surgical history on file. Current Outpatient Medications on File Prior to Visit  Medication Sig Dispense Refill   alendronate (FOSAMAX) 70 MG tablet Take with a full glass of water on an empty stomach. 12 tablet 0   cyclobenzaprine (FLEXERIL) 5 MG tablet Take 1 tablet (5 mg total) by mouth 2 (two) times daily as needed for muscle spasms. 10 tablet 0   fluocinolone (VANOS) 0.01 % cream APPLY ON THE SKIN TWICE DAILY 30 g 3   gabapentin (NEURONTIN) 100 MG capsule TAKE 1 CAPSULE BY MOUTH AT  BEDTIME 90 capsule 0   hydrochlorothiazide (HYDRODIURIL) 25 MG tablet Take 0.5 tablets (12.5 mg total) by mouth daily. 45 tablet 0   lidocaine (LIDODERM) 5 % Place 1 patch onto the skin daily. Remove & Discard patch within 12 hours or as directed by MD 5 patch 0   ondansetron (ZOFRAN-ODT) 4 MG disintegrating tablet Take 1 tablet (4 mg total) by mouth every 8 (eight) hours as needed for nausea or vomiting. (Patient not taking: Reported on 05/29/2023) 20 tablet 0   predniSONE (DELTASONE) 20 MG tablet 3 tabs poqday 1-2, 2 tabs poqday 3-4, 1 tab poqday 5-6 12 tablet 0   promethazine (PHENERGAN) 25 MG suppository Place 1 suppository (25 mg total) rectally every 6 (six) hours as needed for nausea or vomiting. (Patient not taking: Reported on 05/16/2022) 12 each 0   simvastatin (ZOCOR)  20 MG tablet Take 1 tablet (20 mg total) by mouth at bedtime. 90 tablet 0   traMADol (ULTRAM) 50 MG tablet Take by mouth.     triamcinolone cream (KENALOG) 0.1 % Apply 1 application. topically 2 (two) times daily. 453.6 g 2   No current facility-administered medications on file prior to visit.   Social History   Socioeconomic History   Marital status: Married    Spouse name: Not on file   Number of children: Not on file   Years of education: Not on file   Highest education level: Not on file  Occupational History   Not on file  Tobacco Use   Smoking status: Former    Passive exposure: Never   Smokeless tobacco: Former    Quit date: 02/05/1994  Vaping Use   Vaping status: Never Used  Substance and Sexual Activity   Alcohol use: Never   Drug use: Never   Sexual activity: Not on file  Other Topics Concern   Not on file  Social History Narrative   Not on file   Social Determinants of Health   Financial Resource Strain: Low Risk  (05/29/2023)   Overall Financial Resource Strain (CARDIA)    Difficulty of Paying Living Expenses: Not hard at all  Food Insecurity: No Food Insecurity (05/29/2023)  Hunger Vital Sign    Worried About Running Out of Food in the Last Year: Never true    Ran Out of Food in the Last Year: Never true  Transportation Needs: No Transportation Needs (05/29/2023)   PRAPARE - Administrator, Civil Service (Medical): No    Lack of Transportation (Non-Medical): No  Physical Activity: Sufficiently Active (05/29/2023)   Exercise Vital Sign    Days of Exercise per Week: 5 days    Minutes of Exercise per Session: 40 min  Stress: No Stress Concern Present (05/29/2023)   [employer] of Occupational Health - Occupational Stress Questionnaire    Feeling of Stress : Not at all  Social Connections: Moderately Integrated (05/29/2023)   Social Connection and Isolation Panel [NHANES]    Frequency of Communication with Friends and Family: More than three  times a week    Frequency of Social Gatherings with Friends and Family: More than three times a week    Attends Religious Services: 1 to 4 times per year    Active Member of [employer] or Organizations: No    Attends Banker Meetings: Never    Marital Status: Married  Catering manager Violence: Not At Risk (05/29/2023)   Humiliation, Afraid, Rape, and Kick questionnaire    Fear of Current or Ex-Partner: No    Emotionally Abused: No    Physically Abused: No    Sexually Abused: No   Family History  Problem Relation Age of Onset   Asthma Mother    Heart disease Father    Breast cancer Paternal Aunt      Review of Systems  All other systems reviewed and are negative.      Objective:   Physical Exam Constitutional:      General: She is not in acute distress.    Appearance: Normal appearance. She is normal weight. She is not ill-appearing or toxic-appearing.  Cardiovascular:     Rate and Rhythm: Normal rate and regular rhythm.     Heart sounds: Normal heart sounds. No murmur heard.    No friction rub. No gallop.  Pulmonary:     Effort: Pulmonary effort is normal. No respiratory distress.     Breath sounds: Normal breath sounds. No stridor. No wheezing or rales.  Abdominal:     General: Bowel sounds are normal. There is no distension.     Palpations: Abdomen is soft. There is no mass.     Tenderness: There is no abdominal tenderness. There is no guarding.  Musculoskeletal:     Right lower leg: No edema.     Left lower leg: No edema.  Neurological:     Mental Status: She is alert.         Assessment & Plan:   Primary hypertension - Plan: Lipid panel, COMPLETE METABOLIC PANEL WITH GFR, CBC with Differential/Platelet  Flu vaccine need - Plan: Flu vaccine trivalent PF, 6mos and older(Flulaval,Afluria,Fluarix,Fluzone) Begin by obtaining fasting lab work including a CBC CMP and a lipid panel.  Patient received her flu shot today.  Recommended switching  hydrochlorothiazide to losartan 50 mg a day in case hypokalemia is causing her cramps

## 2023-08-05 LAB — CBC WITH DIFFERENTIAL/PLATELET
Absolute Monocytes: 508 {cells}/uL (ref 200–950)
Basophils Absolute: 21 {cells}/uL (ref 0–200)
Basophils Relative: 0.5 %
Eosinophils Absolute: 62 {cells}/uL (ref 15–500)
Eosinophils Relative: 1.5 %
HCT: 42.1 % (ref 35.0–45.0)
Hemoglobin: 14.1 g/dL (ref 11.7–15.5)
Lymphs Abs: 1595 {cells}/uL (ref 850–3900)
MCH: 32.3 pg (ref 27.0–33.0)
MCHC: 33.5 g/dL (ref 32.0–36.0)
MCV: 96.3 fL (ref 80.0–100.0)
MPV: 10.8 fL (ref 7.5–12.5)
Monocytes Relative: 12.4 %
Neutro Abs: 1915 {cells}/uL (ref 1500–7800)
Neutrophils Relative %: 46.7 %
Platelets: 244 10*3/uL (ref 140–400)
RBC: 4.37 10*6/uL (ref 3.80–5.10)
RDW: 12.4 % (ref 11.0–15.0)
Total Lymphocyte: 38.9 %
WBC: 4.1 10*3/uL (ref 3.8–10.8)

## 2023-08-05 LAB — COMPLETE METABOLIC PANEL WITH GFR
AG Ratio: 1.9 (calc) (ref 1.0–2.5)
ALT: 14 U/L (ref 6–29)
AST: 20 U/L (ref 10–35)
Albumin: 4.3 g/dL (ref 3.6–5.1)
Alkaline phosphatase (APISO): 44 U/L (ref 37–153)
BUN/Creatinine Ratio: 24 (calc) — ABNORMAL HIGH (ref 6–22)
BUN: 25 mg/dL (ref 7–25)
CO2: 30 mmol/L (ref 20–32)
Calcium: 9.3 mg/dL (ref 8.6–10.4)
Chloride: 103 mmol/L (ref 98–110)
Creat: 1.04 mg/dL — ABNORMAL HIGH (ref 0.60–1.00)
Globulin: 2.3 g/dL (ref 1.9–3.7)
Glucose, Bld: 96 mg/dL (ref 65–99)
Potassium: 3.9 mmol/L (ref 3.5–5.3)
Sodium: 141 mmol/L (ref 135–146)
Total Bilirubin: 0.5 mg/dL (ref 0.2–1.2)
Total Protein: 6.6 g/dL (ref 6.1–8.1)
eGFR: 55 mL/min/{1.73_m2} — ABNORMAL LOW (ref >=60)

## 2023-08-05 LAB — LIPID PANEL
Cholesterol: 208 mg/dL — ABNORMAL HIGH (ref <200)
HDL: 54 mg/dL (ref >=50)
LDL Cholesterol (Calc): 129 mg/dL — ABNORMAL HIGH
Non-HDL Cholesterol (Calc): 154 mg/dL — ABNORMAL HIGH (ref <130)
Total CHOL/HDL Ratio: 3.9 (calc) (ref <5.0)
Triglycerides: 140 mg/dL (ref <150)

## 2023-09-12 ENCOUNTER — Other Ambulatory Visit: Payer: Self-pay | Admitting: Family Medicine

## 2023-09-12 DIAGNOSIS — G629 Polyneuropathy, unspecified: Secondary | ICD-10-CM

## 2023-09-12 NOTE — Telephone Encounter (Signed)
Last OV 08/04/23

## 2023-09-15 ENCOUNTER — Telehealth: Payer: Self-pay | Admitting: Family Medicine

## 2023-09-15 NOTE — Telephone Encounter (Signed)
Patient dropped off blood pressure reading log; placed on nurse's desk.   Patient requesting call back with best advise. Since blood pressure readings aren't high, patient unsure if she should keep taking Losartan Potassium 50MG  for the benefit of the potassium (advised to stop taking hydrochlorothiazide).  Please advise at [phone].

## 2023-09-16 ENCOUNTER — Other Ambulatory Visit: Payer: Self-pay

## 2024-06-03 ENCOUNTER — Ambulatory Visit: Payer: Medicare Other

## 2024-07-13 ENCOUNTER — Telehealth: Payer: Self-pay

## 2024-07-13 NOTE — Telephone Encounter (Signed)
 Copied from CRM [id number]. Topic: Appointments - Scheduling Inquiry for Clinic >> Jul 13, 2024  2:56 PM Gustabo D wrote: Call patient's daughter because she says Dr. Justina said she would see her mom as a new pt. [name] Bar [phone]

## 2024-07-13 NOTE — Telephone Encounter (Signed)
 NA/NVM to schedule apt

## 2024-08-06 ENCOUNTER — Other Ambulatory Visit: Payer: Self-pay

## 2024-08-06 ENCOUNTER — Emergency Department (HOSPITAL_BASED_OUTPATIENT_CLINIC_OR_DEPARTMENT_OTHER)

## 2024-08-06 ENCOUNTER — Observation Stay (HOSPITAL_BASED_OUTPATIENT_CLINIC_OR_DEPARTMENT_OTHER)
Admission: EM | Admit: 2024-08-06 | Discharge: 2024-08-08 | Disposition: A | Discharge status: Home / Self Care | Attending: Internal Medicine | Admitting: Internal Medicine

## 2024-08-06 DIAGNOSIS — J90 Pleural effusion, not elsewhere classified: Secondary | ICD-10-CM | POA: Insufficient documentation

## 2024-08-06 DIAGNOSIS — N9489 Other specified conditions associated with female genital organs and menstrual cycle: Secondary | ICD-10-CM | POA: Diagnosis present

## 2024-08-06 DIAGNOSIS — D751 Secondary polycythemia: Secondary | ICD-10-CM | POA: Diagnosis not present

## 2024-08-06 DIAGNOSIS — G5790 Unspecified mononeuropathy of unspecified lower limb: Secondary | ICD-10-CM | POA: Diagnosis not present

## 2024-08-06 DIAGNOSIS — R1032 Left lower quadrant pain: Secondary | ICD-10-CM | POA: Diagnosis not present

## 2024-08-06 DIAGNOSIS — E785 Hyperlipidemia, unspecified: Secondary | ICD-10-CM | POA: Diagnosis present

## 2024-08-06 DIAGNOSIS — N838 Other noninflammatory disorders of ovary, fallopian tube and broad ligament: Secondary | ICD-10-CM | POA: Diagnosis not present

## 2024-08-06 DIAGNOSIS — Z79899 Other long term (current) drug therapy: Secondary | ICD-10-CM | POA: Diagnosis not present

## 2024-08-06 DIAGNOSIS — R531 Weakness: Secondary | ICD-10-CM | POA: Diagnosis not present

## 2024-08-06 DIAGNOSIS — R52 Pain, unspecified: Secondary | ICD-10-CM

## 2024-08-06 DIAGNOSIS — R112 Nausea with vomiting, unspecified: Secondary | ICD-10-CM | POA: Diagnosis not present

## 2024-08-06 DIAGNOSIS — R6883 Chills (without fever): Secondary | ICD-10-CM | POA: Insufficient documentation

## 2024-08-06 DIAGNOSIS — N179 Acute kidney failure, unspecified: Principal | ICD-10-CM | POA: Insufficient documentation

## 2024-08-06 DIAGNOSIS — I10 Essential (primary) hypertension: Secondary | ICD-10-CM | POA: Diagnosis not present

## 2024-08-06 DIAGNOSIS — R188 Other ascites: Secondary | ICD-10-CM | POA: Diagnosis not present

## 2024-08-06 DIAGNOSIS — Z87891 Personal history of nicotine dependence: Secondary | ICD-10-CM | POA: Insufficient documentation

## 2024-08-06 DIAGNOSIS — N83291 Other ovarian cyst, right side: Secondary | ICD-10-CM | POA: Insufficient documentation

## 2024-08-06 DIAGNOSIS — Z8669 Personal history of other diseases of the nervous system and sense organs: Secondary | ICD-10-CM | POA: Insufficient documentation

## 2024-08-06 DIAGNOSIS — K652 Spontaneous bacterial peritonitis: Secondary | ICD-10-CM | POA: Insufficient documentation

## 2024-08-06 DIAGNOSIS — N2889 Other specified disorders of kidney and ureter: Secondary | ICD-10-CM | POA: Diagnosis not present

## 2024-08-06 DIAGNOSIS — R109 Unspecified abdominal pain: Secondary | ICD-10-CM | POA: Diagnosis present

## 2024-08-06 DIAGNOSIS — F411 Generalized anxiety disorder: Secondary | ICD-10-CM | POA: Diagnosis not present

## 2024-08-06 DIAGNOSIS — R932 Abnormal findings on diagnostic imaging of liver and biliary tract: Secondary | ICD-10-CM | POA: Diagnosis not present

## 2024-08-06 DIAGNOSIS — K5730 Diverticulosis of large intestine without perforation or abscess without bleeding: Secondary | ICD-10-CM | POA: Diagnosis not present

## 2024-08-06 DIAGNOSIS — G629 Polyneuropathy, unspecified: Secondary | ICD-10-CM

## 2024-08-06 DIAGNOSIS — M79605 Pain in left leg: Secondary | ICD-10-CM | POA: Diagnosis not present

## 2024-08-06 DIAGNOSIS — R110 Nausea: Secondary | ICD-10-CM | POA: Diagnosis not present

## 2024-08-06 LAB — COMPREHENSIVE METABOLIC PANEL WITH GFR
ALT: 16 U/L (ref 0–44)
AST: 38 U/L (ref 15–41)
Albumin: 4.5 g/dL (ref 3.5–5.0)
Alkaline Phosphatase: 59 U/L (ref 38–126)
Anion gap: 15 (ref 5–15)
BUN: 36 mg/dL — ABNORMAL HIGH (ref 8–23)
CO2: 21 mmol/L — ABNORMAL LOW (ref 22–32)
Calcium: 9.5 mg/dL (ref 8.9–10.3)
Chloride: 102 mmol/L (ref 98–111)
Creatinine, Ser: 1.78 mg/dL — ABNORMAL HIGH (ref 0.44–1.00)
GFR, Estimated: 29 mL/min — ABNORMAL LOW (ref >60)
Glucose, Bld: 178 mg/dL — ABNORMAL HIGH (ref 70–99)
Potassium: 4.3 mmol/L (ref 3.5–5.1)
Sodium: 138 mmol/L (ref 135–145)
Total Bilirubin: 0.9 mg/dL (ref 0.0–1.2)
Total Protein: 6.9 g/dL (ref 6.5–8.1)

## 2024-08-06 LAB — CBC WITH DIFFERENTIAL/PLATELET
Abs Immature Granulocytes: 0.02 K/uL (ref 0.00–0.07)
Basophils Absolute: 0 K/uL (ref 0.0–0.1)
Basophils Relative: 0 %
Eosinophils Absolute: 0 K/uL (ref 0.0–0.5)
Eosinophils Relative: 0 %
HCT: 46.3 % — ABNORMAL HIGH (ref 36.0–46.0)
Hemoglobin: 15.6 g/dL — ABNORMAL HIGH (ref 12.0–15.0)
Immature Granulocytes: 0 %
Lymphocytes Relative: 11 %
Lymphs Abs: 0.7 K/uL (ref 0.7–4.0)
MCH: 32 pg (ref 26.0–34.0)
MCHC: 33.7 g/dL (ref 30.0–36.0)
MCV: 95.1 fL (ref 80.0–100.0)
Monocytes Absolute: 0.6 K/uL (ref 0.1–1.0)
Monocytes Relative: 10 %
Neutro Abs: 4.9 K/uL (ref 1.7–7.7)
Neutrophils Relative %: 79 %
Platelets: 262 K/uL (ref 150–400)
RBC: 4.87 MIL/uL (ref 3.87–5.11)
RDW: 14.2 % (ref 11.5–15.5)
WBC: 6.3 K/uL (ref 4.0–10.5)
nRBC: 0 % (ref 0.0–0.2)

## 2024-08-06 LAB — URINALYSIS, ROUTINE W REFLEX MICROSCOPIC
Bacteria, UA: NONE SEEN
Bilirubin Urine: NEGATIVE
Glucose, UA: NEGATIVE mg/dL
Hgb urine dipstick: NEGATIVE
Nitrite: NEGATIVE
Protein, ur: 30 mg/dL — AB
Specific Gravity, Urine: 1.032 — ABNORMAL HIGH (ref 1.005–1.030)
pH: 5 (ref 5.0–8.0)

## 2024-08-06 LAB — RESP PANEL BY RT-PCR (RSV, FLU A&B, COVID)  RVPGX2
Influenza A by PCR: NEGATIVE
Influenza B by PCR: NEGATIVE
Resp Syncytial Virus by PCR: NEGATIVE
SARS Coronavirus 2 by RT PCR: NEGATIVE

## 2024-08-06 MED ORDER — MORPHINE SULFATE (PF) 4 MG/ML IV SOLN
4.0000 mg | Freq: Once | INTRAVENOUS | Status: AC
  Administered 2024-08-06: 4 mg via INTRAVENOUS
  Filled 2024-08-06: qty 1

## 2024-08-06 MED ORDER — SODIUM CHLORIDE 0.9 % IV SOLN: INTRAVENOUS | Status: DC

## 2024-08-06 MED ORDER — ONDANSETRON HCL 4 MG/2ML IJ SOLN
4.0000 mg | Freq: Once | INTRAMUSCULAR | Status: AC
  Administered 2024-08-06: 4 mg via INTRAVENOUS
  Filled 2024-08-06: qty 2

## 2024-08-06 MED ORDER — FENTANYL CITRATE PF 50 MCG/ML IJ SOSY
25.0000 ug | PREFILLED_SYRINGE | Freq: Once | INTRAMUSCULAR | Status: AC
  Administered 2024-08-06: 25 ug via INTRAVENOUS
  Filled 2024-08-06: qty 1

## 2024-08-06 MED ORDER — SODIUM CHLORIDE 0.9 % IV BOLUS
1000.0000 mL | Freq: Once | INTRAVENOUS | Status: AC
  Administered 2024-08-06: 1000 mL via INTRAVENOUS

## 2024-08-06 MED ORDER — METOCLOPRAMIDE HCL 5 MG/ML IJ SOLN
10.0000 mg | Freq: Once | INTRAMUSCULAR | Status: AC
  Administered 2024-08-06: 10 mg via INTRAVENOUS
  Filled 2024-08-06: qty 2

## 2024-08-06 MED ORDER — SODIUM CHLORIDE 0.9 % IV SOLN
25.0000 mg | INTRAVENOUS | Status: DC | PRN
  Filled 2024-08-06: qty 0.5

## 2024-08-06 NOTE — Progress Notes (Signed)
 Plan of Care Note for accepted transfer   Patient: Nina Ramos MRN: [id number]   DOA: 08/06/2024  Facility requesting transfer: [facility]   Requesting Provider: Sidra Ruby, PA  Reason for transfer: Intractable abdominal pain   Facility course: 78 yr old female with HTN and HLD presents with lower abdominal pain. SCr is 1.78 (1.04 one year ago) and CT is concerning for right adnexal mass and ascites.   She was given IVF, Zofran  x2, fentanyl  x2, and morphine . Pelvic US  is ordered.   Plan of care: The patient is accepted for admission to Med-surg  unit, at [facility]..   Author: Evalene GORMAN Sprinkles, MD 08/06/2024  Check [email or web address] for on-call coverage.  Nursing staff, Please call [facility] Admits & Consults System-Wide number on Amion as soon as patient's arrival, so appropriate admitting provider can evaluate the pt.

## 2024-08-06 NOTE — ED Provider Notes (Signed)
 [place] [facility] DE[facility]MENT AT [facility] Provider Note   CSN: [id number] Arrival date & time: 08/06/24  1120     Patient presents with: Abdominal Pain   Nina Ramos is a 78 y.o. female.   Patient with no past surgical history on the abdomen presents to the emergency department today for evaluation of chills and feeling hot overnight last night with mild diarrhea, nonbloody.  She has had some nasal congestion, without documented fever or cough.  Family member did administer a COVID test at home which was negative.  No urinary symptoms.  Patient reports chronic lower extremity neuropathy which was also worse than usual last night.  She was on gabapentin  for this in the past, but not currently.  She applies topical medications which helped temporarily.  She did have some lower abdominal pain yesterday after drinking prune juice.  She states that she was eating a lot of cheese which makes her constipated.  She states that the abdominal pain is improved currently.       Prior to Admission medications   Medication Sig Start Date End Date Taking? Authorizing Provider  alendronate  (FOSAMAX ) 70 MG tablet Take with a full glass of water on an empty stomach. 08/22/21   Duanne Butler [name], MD  cyclobenzaprine  (FLEXERIL ) 5 MG tablet Take 1 tablet (5 mg total) by mouth 2 (two) times daily as needed for muscle spasms. Patient not taking: Reported on 08/04/2023 04/13/22   Freddi Hamilton, MD  fluocinolone  (VANOS) 0.01 % cream APPLY ON THE SKIN TWICE DAILY 03/09/19   Bari Theodoro FALCON, MD  gabapentin  (NEURONTIN ) 100 MG capsule TAKE 1 CAPSULE BY MOUTH AT  BEDTIME 09/12/23   Duanne Butler [name], MD  ondansetron  (ZOFRAN -ODT) 4 MG disintegrating tablet Take 1 tablet (4 mg total) by mouth every 8 (eight) hours as needed for nausea or vomiting. 05/09/22   Roselyn Carlin NOVAK, MD    Allergies: Patient has no known allergies.    Review of Systems  Updated Vital Signs BP (!) 115/50   Pulse 86    Temp (!) 97.4 F (36.3 C) (Oral)   Resp 14   SpO2 100%   Physical Exam Vitals and nursing note reviewed.  Constitutional:      General: She is not in acute distress.    Appearance: She is well-developed.  HENT:     Head: Normocephalic and atraumatic.     Jaw: No trismus.     Right Ear: Tympanic membrane, ear canal and external ear normal.     Left Ear: Tympanic membrane, ear canal and external ear normal.     Nose: Nose normal. No mucosal edema or rhinorrhea.     Mouth/Throat:     Mouth: Mucous membranes are moist. Mucous membranes are not dry. No oral lesions.     Pharynx: Uvula midline. No oropharyngeal exudate, posterior oropharyngeal erythema or uvula swelling.     Tonsils: No tonsillar abscesses.  Eyes:     General:        Right eye: No discharge.        Left eye: No discharge.     Conjunctiva/sclera: Conjunctivae normal.  Cardiovascular:     Rate and Rhythm: Normal rate and regular rhythm.     Heart sounds: Normal heart sounds. No murmur heard. Pulmonary:     Effort: Pulmonary effort is normal. No respiratory distress.     Breath sounds: Normal breath sounds. No wheezing, rhonchi or rales.     Comments: Lung sounds clear to  auscultation bilaterally. Abdominal:     Palpations: Abdomen is soft.     Tenderness: There is no abdominal tenderness. There is no guarding or rebound.     Comments: No significant tenderness on palpation of the abdomen  Musculoskeletal:     Cervical back: Normal range of motion and neck supple.     Right lower leg: No edema.     Left lower leg: No edema.  Lymphadenopathy:     Cervical: No cervical adenopathy.  Skin:    General: Skin is warm and dry.     Findings: No rash.     Comments: Lower extremities appear normal without signs of cellulitis.  Patient does have a chronic red patch on her anterior ankle which is unchanged.  Neurological:     General: No focal deficit present.     Mental Status: She is alert. Mental status is at baseline.      Motor: No weakness.  Psychiatric:        Mood and Affect: Mood normal.     (all labs ordered are listed, but only abnormal results are displayed) Labs Reviewed  CBC WITH DIFFERENTIAL/PLATELET - Abnormal; Notable for the following components:      Result Value   Hemoglobin 15.6 (*)    HCT 46.3 (*)    All other components within normal limits  COMPREHENSIVE METABOLIC PANEL WITH GFR - Abnormal; Notable for the following components:   CO2 21 (*)    Glucose, Bld 178 (*)    BUN 36 (*)    Creatinine, Ser 1.78 (*)    GFR, Estimated 29 (*)    All other components within normal limits  URINALYSIS, ROUTINE W REFLEX MICROSCOPIC - Abnormal; Notable for the following components:   APPearance HAZY (*)    Specific Gravity, Urine 1.032 (*)    Ketones, ur TRACE (*)    Protein, ur 30 (*)    Leukocytes,Ua TRACE (*)    All other components within normal limits  RESP PANEL BY RT-PCR (RSV, FLU A&B, COVID)  [id number]  URINE CULTURE    EKG: None  Radiology: US  Venous Img Lower  Left (DVT Study) Result Date: 08/06/2024 CLINICAL DATA:  LLE pain EXAM: LEFT LOWER EXTREMITY VENOUS DOPPLER ULTRASOUND TECHNIQUE: Gray-scale sonography with graded compression, as well as color Doppler and duplex ultrasound were performed to evaluate the lower extremity deep venous systems from the level of the common femoral vein and including the common femoral, femoral, profunda femoral, popliteal and calf veins including the posterior tibial, peroneal and gastrocnemius veins when visible. The superficial great saphenous vein was also interrogated. Spectral Doppler was utilized to evaluate flow at rest and with distal augmentation maneuvers in the common femoral, femoral and popliteal veins. COMPARISON:  May 09, 2022 FINDINGS: Contralateral Common Femoral Vein: Respiratory phasicity is normal and symmetric with the symptomatic side. No evidence of thrombus. Normal compressibility. Common Femoral Vein: No evidence of thrombus.  Normal compressibility, respiratory phasicity and response to augmentation. Saphenofemoral Junction: No evidence of thrombus. Normal compressibility and flow on color Doppler imaging. Profunda Femoral Vein: No evidence of thrombus. Normal compressibility and flow on color Doppler imaging. Femoral Vein: No evidence of thrombus. Normal compressibility, respiratory phasicity and response to augmentation. Popliteal Vein: No evidence of thrombus. Normal compressibility, respiratory phasicity and response to augmentation. Calf Veins: No evidence of thrombus. Normal compressibility and flow on color Doppler imaging. Superficial Great Saphenous Vein: No evidence of thrombus. Normal compressibility. Other Findings:  None. IMPRESSION: Negative for deep venous  thrombosis in the left leg. Electronically Signed   By: Rogelia Myers M.D.   On: 08/06/2024 18:59   CT ABDOMEN PELVIS WO CONTRAST Result Date: 08/06/2024 CLINICAL DATA:  Left lower quadrant abdominal pain EXAM: CT ABDOMEN AND PELVIS WITHOUT CONTRAST TECHNIQUE: Multidetector CT imaging of the abdomen and pelvis was performed following the standard protocol without IV contrast. RADIATION DOSE REDUCTION: This exam was performed according to the departmental dose-optimization program which includes automated exposure control, adjustment of the mA and/or kV according to patient size and/or use of iterative reconstruction technique. COMPARISON:  05/09/2022 FINDINGS: Lower chest: Trace right pleural effusion.  Stable emphysema. Hepatobiliary: Unremarkable appearance of the liver. High density material dependent within the gallbladder consistent with small stones or sludge. No evidence of acute cholecystitis. No biliary duct dilation. Pancreas: Unremarkable unenhanced appearance. Spleen: Unremarkable unenhanced appearance. Adrenals/Urinary Tract: Stable cortical calcification lower pole right kidney. No evidence of nephrolithiasis or obstructive uropathy within either  kidney. Bladder is decompressed, limiting its evaluation. The adrenals are unremarkable. Stomach/Bowel: No bowel obstruction or ileus. Normal appendix right lower quadrant. Scattered diverticulosis of the sigmoid colon without diverticulitis. Small hiatal hernia. Vascular/Lymphatic: Aortic atherosclerosis. No enlarged abdominal or pelvic lymph nodes. Reproductive: Uterus is atrophic. Interval development of a complex cystic and solid right adnexal mass measuring 5.3 x 5.7 x 5.2 cm, reference image 66/2. Pelvic ultrasound recommended for further evaluation, as the appearance is highly suggestive of ovarian malignancy. The left ovary is unremarkable. Other: Moderate ascites, most pronounced in the right upper quadrant. No free intraperitoneal gas. No abdominal wall hernia. Musculoskeletal: No acute or destructive bony abnormalities. Stable remote L1 compression fracture. Prior healed left parasymphyseal fracture. Reconstructed images demonstrate no additional findings. IMPRESSION: 1. 5.7 cm complex cystic and solid right adnexal mass, highly concerning for ovarian neoplasm. Follow-up pelvic ultrasound is recommended. 2. Moderate ascites. Malignant ascites cannot be excluded given the right adnexal mass described above. Diagnostic paracentesis could be considered for cytologic analysis. 3. Trace right pleural effusion. 4. Gallbladder sludge versus small layering gallstones. No evidence of acute cholecystitis. 5.  Aortic Atherosclerosis (ICD10-I70.0). Electronically Signed   By: Ozell Daring M.D.   On: 08/06/2024 17:22     Procedures   Medications Ordered in the ED - No data to display  ED Course  Patient seen and examined. History obtained directly from patient and family member at bedside.  Labs/EKG: Ordered CBC, CMP, UA, respiratory panel.  Imaging: None ordered  Medications/Fluids: None ordered  Most recent vital signs reviewed and are as follows: BP (!) 115/50   Pulse 86   Temp (!) 97.4 F  (36.3 C) (Oral)   Resp 14   SpO2 100%   Initial impression: Nonspecific symptoms involving nasal congestion and lower abdominal pain which has improved, nonspecific chills and feeling hot overnight without documented fever, chronic neuropathy.    4:52 PM Reassessment performed. Patient appears stable. She is complaining of a great deal of pain in the left groin area.  On reexamination, pain is poorly localized, no redness or swelling.  No bulging or abnormal pulse.  Labs personally reviewed and interpreted including: CBC with normal white blood cell count, hemoglobin elevated at 15.6; CMP elevated creatinine at 1.78 with BUN of 36, glucose 178 with normal anion gap; UA with 11-20 white blood cells, clean-catch.  Will consider cystitis given lack of other etiology.  Reviewed pertinent lab work and imaging with patient at bedside. Questions answered.   Most current vital signs reviewed and are as follows:  BP (!) 114/52 (BP Location: Right Arm)   Pulse 75   Temp 97.9 F (36.6 C) (Oral)   Resp 18   SpO2 97%   Plan: Patient's son-in-law at bedside.  Family again mentions that current symptoms are not typical for the patient and she is not one to complain.  They are concerned that something is definitely wrong.  Will plan to further evaluate with CT abdomen pelvis without contrast given left lower quadrant and pelvic tenderness and ultrasound of the left lower extremity to rule out possibility of DVT.  7:29 PM Reassessment performed. Patient appears stable, comfortable.  She feels much improved after 25 mcg of fentanyl .  She received a second dose which allowed her to get through the DVT study.  Imaging personally visualized and interpreted including: CT of the abdomen and pelvis shows a nearly 6 cm mass in the right adnexa with moderate ascites, concern for malignancy.  No focal problems in the left lower quadrant to explain the patient's intermittent pain.  DVT study results were reviewed  and were negative.  I reviewed these findings with patient and family members at bedside on 2 different occasions.  We agreed that given her poorly controlled pain, elevated kidney function, poor appetite --she would best be served by admission for pain control, IV hydration, and potentially some further workup in regards to this new mass.  Patient agrees, family agrees.  Most current vital signs reviewed and are as follows: BP (!) 127/55 (BP Location: Right Arm)   Pulse 78   Temp 97.9 F (36.6 C) (Oral)   Resp 18   SpO2 93%   Plan: Admission for initiation of workup on right lower abdominal mass, acute kidney injury, uncontrolled pain and nausea.  Patient has required several doses of IV pain medication for severe unbearable pain.                                  Medical Decision Making Amount and/or Complexity of Data Reviewed Labs: ordered. Radiology: ordered.  Risk Prescription drug management. Decision regarding hospitalization.   Admit for above.    Acute kidney injury likely related to dehydration given elevated BUN.  Patient given IV fluids here.  She also could be third spacing fluid given her ascites noted which could be contributing.  Right ovarian mass, unclear etiology at this point, concern for malignancy.  This will require additional workup.  Will obtain ultrasound here tonight.  UA with some white blood cells, symptoms are clinically not concerning for pyelonephritis, culture sent.  Symptoms have been poorly controlled in the emergency department and patient is high likelihood on need to return to the hospital for uncontrolled symptoms.  She gets severely nauseous with administration of oral medications.     Final diagnoses:  Acute kidney injury (HCC)  Left lower quadrant abdominal pain  Other ascites  Ovarian mass, right  Intractable pain  Nausea and vomiting, unspecified vomiting type    ED Discharge Orders     None          Desiderio Chew,  PA-C 08/06/24 1933    Levander Houston, MD 08/09/24 1215

## 2024-08-06 NOTE — ED Triage Notes (Signed)
 Lower abdominal pain with chills and dark stools that began yesterday. Also reports neuropathy and leg cramps.

## 2024-08-06 NOTE — ED Notes (Signed)
Pt aware urine specimen ordered. Specimen collection device provided to patient. 

## 2024-08-06 NOTE — ED Notes (Signed)
 Pt is asleep at this time and family asked to wait on reglan  until she wakes up.

## 2024-08-07 ENCOUNTER — Encounter (HOSPITAL_COMMUNITY): Payer: Self-pay | Admitting: Family Medicine

## 2024-08-07 DIAGNOSIS — N838 Other noninflammatory disorders of ovary, fallopian tube and broad ligament: Secondary | ICD-10-CM | POA: Diagnosis present

## 2024-08-07 DIAGNOSIS — Z743 Need for continuous supervision: Secondary | ICD-10-CM | POA: Diagnosis not present

## 2024-08-07 DIAGNOSIS — I959 Hypotension, unspecified: Secondary | ICD-10-CM | POA: Diagnosis not present

## 2024-08-07 DIAGNOSIS — N9489 Other specified conditions associated with female genital organs and menstrual cycle: Secondary | ICD-10-CM | POA: Diagnosis present

## 2024-08-07 DIAGNOSIS — N179 Acute kidney failure, unspecified: Secondary | ICD-10-CM | POA: Diagnosis present

## 2024-08-07 DIAGNOSIS — R1032 Left lower quadrant pain: Secondary | ICD-10-CM | POA: Diagnosis present

## 2024-08-07 DIAGNOSIS — R188 Other ascites: Secondary | ICD-10-CM | POA: Diagnosis present

## 2024-08-07 DIAGNOSIS — R1084 Generalized abdominal pain: Secondary | ICD-10-CM | POA: Diagnosis not present

## 2024-08-07 DIAGNOSIS — R112 Nausea with vomiting, unspecified: Secondary | ICD-10-CM | POA: Diagnosis present

## 2024-08-07 DIAGNOSIS — D751 Secondary polycythemia: Secondary | ICD-10-CM | POA: Diagnosis present

## 2024-08-07 DIAGNOSIS — R109 Unspecified abdominal pain: Secondary | ICD-10-CM | POA: Diagnosis not present

## 2024-08-07 DIAGNOSIS — R52 Pain, unspecified: Secondary | ICD-10-CM | POA: Diagnosis present

## 2024-08-07 LAB — BASIC METABOLIC PANEL WITH GFR
Anion gap: 12 (ref 5–15)
BUN: 24 mg/dL — ABNORMAL HIGH (ref 8–23)
CO2: 20 mmol/L — ABNORMAL LOW (ref 22–32)
Calcium: 8.2 mg/dL — ABNORMAL LOW (ref 8.9–10.3)
Chloride: 109 mmol/L (ref 98–111)
Creatinine, Ser: 0.85 mg/dL (ref 0.44–1.00)
GFR, Estimated: >60 mL/min (ref >60)
Glucose, Bld: 89 mg/dL (ref 70–99)
Potassium: 4 mmol/L (ref 3.5–5.1)
Sodium: 141 mmol/L (ref 135–145)

## 2024-08-07 MED ORDER — GABAPENTIN 100 MG PO CAPS
100.0000 mg | ORAL_CAPSULE | Freq: Every day | ORAL | Status: DC
  Filled 2024-08-07: qty 1

## 2024-08-07 MED ORDER — OXYCODONE HCL 5 MG PO TABS: 5.0000 mg | ORAL_TABLET | Freq: Four times a day (QID) | ORAL | Status: DC | PRN

## 2024-08-07 MED ORDER — LOPERAMIDE HCL 2 MG PO CAPS
2.0000 mg | ORAL_CAPSULE | Freq: Once | ORAL | Status: AC
  Administered 2024-08-07: 2 mg via ORAL
  Filled 2024-08-07: qty 1

## 2024-08-07 MED ORDER — ACETAMINOPHEN 325 MG PO TABS
650.0000 mg | ORAL_TABLET | Freq: Four times a day (QID) | ORAL | Status: DC | PRN
  Administered 2024-08-07: 650 mg via ORAL
  Filled 2024-08-07: qty 2

## 2024-08-07 MED ORDER — ENOXAPARIN SODIUM 30 MG/0.3ML IJ SOSY: 30.0000 mg | PREFILLED_SYRINGE | INTRAMUSCULAR | Status: DC

## 2024-08-07 MED ORDER — PROCHLORPERAZINE EDISYLATE 10 MG/2ML IJ SOLN: 5.0000 mg | Freq: Four times a day (QID) | INTRAMUSCULAR | Status: DC | PRN

## 2024-08-07 MED ORDER — HYDROMORPHONE HCL 1 MG/ML IJ SOLN: 0.5000 mg | INTRAMUSCULAR | Status: DC | PRN

## 2024-08-07 MED ORDER — ENOXAPARIN SODIUM 40 MG/0.4ML IJ SOSY: 40.0000 mg | PREFILLED_SYRINGE | INTRAMUSCULAR | Status: DC

## 2024-08-07 MED ORDER — POLYETHYLENE GLYCOL 3350 17 G PO PACK: 17.0000 g | PACK | Freq: Every day | ORAL | Status: DC | PRN

## 2024-08-07 MED ORDER — ORAL CARE MOUTH RINSE: 15.0000 mL | OROMUCOSAL | Status: DC | PRN

## 2024-08-07 MED ORDER — ENOXAPARIN SODIUM 40 MG/0.4ML IJ SOSY
40.0000 mg | PREFILLED_SYRINGE | INTRAMUSCULAR | Status: DC
  Administered 2024-08-07: 40 mg via SUBCUTANEOUS
  Filled 2024-08-07: qty 0.4

## 2024-08-07 NOTE — Plan of Care (Signed)

## 2024-08-07 NOTE — H&P (Signed)
 History and Physical    Patient: Nina Ramos FMW:[id number] DOB: 12/15/1945 DOA: 08/06/2024 DOS: the patient was seen and examined on 08/07/2024 PCP: Duanne Butler [name], MD  Patient coming from: Home  Chief Complaint:  Chief Complaint  Patient presents with   Abdominal Pain   HPI: Nina Ramos is a 78 y.o. female with medical history significant of agoraphobia, generalized anxiety disorder, chronic venous insufficiency, hypertension, hyperlipidemia, lichenoid dermatitis, unspecified neuropathy, osteoporosis who presented to the emergency department complaints of abdominal pain, chills and melena.  Per patient, she ate a lot of cheese which made her constipated.  She took some prune juice to help relieve the constipation and developed lower abdominal pain with mild diarrhea.  No nausea, emesis, melena or hematochezia.  She denied fever, chills, rhinorrhea, sore throat, wheezing or hemoptysis.  No chest pain, palpitations, diaphoresis, PND, orthopnea or pitting edema of the lower extremities.  No flank pain, dysuria, frequency or hematuria.  No polyuria, polydipsia, polyphagia or blurred vision.   Lab work: Urinalysis showed trace ketones and trace leukocyte esterase with protein of 30 mg/dL.  Coronavirus, influenza and RSV PCR test was negative.  CBC showed a white count of 6.3, hemoglobin 15.6 g/dL platelets 737.  CMP showed CO2 of 21 mmol/L with a normal anion gap, glucose 178, BUN 36 and creatinine 1.78 mg/dL.  The rest of the electrolytes and hepatic functions were normal.  Her creatinine level in 09/24 was 1.04 mg/dL.  Imaging: CT abdomen/pelvis without contrast showing a 5.7 cm complex cystic and solid right adnexal mass, highly concerning for ovarian neoplasm.  Follow-up pelvic ultrasound recommended.  Moderate ascites.  Malignant ascites cannot be excluded given the right adnexal mass.  Diagnostic paracentesis could be considered.  Trace right pleural effusion.  Gallbladder sludge versus  small layering gallstones without evidence of acute cholecystitis.  Aortic atherosclerosis.  Left lower extremity Doppler was negative for DVT.  Pelvic ultrasound suspected right adnexal mass on CT is not sonographically visualized.  This remains suspicious for ovarian neoplasm when correlating with study.  Large volume pelvic ascites.   ED course: Initial vital signs were temperature 97.4 F, pulse 86, respiration 14, BP 115/50 mmHg and O2 sat 100% on room air.  The patient received fentanyl  25 mcg IVP x 2, morphine  4 mg IVP x 1, metoclopramide  10 mg IVP x 1, ondansetron  4 mg IVP x 2 and 2000 mL of normal saline bolus.  Review of Systems: As mentioned in the history of present illness. All other systems reviewed and are negative. Past Medical History:  Diagnosis Date   Chronic venous insufficiency    Hyperlipidemia    Hypertension    Osteoporosis    History reviewed. No pertinent surgical history. Social History:  reports that she has quit smoking. She has never been exposed to tobacco smoke. She quit smokeless tobacco use about 30 years ago. She reports that she does not drink alcohol and does not use drugs.  No Known Allergies  Family History  Problem Relation Age of Onset   Asthma Mother    Heart disease Father    Breast cancer Paternal Aunt     Prior to Admission medications   Medication Sig Start Date End Date Taking? Authorizing Provider  alendronate  (FOSAMAX ) 70 MG tablet Take with a full glass of water on an empty stomach. 08/22/21   Duanne Butler [name], MD  cyclobenzaprine  (FLEXERIL ) 5 MG tablet Take 1 tablet (5 mg total) by mouth 2 (two) times daily as needed  for muscle spasms. Patient not taking: Reported on 08/04/2023 04/13/22   Freddi Hamilton, MD  fluocinolone  (VANOS) 0.01 % cream APPLY ON THE SKIN TWICE DAILY 03/09/19   [place], [place] F, MD  gabapentin  (NEURONTIN ) 100 MG capsule TAKE 1 CAPSULE BY MOUTH AT  BEDTIME 09/12/23   Duanne Butler DASEN, MD  ondansetron  (ZOFRAN -ODT) 4  MG disintegrating tablet Take 1 tablet (4 mg total) by mouth every 8 (eight) hours as needed for nausea or vomiting. 05/09/22   Roselyn Carlin NOVAK, MD    Physical Exam: Vitals:   08/06/24 2330 08/07/24 0140 08/07/24 0145 08/07/24 0542  BP: (!) 130/58 (!) 114/55  (!) 121/50  Pulse: 82 80  83  Resp: 16 16  14   Temp: 97.9 F (36.6 C) 97.7 F (36.5 C)  97.8 F (36.6 C)  TempSrc: Oral Oral  Oral  SpO2: 97% 98%  99%  Weight:   57.5 kg   Height:   5' 4 (1.626 m)    Physical Exam Vitals and nursing note reviewed.  Constitutional:      General: She is awake. She is not in acute distress.    Appearance: She is well-developed. She is ill-appearing.  HENT:     Head: Normocephalic.     Nose: No rhinorrhea.     Mouth/Throat:     Mouth: Mucous membranes are dry.  Eyes:     General: No scleral icterus.    Pupils: Pupils are equal, round, and reactive to light.  Neck:     Vascular: No JVD.  Cardiovascular:     Rate and Rhythm: Normal rate and regular rhythm.     Heart sounds: S1 normal and S2 normal.  Pulmonary:     Effort: Pulmonary effort is normal.     Breath sounds: Normal breath sounds. No wheezing, rhonchi or rales.  Abdominal:     General: Bowel sounds are normal.     Palpations: Abdomen is soft.     Tenderness: There is abdominal tenderness.  Musculoskeletal:     Cervical back: Neck supple.     Right lower leg: No edema.     Left lower leg: No edema.  Skin:    General: Skin is warm and dry.  Neurological:     General: No focal deficit present.     Mental Status: She is alert and oriented to person, place, and time.  Psychiatric:        Mood and Affect: Mood normal.        Behavior: Behavior normal. Behavior is cooperative.     Data Reviewed:  Results are pending, will review when available.  Assessment and Plan: Principal Problem:   Intractable abdominal pain In the setting of her right   Adnexal mass Observation/MedSurg. Continue IV fluids. Diet advanced  as tolerated. Analgesics as needed. Antiemetics as needed. Pantoprazole 40 mg IVP daily. Follow CBC, CMP and lipase in AM.  Active Problems:   AKI (acute kidney injury) (HCC) Continue IV fluids. Avoid hypotension. Avoid nephrotoxins. Monitor intake and output. Monitor renal function electrolytes.    GAD (generalized anxiety disorder) Anxiolytics as needed. Follow-up with [facility] and PCP.    HTN (hypertension) Antihypertensives as needed.    Neuropathy Resume Neurontin  100 mg p.o. daily at bedtime.    Hyperlipidemia Follow-up with PCP.    Erythrocytosis Hemoconcentration? Follow-up hemoglobin in AM.    Advance Care Planning:   Code Status: Full Code   Consults:   Family Communication:   Severity of Illness: The appropriate patient  status for this patient is OBSERVATION. Observation status is judged to be reasonable and necessary in order to provide the required intensity of service to ensure the patient's safety. The patient's presenting symptoms, physical exam findings, and initial radiographic and laboratory data in the context of their medical condition is felt to place them at decreased risk for further clinical deterioration. Furthermore, it is anticipated that the patient will be medically stable for discharge from the hospital within 2 midnights of admission.   Author: Alm Dorn Castor, MD 08/07/2024 8:27 AM  For on call review [email or web address].   This document was prepared using Dragon voice recognition software and may contain some unintended transcription errors.

## 2024-08-07 NOTE — Plan of Care (Signed)
  Problem: Pain Managment: Goal: General experience of comfort will improve and/or be controlled Outcome: Progressing   Problem: Safety: Goal: Ability to remain free from injury will improve Outcome: Progressing

## 2024-08-07 NOTE — Care Management Obs Status (Signed)
 MEDICARE OBSERVATION STATUS NOTIFICATION   Patient Details  Name: Nina Ramos MRN: [id number] Date of Birth: 09-22-1946   Medicare Observation Status Notification Given:  Yes    Jance Siek A Jobani Sabado, LCSW 08/07/2024, 5:13 PM

## 2024-08-08 ENCOUNTER — Observation Stay (HOSPITAL_COMMUNITY)

## 2024-08-08 ENCOUNTER — Encounter (HOSPITAL_COMMUNITY): Payer: Self-pay | Admitting: Family Medicine

## 2024-08-08 ENCOUNTER — Other Ambulatory Visit: Payer: Self-pay | Admitting: Psychiatry

## 2024-08-08 DIAGNOSIS — R1900 Intra-abdominal and pelvic swelling, mass and lump, unspecified site: Secondary | ICD-10-CM

## 2024-08-08 DIAGNOSIS — R634 Abnormal weight loss: Secondary | ICD-10-CM | POA: Diagnosis not present

## 2024-08-08 DIAGNOSIS — R630 Anorexia: Secondary | ICD-10-CM

## 2024-08-08 DIAGNOSIS — R180 Malignant ascites: Secondary | ICD-10-CM

## 2024-08-08 DIAGNOSIS — R188 Other ascites: Secondary | ICD-10-CM | POA: Diagnosis not present

## 2024-08-08 DIAGNOSIS — R109 Unspecified abdominal pain: Secondary | ICD-10-CM | POA: Diagnosis not present

## 2024-08-08 DIAGNOSIS — N9489 Other specified conditions associated with female genital organs and menstrual cycle: Secondary | ICD-10-CM

## 2024-08-08 LAB — COMPREHENSIVE METABOLIC PANEL WITH GFR
ALT: 9 U/L (ref 0–44)
AST: 31 U/L (ref 15–41)
Albumin: 3.3 g/dL — ABNORMAL LOW (ref 3.5–5.0)
Alkaline Phosphatase: 40 U/L (ref 38–126)
Anion gap: 11 (ref 5–15)
BUN: 22 mg/dL (ref 8–23)
CO2: 18 mmol/L — ABNORMAL LOW (ref 22–32)
Calcium: 7.9 mg/dL — ABNORMAL LOW (ref 8.9–10.3)
Chloride: 112 mmol/L — ABNORMAL HIGH (ref 98–111)
Creatinine, Ser: 0.69 mg/dL (ref 0.44–1.00)
GFR, Estimated: >60 mL/min (ref >60)
Glucose, Bld: 79 mg/dL (ref 70–99)
Potassium: 3.9 mmol/L (ref 3.5–5.1)
Sodium: 141 mmol/L (ref 135–145)
Total Bilirubin: 0.5 mg/dL (ref 0.0–1.2)
Total Protein: 5 g/dL — ABNORMAL LOW (ref 6.5–8.1)

## 2024-08-08 LAB — PROTEIN, PLEURAL OR PERITONEAL FLUID: Total protein, fluid: 3.4 g/dL

## 2024-08-08 LAB — CBC
HCT: 36 % (ref 36.0–46.0)
Hemoglobin: 11.1 g/dL — ABNORMAL LOW (ref 12.0–15.0)
MCH: 30.6 pg (ref 26.0–34.0)
MCHC: 30.8 g/dL (ref 30.0–36.0)
MCV: 99.2 fL (ref 80.0–100.0)
Platelets: 176 K/uL (ref 150–400)
RBC: 3.63 MIL/uL — ABNORMAL LOW (ref 3.87–5.11)
RDW: 14.6 % (ref 11.5–15.5)
WBC: 4.3 K/uL (ref 4.0–10.5)
nRBC: 0 % (ref 0.0–0.2)

## 2024-08-08 LAB — BODY FLUID CELL COUNT WITH DIFFERENTIAL
Eos, Fluid: 0 %
Lymphs, Fluid: 6 %
Monocyte-Macrophage-Serous Fluid: 59 % (ref 50–90)
Neutrophil Count, Fluid: 35 % — ABNORMAL HIGH (ref 0–25)
Total Nucleated Cell Count, Fluid: 6762 uL — ABNORMAL HIGH (ref 0–1000)

## 2024-08-08 LAB — LACTATE DEHYDROGENASE, PLEURAL OR PERITONEAL FLUID: LD, Fluid: 999 U/L — ABNORMAL HIGH (ref 3–23)

## 2024-08-08 LAB — GLUCOSE, PLEURAL OR PERITONEAL FLUID: Glucose, Fluid: 94 mg/dL

## 2024-08-08 LAB — URINE CULTURE: Culture: <10000 — AB

## 2024-08-08 MED ORDER — LEVOFLOXACIN 750 MG PO TABS: 750.0000 mg | ORAL_TABLET | Freq: Every day | ORAL | 0 refills | Status: AC

## 2024-08-08 MED ORDER — LIDOCAINE-EPINEPHRINE (PF) 2 %-1:200000 IJ SOLN
INTRAMUSCULAR | Status: AC
  Filled 2024-08-08: qty 20

## 2024-08-08 NOTE — Consult Note (Signed)
 GYNECOLOGIC ONCOLOGY INPATIENT CONSULTATION  Date of Service: 08/06/2024  Requesting Service: Hospitalist Requesting Provider: Ivonne Mustache, MD Consulting Provider: Hoy Masters, MD   HISTORY OF PRESENT ILLNESS: Nina Ramos is a 78 y.o. woman who is seen in consultation at the request of Ivonne Mustache, MD for evaluation of new pelvic mass and ascites on imaging concerning for possible ovarian malignancy.  Patient presented to the emergency room on 08/06/2024 for abdominal pain.  She reports that the pain had only been going on for a day or 2.  She had been experiencing constipation with firm stools for few weeks.  She started taking prune juice and thought this was the cause of her pain as she now started to have bowel movements.  She describes the pain as achy, sharp, and stabbing in her lower abdomen rated as a 10 out of 10.  She also noted some chills and worried that maybe she had COVID.  Her last bowel movement was yesterday today.  Her pain has not improved.  She otherwise denies any vaginal bleeding or spotting.  No change in her voiding.  Denies any significant weight loss although her daughter on the phone reports that the patient has lost weight over the past several years.  Denies any change in her p.o. intake and no early satiety.  Maybe has experienced some bloating today.   PAST MEDICAL HISTORY: Past Medical History:  Diagnosis Date   Chronic venous insufficiency    GAD (generalized anxiety disorder) 01/14/2014   Hyperlipidemia    Lichenoid dermatitis 10/05/2018   Neuropathy 10/19/2018   Osteoporosis     PAST SURGICAL HISTORY: History reviewed. No pertinent surgical history.  OB/GYN HISTORY: OB History  Gravida Para Term Preterm AB Living  2 2 1 1  2   SAB IAB Ectopic Multiple Live Births      2    # Outcome Date GA Lbr Len/2nd Weight Sex Type Anes PTL Lv  2 Term      Vag-Spont   LIV  1 Preterm      Vag-Spont   LIV    Age at menarche: 65 Age at menopause:  57 Hx of HRT: no Last pap: unsure, many years since the last History of abnormal pap smears: no  SCREENING STUDIES:  Last mammogram: 2024 Last colonoscopy: none, fecal study 2024  MEDICATIONS:  Current Facility-Administered Medications:    0.9 %  sodium chloride  infusion, , Intravenous, Continuous, Celinda Alm Lot, MD, Last Rate: 50 mL/hr at 08/08/24 0316, Restarted at 08/08/24 0316   acetaminophen  (TYLENOL ) tablet 650 mg, 650 mg, Oral, Q6H PRN, Shona Terry SAILOR, DO, 650 mg at 08/07/24 2027   diphenhydrAMINE  (BENADRYL ) 25 mg in sodium chloride  0.9 % 50 mL IVPB, 25 mg, Intravenous, Q4H PRN, Silver Fell A, PA   enoxaparin  (LOVENOX ) injection 40 mg, 40 mg, Subcutaneous, Q24H, Lynwood Setter, RPH, 40 mg at 08/07/24 2029   gabapentin  (NEURONTIN ) capsule 100 mg, 100 mg, Oral, QHS, Celinda Alm Lot, MD   HYDROmorphone  (DILAUDID ) injection 0.5 mg, 0.5 mg, Intravenous, Q4H PRN, Shona Terry SAILOR, DO   Oral care mouth rinse, 15 mL, Mouth Rinse, PRN, Opyd, Timothy S, MD   polyethylene glycol (MIRALAX  / GLYCOLAX ) packet 17 g, 17 g, Oral, Daily PRN, Hall, Carole N, DO   prochlorperazine  (COMPAZINE ) injection 5 mg, 5 mg, Intravenous, Q6H PRN, Shona Terry N, DO  ALLERGIES: Allergies  Allergen Reactions   Morphine  Nausea And Vomiting    Severe N/V   Oxycodone  Nausea And Vomiting  Severe N/V    FAMILY HISTORY: Family History  Problem Relation Age of Onset   Asthma Mother    Heart disease Father    Breast cancer Paternal Aunt    Ovarian cancer Neg Hx    Colon cancer Neg Hx    Endometrial cancer Neg Hx     SOCIAL HISTORY: Social History   Socioeconomic History   Marital status: Married    Spouse name: Not on file   Number of children: Not on file   Years of education: Not on file   Highest education level: Not on file  Occupational History   Not on file  Tobacco Use   Smoking status: Former    Passive exposure: Never   Smokeless tobacco: Former    Quit date: 02/05/1994   Vaping Use   Vaping status: Never Used  Substance and Sexual Activity   Alcohol use: Never   Drug use: Never   Sexual activity: Not Currently  Other Topics Concern   Not on file  Social History Narrative   Not on file   Social Drivers of Health   Financial Resource Strain: Low Risk  (05/29/2023)   Overall Financial Resource Strain (CARDIA)    Difficulty of Paying Living Expenses: Not hard at all  Food Insecurity: No Food Insecurity (08/07/2024)   Hunger Vital Sign    Worried About Running Out of Food in the Last Year: Never true    Ran Out of Food in the Last Year: Never true  Transportation Needs: No Transportation Needs (08/07/2024)   PRAPARE - Administrator, Civil Service (Medical): No    Lack of Transportation (Non-Medical): No  Physical Activity: Sufficiently Active (05/29/2023)   Exercise Vital Sign    Days of Exercise per Week: 5 days    Minutes of Exercise per Session: 40 min  Stress: No Stress Concern Present (05/29/2023)   [employer] of Occupational Health - Occupational Stress Questionnaire    Feeling of Stress : Not at all  Social Connections: Moderately Integrated (08/07/2024)   Social Connection and Isolation Panel    Frequency of Communication with Friends and Family: More than three times a week    Frequency of Social Gatherings with Friends and Family: More than three times a week    Attends Religious Services: 1 to 4 times per year    Active Member of [employer] or Organizations: No    Attends Banker Meetings: Never    Marital Status: Married  Catering manager Violence: Not At Risk (08/07/2024)   Humiliation, Afraid, Rape, and Kick questionnaire    Fear of Current or Ex-Partner: No    Emotionally Abused: No    Physically Abused: No    Sexually Abused: No    REVIEW OF SYSTEMS: Complete 10-system review is negative except for the following: none  PHYSICAL EXAM: BP 111/70   Pulse 83   Temp 98 F (36.7 C) (Oral)   Resp 14    Ht 5' 4 (1.626 m)   Wt 126 lb 12.2 oz (57.5 kg)   SpO2 98%   BMI 21.76 kg/m  General: Alert, oriented, no acute distress. HEENT: Normocephalic, atraumatic.  Sclera anicteric. Chest: Clear to auscultation bilaterally. Cardiovascular:Regular rate and rhythm, no murmurs, rubs, or gallops. Abdomen: Decreased bowel sounds.  Soft, mild distention in the lower abdomen, nontender to palpation.  No masses appreciated.   Extremities: Grossly normal range of motion.  Warm, well perfused.  No edema bilaterally. Skin: Skin lesion on  left shin consistent with her lichenoid dermatitis Lymphatics: No cervical, supraclavicular, or inguinal adenopathy. GU: Deferred  LABORATORY AND RADIOLOGIC DATA: Outside medical records were reviewed to synthesize the above history, along with the history and physical obtained during the visit.  Laboratory results and imaging reports were reviewed, with pertinent results below.  I personally reviewed the images.  US  PELVIC COMPLETE WITH TRANSVAGINAL 08/06/2024  Narrative EXAM: US  Pelvis, Complete Transvaginal and Transabdominal without Doppler  TECHNIQUE: Transabdominal and transvaginal pelvic duplex ultrasound using B-mode/gray scaled imaging without Doppler spectral analysis and color flow was obtained.  COMPARISON: None provided  CLINICAL HISTORY: [id number] Ovarian mass, right [id number]. C/o abnormal CT today. Patient is weak, nauseous, states everything hurts. Patient is postmenopausal. Exam done bedside in ED due to patient's condition.  FINDINGS:  UTERUS: Uterus measures 5.4 x 1.7 x 2.9 cm (calculated volume 13.7 ml).  ENDOMETRIAL STRIPE: Endometrium measures 3 mm, with small volume fluid in the endometrial canal.  RIGHT OVARY: Right ovary is not discretely visualized. The suspected right adnexal mass on CT is not sonographically visualized.  LEFT OVARY: Left ovary is not discretely visualized.  FREE FLUID: Large volume of pelvic  ascites.  IMPRESSION: 1. Suspected right adnexal mass on CT is not sonographically visualized. This remains suspicious for ovarian neoplasm when correlating with that study. 2. Large volume pelvic ascites.  Electronically signed by: Pinkie Pebbles MD 08/06/2024 08:37 PM EDT RP Workstation: [id number]   CT ABDOMEN PELVIS WO CONTRAST 08/06/2024  Narrative CLINICAL DATA:  Left lower quadrant abdominal pain  EXAM: CT ABDOMEN AND PELVIS WITHOUT CONTRAST  TECHNIQUE: Multidetector CT imaging of the abdomen and pelvis was performed following the standard protocol without IV contrast.  RADIATION DOSE REDUCTION: This exam was performed according to the departmental dose-optimization program which includes automated exposure control, adjustment of the mA and/or kV according to patient size and/or use of iterative reconstruction technique.  COMPARISON:  05/09/2022  FINDINGS: Lower chest: Trace right pleural effusion.  Stable emphysema.  Hepatobiliary: Unremarkable appearance of the liver. High density material dependent within the gallbladder consistent with small stones or sludge. No evidence of acute cholecystitis. No biliary duct dilation.  Pancreas: Unremarkable unenhanced appearance.  Spleen: Unremarkable unenhanced appearance.  Adrenals/Urinary Tract: Stable cortical calcification lower pole right kidney. No evidence of nephrolithiasis or obstructive uropathy within either kidney. Bladder is decompressed, limiting its evaluation. The adrenals are unremarkable.  Stomach/Bowel: No bowel obstruction or ileus. Normal appendix right lower quadrant. Scattered diverticulosis of the sigmoid colon without diverticulitis. Small hiatal hernia.  Vascular/Lymphatic: Aortic atherosclerosis. No enlarged abdominal or pelvic lymph nodes.  Reproductive: Uterus is atrophic. Interval development of a complex cystic and solid right adnexal mass measuring 5.3 x 5.7 x 5.2 cm, reference  image 66/2. Pelvic ultrasound recommended for further evaluation, as the appearance is highly suggestive of ovarian malignancy. The left ovary is unremarkable.  Other: Moderate ascites, most pronounced in the right upper quadrant. No free intraperitoneal gas. No abdominal wall hernia.  Musculoskeletal: No acute or destructive bony abnormalities. Stable remote L1 compression fracture. Prior healed left parasymphyseal fracture. Reconstructed images demonstrate no additional findings.  IMPRESSION: 1. 5.7 cm complex cystic and solid right adnexal mass, highly concerning for ovarian neoplasm. Follow-up pelvic ultrasound is recommended. 2. Moderate ascites. Malignant ascites cannot be excluded given the right adnexal mass described above. Diagnostic paracentesis could be considered for cytologic analysis. 3. Trace right pleural effusion. 4. Gallbladder sludge versus small layering gallstones. No evidence of acute cholecystitis. 5.  Aortic Atherosclerosis (ICD10-I70.0).  Electronically Signed By: Ozell Daring M.D. On: 08/06/2024 17:22   CBC    Component Value Date/Time   WBC 4.3 08/08/2024 0728   RBC 3.63 (L) 08/08/2024 0728   HGB 11.1 (L) 08/08/2024 0728   HCT 36.0 08/08/2024 0728   PLT 176 08/08/2024 0728   MCV 99.2 08/08/2024 0728   MCH 30.6 08/08/2024 0728   MCHC 30.8 08/08/2024 0728   RDW 14.6 08/08/2024 0728   LYMPHSABS 0.7 08/06/2024 1203   MONOABS 0.6 08/06/2024 1203   EOSABS 0.0 08/06/2024 1203   BASOSABS 0.0 08/06/2024 1203     CMP     Component Value Date/Time   NA 141 08/08/2024 0728   K 3.9 08/08/2024 0728   CL 112 (H) 08/08/2024 0728   CO2 18 (L) 08/08/2024 0728   GLUCOSE 79 08/08/2024 0728   BUN 22 08/08/2024 0728   CREATININE 0.69 08/08/2024 0728   CREATININE 1.04 (H) 08/04/2023 1444   CALCIUM 7.9 (L) 08/08/2024 0728   PROT 5.0 (L) 08/08/2024 0728   ALBUMIN 3.3 (L) 08/08/2024 0728   AST 31 08/08/2024 0728   ALT 9 08/08/2024 0728   ALKPHOS 40  08/08/2024 0728   BILITOT 0.5 08/08/2024 0728   GFRNONAA >60 08/08/2024 [id number]     ASSESSMENT AND PLAN: Nina Ramos is a 78 y.o. woman with CT findings notable for a 5.7 cm complex solid right adnexal mass and moderate volume ascites, concerning for possible ovarian malignancy.  Reviewed imaging findings with patient.  Reviewed possible etiologies to include a benign, borderline, or malignant process.  Malignant process can include a primary ovarian process or metastatic process from another site.  Reviewed workup to include tumor markers.  She underwent a paracentesis today.  This was sent for cytology.  Will follow-up with these results.  Reviewed in brief the treatment for ovarian cancer if this is the diagnosis.  Reviewed that this is a combination of surgery and chemotherapy, the order of events dependent on the clinical scenario.  Reviewed that we will plan to follow-up patient out patient falling the results as above.  Additionally recommend a CT abdomen/pelvis with p.o. and IV contrast now that her kidney function has improved as this will better discerned her intra-abdominal findings.  Also recommend a CT chest to rule out any metastatic disease.  Patient otherwise has improvement in pain and is clinically stable for discharge.   The above assessment and plan was communicated to the patient's primary team.    Hoy Masters, MD Gynecologic Oncology

## 2024-08-08 NOTE — Procedures (Signed)
 PROCEDURE SUMMARY:  Successful US  guided paracentesis from right lateral abdomen.  Yielded 350 mL of cloudy, amber fluid.  No immediate complications.  Pt tolerated well.   Specimen was sent for labs.  EBL < 5mL  Solmon Selmer Ku PA-C 08/08/2024 10:45 AM

## 2024-08-08 NOTE — Discharge Summary (Signed)
 Physician Discharge Summary  Nina Ramos FMW:[id number] DOB: 02-05-1946 DOA: 08/06/2024  PCP: Nina Butler Nina Ramos  Admit date: 08/06/2024 Discharge date: 08/08/2024  Admitted From: Home Disposition:  Home  Discharge Condition:Stable CODE STATUS:FULL Diet recommendation: Regular  Brief/Interim Summary: Patient is a 78 year old female with history of  osteoporosis, neuropathy who presented with abdominal pain, chills, dark stool, lower abdominal pain.  On presentation, she was hemodynamically stable.  Lab work showed creatinine of 1.78.  CT abdomen/pelvis showed 5.7 cm complex cystic and solid right adrenal mass concerning for ovarian neoplasm, moderate ascites,gallstones without evidence of acute cholecystitis .  Lower extremity Doppler negative for DVT.  She was admitted for further workup.  She underwent diagnostic paracentesis today by IR with removal of 350 mL of cloudy, amber fluid.  We consulted GYN oncology.  Suspicion for ovarian malignancy.  CEA at 125, CEA, CA 19-19 sent.  GYN oncology recommended outpatient follow-up, she will be called for appointment and she will be worked with outpatient CT imaging.  GYN oncology will follow-up on the cytology report from ascitic fluid.  Upon reviewing the ascitic fluid analysis, she might have SBP as well.  Total nucleated cell count of 6762 with 35% neutrophils.  I have requested to add cytology and Gram stain from ascitic fluid.  Patient does not have any signs of infection, no fever or leukocytosis.  Her abdomen pain has already resolved.  Abdomen is nontender.  I have low suspicion for SBP.  But we will go ahead and empirically treat her with levofloxacin  for 7 days.  I have recommended to follow-up with her PCP in a week  Following problems were addressed during the hospitalization:  Intractable abdominal pain: Likely from ovarian malignancy.  Continue pain management, supportive care.  Abdominal pain is resolved.  Abdomen is soft and  nontender   Right adnexal mass/concern for right ovarian tumor/ascites: CT abdomen/pelvis showed  5.7 cm complex cystic and solid right adrenal mass concerning for ovarian neoplasm, moderate ascites.  We consulted GYN oncology.  Status post diagnostic paracentesis to obtain cytology, removal of 350 mL of cloudy, amber fluid Pelvic ultrasound reported did not show  right adnexal mass as seen on on CT . Pending CA125, CEA, CA 19-9.  These lab reports will be followed by GYN oncology.  Discussed with Dr. Eldonna.  R/O  SBP:  Upon reviewing the ascitic fluid analysis, she might have SBP as well.  Total nucleated cell count of 6762 with 35% neutrophils.  I have requested to add cytology and Gram stain from ascitic fluid.  Patient does not have any signs of infection, no fever or leukocytosis.  Her abdomen pain has already resolved.  Abdomen is nontender.  I have low suspicion for SBP.  But we will go ahead and empirically treat her with levofloxacin  for 7 days.  Follow-up with PCP in a week.   AKI: Resolved with IV fluid   History of neuropathy: On Neurontin  at home     Discharge Diagnoses:  Principal Problem:   Intractable abdominal pain Active Problems:   GAD (generalized anxiety disorder)   HTN (hypertension)   Neuropathy   Hyperlipidemia   AKI (acute kidney injury) (HCC)   Erythrocytosis   Adnexal mass    Discharge Instructions  Discharge Instructions     Diet general   Complete by: As directed    Discharge instructions   Complete by: As directed    1)Please take your medications as instructed 2)Follow up with your PCP next  week 3)You will be called by GYN oncology for follow up appointment   Increase activity slowly   Complete by: As directed       Allergies as of 08/08/2024       Reactions   Morphine  Nausea And Vomiting   Severe N/V   Oxycodone  Nausea And Vomiting   Severe N/V        Medication List     TAKE these medications    gabapentin  100 MG  capsule Commonly known as: NEURONTIN  TAKE 1 CAPSULE BY MOUTH AT  BEDTIME   levofloxacin  750 MG tablet Commonly known as: Levaquin  Take 1 tablet (750 mg total) by mouth daily for 7 days.        Follow-up Information     Nina Ramos. Schedule an appointment as soon as possible for a visit in 1 week(s).   Specialty: Family Medicine Contact information: [address] [place] [address] [phone]         Nina Ramos. Schedule an appointment as soon as possible for a visit in 1 week(s).   Specialty: Gynecologic Oncology Contact information: [address] [place] [address] [phone]                Allergies  Allergen Reactions   Morphine  Nausea And Vomiting    Severe N/V   Oxycodone  Nausea And Vomiting    Severe N/V    Consultations: GYN oncology   Procedures/Studies: US  Paracentesis Result Date: 08/08/2024 INDICATION: 78 year old female with new onset right adnexal mass with ascites. Diagnostic paracentesis requested EXAM: ULTRASOUND GUIDED DIAGNOSTIC PARACENTESIS MEDICATIONS: 8 mL 1% lidocaine  COMPLICATIONS: None immediate. PROCEDURE: Informed written consent was obtained from the patient after a discussion of the risks, benefits and alternatives to treatment. A timeout was performed prior to the initiation of the procedure. Initial ultrasound scanning demonstrates a small amount of ascites within the right lower abdominal quadrant. The right lower abdomen was prepped and draped in the usual sterile fashion. 1% lidocaine  was used for local anesthesia. Following this, a 19 gauge, 7-cm, Yueh catheter was introduced. An ultrasound image was saved for documentation purposes. The paracentesis was performed. The catheter was removed and a dressing was applied. The patient tolerated the procedure well without immediate post procedural complication. FINDINGS: A total of approximately 350 mL of cloudy, amber fluid was removed. Samples  were sent to the laboratory as requested by the clinical team. IMPRESSION: Successful ultrasound-guided paracentesis yielding 350 mL of peritoneal fluid. Performed by: Kacie Matthews PA-C Electronically Signed   By: Cordella Banner   On: 08/08/2024 10:58   US  PELVIC COMPLETE WITH TRANSVAGINAL Result Date: 08/06/2024 EXAM: US  Pelvis, Complete Transvaginal and Transabdominal without Doppler TECHNIQUE: Transabdominal and transvaginal pelvic duplex ultrasound using B-mode/gray scaled imaging without Doppler spectral analysis and color flow was obtained. COMPARISON: None provided CLINICAL HISTORY: [id number] Ovarian mass, right [id number]. C/o abnormal CT today. Patient is weak, nauseous, states everything hurts. Patient is postmenopausal. Exam done bedside in ED due to patient's condition. FINDINGS: UTERUS: Uterus measures 5.4 x 1.7 x 2.9 cm (calculated volume 13.7 ml). ENDOMETRIAL STRIPE: Endometrium measures 3 mm, with small volume fluid in the endometrial canal. RIGHT OVARY: Right ovary is not discretely visualized. The suspected right adnexal mass on CT is not sonographically visualized. LEFT OVARY: Left ovary is not discretely visualized. FREE FLUID: Large volume of pelvic ascites. IMPRESSION: 1. Suspected right adnexal mass on CT is not sonographically visualized. This remains suspicious for  ovarian neoplasm when correlating with that study. 2. Large volume pelvic ascites. Electronically signed by: Pinkie Pebbles Ramos 08/06/2024 08:37 PM EDT RP Workstation: [id number]   US  Venous Img Lower  Left (DVT Study) Result Date: 08/06/2024 CLINICAL DATA:  LLE pain EXAM: LEFT LOWER EXTREMITY VENOUS DOPPLER ULTRASOUND TECHNIQUE: Gray-scale sonography with graded compression, as well as color Doppler and duplex ultrasound were performed to evaluate the lower extremity deep venous systems from the level of the common femoral vein and including the common femoral, femoral, profunda femoral, popliteal and calf veins including  the posterior tibial, peroneal and gastrocnemius veins when visible. The superficial great saphenous vein was also interrogated. Spectral Doppler was utilized to evaluate flow at rest and with distal augmentation maneuvers in the common femoral, femoral and popliteal veins. COMPARISON:  May 09, 2022 FINDINGS: Contralateral Common Femoral Vein: Respiratory phasicity is normal and symmetric with the symptomatic side. No evidence of thrombus. Normal compressibility. Common Femoral Vein: No evidence of thrombus. Normal compressibility, respiratory phasicity and response to augmentation. Saphenofemoral Junction: No evidence of thrombus. Normal compressibility and flow on color Doppler imaging. Profunda Femoral Vein: No evidence of thrombus. Normal compressibility and flow on color Doppler imaging. Femoral Vein: No evidence of thrombus. Normal compressibility, respiratory phasicity and response to augmentation. Popliteal Vein: No evidence of thrombus. Normal compressibility, respiratory phasicity and response to augmentation. Calf Veins: No evidence of thrombus. Normal compressibility and flow on color Doppler imaging. Superficial Great Saphenous Vein: No evidence of thrombus. Normal compressibility. Other Findings:  None. IMPRESSION: Negative for deep venous thrombosis in the left leg. Electronically Signed   By: Rogelia Myers M.D.   On: 08/06/2024 18:59   CT ABDOMEN PELVIS WO CONTRAST Result Date: 08/06/2024 CLINICAL DATA:  Left lower quadrant abdominal pain EXAM: CT ABDOMEN AND PELVIS WITHOUT CONTRAST TECHNIQUE: Multidetector CT imaging of the abdomen and pelvis was performed following the standard protocol without IV contrast. RADIATION DOSE REDUCTION: This exam was performed according to the departmental dose-optimization program which includes automated exposure control, adjustment of the mA and/or kV according to patient size and/or use of iterative reconstruction technique. COMPARISON:  05/09/2022 FINDINGS:  Lower chest: Trace right pleural effusion.  Stable emphysema. Hepatobiliary: Unremarkable appearance of the liver. High density material dependent within the gallbladder consistent with small stones or sludge. No evidence of acute cholecystitis. No biliary duct dilation. Pancreas: Unremarkable unenhanced appearance. Spleen: Unremarkable unenhanced appearance. Adrenals/Urinary Tract: Stable cortical calcification lower pole right kidney. No evidence of nephrolithiasis or obstructive uropathy within either kidney. Bladder is decompressed, limiting its evaluation. The adrenals are unremarkable. Stomach/Bowel: No bowel obstruction or ileus. Normal appendix right lower quadrant. Scattered diverticulosis of the sigmoid colon without diverticulitis. Small hiatal hernia. Vascular/Lymphatic: Aortic atherosclerosis. No enlarged abdominal or pelvic lymph nodes. Reproductive: Uterus is atrophic. Interval development of a complex cystic and solid right adnexal mass measuring 5.3 x 5.7 x 5.2 cm, reference image 66/2. Pelvic ultrasound recommended for further evaluation, as the appearance is highly suggestive of ovarian malignancy. The left ovary is unremarkable. Other: Moderate ascites, most pronounced in the right upper quadrant. No free intraperitoneal gas. No abdominal wall hernia. Musculoskeletal: No acute or destructive bony abnormalities. Stable remote L1 compression fracture. Prior healed left parasymphyseal fracture. Reconstructed images demonstrate no additional findings. IMPRESSION: 1. 5.7 cm complex cystic and solid right adnexal mass, highly concerning for ovarian neoplasm. Follow-up pelvic ultrasound is recommended. 2. Moderate ascites. Malignant ascites cannot be excluded given the right adnexal mass described above. Diagnostic paracentesis could be considered  for cytologic analysis. 3. Trace right pleural effusion. 4. Gallbladder sludge versus small layering gallstones. No evidence of acute cholecystitis. 5.   Aortic Atherosclerosis (ICD10-I70.0). Electronically Signed   By: Ozell Daring M.D.   On: 08/06/2024 17:22      Subjective: Patient seen and examined at bedside today.  Hemodynamically stable.  Comfortable this morning.  Denies any abdominal pain, nausea or vomiting.  Abdomen is soft, nontender, nondistended.  Underwent paracentesis this morning.  Very eager to go home.  Declined to stay another day this mrng. Discharge planning discussed with daughter on phone before discharge.  She has been instructed to follow-up with her PCP in a week.  She will be called for appointment by GYN oncology  Discharge Exam: Vitals:   08/08/24 1018 08/08/24 1359  BP: 111/70 (!) 144/62  Pulse:  77  Resp:  18  Temp:  98.2 F (36.8 C)  SpO2:  100%   Vitals:   08/07/24 2028 08/08/24 0443 08/08/24 1018 08/08/24 1359  BP: (!) 121/50 (!) 104/50 111/70 (!) 144/62  Pulse: 85 83  77  Resp: 14 14  18   Temp: 98.4 F (36.9 C) 98 F (36.7 C)  98.2 F (36.8 C)  TempSrc: Oral Oral  Oral  SpO2: 99% 98%  100%  Weight:      Height:        General: Pt is alert, awake, not in acute distress Cardiovascular: RRR, S1/S2 +, no rubs, no gallops Respiratory: CTA bilaterally, no wheezing, no rhonchi Abdominal: Soft, NT, ND, bowel sounds + Extremities: no edema, no cyanosis    The results of significant diagnostics from this hospitalization (including imaging, microbiology, ancillary and laboratory) are listed below for reference.     Microbiology: Recent Results (from the past 240 hours)  Resp panel by RT-PCR (RSV, Flu A&B, Covid) Anterior Nasal Swab     Status: None   Collection Time: 08/06/24 12:03 PM   Specimen: Anterior Nasal Swab  Result Value Ref Range Status   SARS Coronavirus 2 by RT PCR NEGATIVE NEGATIVE Final    Comment: (NOTE) SARS-CoV-2 target nucleic acids are NOT DETECTED.  The SARS-CoV-2 RNA is generally detectable in upper respiratory specimens during the acute phase of infection. The  lowest concentration of SARS-CoV-2 viral copies this assay can detect is 138 copies/mL. A negative result does not preclude SARS-Cov-2 infection and should not be used as the sole basis for treatment or other patient management decisions. A negative result may occur with  improper specimen collection/handling, submission of specimen other than nasopharyngeal swab, presence of viral mutation(s) within the areas targeted by this assay, and inadequate number of viral copies(<138 copies/mL). A negative result must be combined with clinical observations, patient history, and epidemiological information. The expected result is Negative.  Fact Sheet for Patients:  BloggerCourse.com  Fact Sheet for Healthcare Providers:  SeriousBroker.it  This test is no t yet approved or cleared by the [place]  FDA and  has been authorized for detection and/or diagnosis of SARS-CoV-2 by FDA under an Emergency Use Authorization (EUA). This EUA will remain  in effect (meaning this test can be used) for the duration of the COVID-19 declaration under Section 564(b)(1) of the Act, 21 U.S.C.section 360bbb-3(b)(1), unless the authorization is terminated  or revoked sooner.       Influenza A by PCR NEGATIVE NEGATIVE Final   Influenza B by PCR NEGATIVE NEGATIVE Final    Comment: (NOTE) The Xpert Xpress SARS-CoV-2/FLU/RSV plus assay is intended as an aid  in the diagnosis of influenza from Nasopharyngeal swab specimens and should not be used as a sole basis for treatment. Nasal washings and aspirates are unacceptable for Xpert Xpress SARS-CoV-2/FLU/RSV testing.  Fact Sheet for Patients: BloggerCourse.com  Fact Sheet for Healthcare Providers: SeriousBroker.it  This test is not yet approved or cleared by the [place]  FDA and has been authorized for detection and/or diagnosis of SARS-CoV-2 by FDA under  an Emergency Use Authorization (EUA). This EUA will remain in effect (meaning this test can be used) for the duration of the COVID-19 declaration under Section 564(b)(1) of the Act, 21 U.S.C. section 360bbb-3(b)(1), unless the authorization is terminated or revoked.     Resp Syncytial Virus by PCR NEGATIVE NEGATIVE Final    Comment: (NOTE) Fact Sheet for Patients: BloggerCourse.com  Fact Sheet for Healthcare Providers: SeriousBroker.it  This test is not yet approved or cleared by the [place]  FDA and has been authorized for detection and/or diagnosis of SARS-CoV-2 by FDA under an Emergency Use Authorization (EUA). This EUA will remain in effect (meaning this test can be used) for the duration of the COVID-19 declaration under Section 564(b)(1) of the Act, 21 U.S.C. section 360bbb-3(b)(1), unless the authorization is terminated or revoked.  Performed at [facility], [address] [place], [place] [address]   Urine Culture     Status: Abnormal   Collection Time: 08/06/24  9:20 PM   Specimen: Urine, Clean Catch  Result Value Ref Range Status   Specimen Description   Final    URINE, CLEAN CATCH Performed at [facility], [address], [place], [place] [address]    Special Requests   Final    NONE Performed at [facility], [address], [place], [place] [address]    Culture (A)  Final    <10,000 COLONIES/mL INSIGNIFICANT GROWTH Performed at [facility], [address] [place], [place] [address]    Report Status 08/08/2024 FINAL  Final     Labs: BNP (last 3 results) No results for input(s): BNP in the last 8760 hours. Basic Metabolic Panel: Recent Labs  Lab 08/06/24 1203 08/07/24 1004 08/08/24 0728  NA 138 141 141  K 4.3 4.0 3.9  CL 102 109 112*  CO2 21* 20* 18*  GLUCOSE 178* 89 79  BUN 36* 24* 22  CREATININE 1.78* 0.85 0.69   CALCIUM 9.5 8.2* 7.9*   Liver Function Tests: Recent Labs  Lab 08/06/24 1203 08/08/24 0728  AST 38 31  ALT 16 9  ALKPHOS 59 40  BILITOT 0.9 0.5  PROT 6.9 5.0*  ALBUMIN 4.5 3.3*   No results for input(s): LIPASE, AMYLASE in the last 168 hours. No results for input(s): AMMONIA in the last 168 hours. CBC: Recent Labs  Lab 08/06/24 1203 08/08/24 0728  WBC 6.3 4.3  NEUTROABS 4.9  --   HGB 15.6* 11.1*  HCT 46.3* 36.0  MCV 95.1 99.2  PLT 262 176   Cardiac Enzymes: No results for input(s): CKTOTAL, CKMB, CKMBINDEX, TROPONINI in the last 168 hours. BNP: Invalid input(s): POCBNP CBG: No results for input(s): GLUCAP in the last 168 hours. D-Dimer No results for input(s): DDIMER in the last 72 hours. Hgb A1c No results for input(s): HGBA1C in the last 72 hours. Lipid Profile No results for input(s): CHOL, HDL, LDLCALC, TRIG, CHOLHDL, LDLDIRECT in the last 72 hours. Thyroid  function studies No results for input(s): TSH, T4TOTAL, T3FREE, THYROIDAB in the last 72 hours.  Invalid input(s): FREET3 Anemia work  up No results for input(s): VITAMINB12, FOLATE, FERRITIN, TIBC, IRON, RETICCTPCT in the last 72 hours. Urinalysis    Component Value Date/Time   COLORURINE YELLOW 08/06/2024 1533   APPEARANCEUR HAZY (A) 08/06/2024 1533   LABSPEC 1.032 (H) 08/06/2024 1533   PHURINE 5.0 08/06/2024 1533   GLUCOSEU NEGATIVE 08/06/2024 1533   HGBUR NEGATIVE 08/06/2024 1533   BILIRUBINUR NEGATIVE 08/06/2024 1533   BILIRUBINUR neg 06/22/2012 1245   KETONESUR TRACE (A) 08/06/2024 1533   PROTEINUR 30 (A) 08/06/2024 1533   UROBILINOGEN 1 01/16/2015 1203   NITRITE NEGATIVE 08/06/2024 1533   LEUKOCYTESUR TRACE (A) 08/06/2024 1533   Sepsis Labs Recent Labs  Lab 08/06/24 1203 08/08/24 0728  WBC 6.3 4.3   Microbiology Recent Results (from the past 240 hours)  Resp panel by RT-PCR (RSV, Flu A&B, Covid) Anterior Nasal Swab      Status: None   Collection Time: 08/06/24 12:03 PM   Specimen: Anterior Nasal Swab  Result Value Ref Range Status   SARS Coronavirus 2 by RT PCR NEGATIVE NEGATIVE Final    Comment: (NOTE) SARS-CoV-2 target nucleic acids are NOT DETECTED.  The SARS-CoV-2 RNA is generally detectable in upper respiratory specimens during the acute phase of infection. The lowest concentration of SARS-CoV-2 viral copies this assay can detect is 138 copies/mL. A negative result does not preclude SARS-Cov-2 infection and should not be used as the sole basis for treatment or other patient management decisions. A negative result may occur with  improper specimen collection/handling, submission of specimen other than nasopharyngeal swab, presence of viral mutation(s) within the areas targeted by this assay, and inadequate number of viral copies(<138 copies/mL). A negative result must be combined with clinical observations, patient history, and epidemiological information. The expected result is Negative.  Fact Sheet for Patients:  BloggerCourse.com  Fact Sheet for Healthcare Providers:  SeriousBroker.it  This test is no t yet approved or cleared by the [place]  FDA and  has been authorized for detection and/or diagnosis of SARS-CoV-2 by FDA under an Emergency Use Authorization (EUA). This EUA will remain  in effect (meaning this test can be used) for the duration of the COVID-19 declaration under Section 564(b)(1) of the Act, 21 U.S.C.section 360bbb-3(b)(1), unless the authorization is terminated  or revoked sooner.       Influenza A by PCR NEGATIVE NEGATIVE Final   Influenza B by PCR NEGATIVE NEGATIVE Final    Comment: (NOTE) The Xpert Xpress SARS-CoV-2/FLU/RSV plus assay is intended as an aid in the diagnosis of influenza from Nasopharyngeal swab specimens and should not be used as a sole basis for treatment. Nasal washings and aspirates are  unacceptable for Xpert Xpress SARS-CoV-2/FLU/RSV testing.  Fact Sheet for Patients: BloggerCourse.com  Fact Sheet for Healthcare Providers: SeriousBroker.it  This test is not yet approved or cleared by the [place]  FDA and has been authorized for detection and/or diagnosis of SARS-CoV-2 by FDA under an Emergency Use Authorization (EUA). This EUA will remain in effect (meaning this test can be used) for the duration of the COVID-19 declaration under Section 564(b)(1) of the Act, 21 U.S.C. section 360bbb-3(b)(1), unless the authorization is terminated or revoked.     Resp Syncytial Virus by PCR NEGATIVE NEGATIVE Final    Comment: (NOTE) Fact Sheet for Patients: BloggerCourse.com  Fact Sheet for Healthcare Providers: SeriousBroker.it  This test is not yet approved or cleared by the [place]  FDA and has been authorized for detection and/or diagnosis of SARS-CoV-2 by FDA under an Emergency Use Authorization (  EUA). This EUA will remain in effect (meaning this test can be used) for the duration of the COVID-19 declaration under Section 564(b)(1) of the Act, 21 U.S.C. section 360bbb-3(b)(1), unless the authorization is terminated or revoked.  Performed at [facility], [address] [place], [place] [address]   Urine Culture     Status: Abnormal   Collection Time: 08/06/24  9:20 PM   Specimen: Urine, Clean Catch  Result Value Ref Range Status   Specimen Description   Final    URINE, CLEAN CATCH Performed at [facility], [address] [place], [place] [address]    Special Requests   Final    NONE Performed at [facility], [address] [place], [place] [address]    Culture (A)  Final    <10,000 COLONIES/mL INSIGNIFICANT GROWTH Performed at [facility], [address], [place], [place]  [address]    Report Status 08/08/2024 FINAL  Final    Please note: You were cared for by a hospitalist during your hospital stay. Once you are discharged, your primary care physician will handle any further medical issues. Please note that NO REFILLS for any discharge medications will be authorized once you are discharged, as it is imperative that you return to your primary care physician (or establish a relationship with a primary care physician if you do not have one) for your post hospital discharge needs so that they can reassess your need for medications and monitor your lab values.    Time coordinating discharge: 40 minutes  SIGNED:   Ivonne Mustache, Ramos  [facility] 08/08/2024, 2:53 PM Pager [phone]  If 7PM-7AM, please contact night-coverage [email or web address] Password [email or web address]

## 2024-08-08 NOTE — Progress Notes (Signed)
 PROGRESS NOTE  Nina Ramos  FMW:[id number] DOB: 1946/10/08 DOA: 08/06/2024 PCP: Duanne Butler [name], MD   Brief Narrative: Patient is a 78 year old female with history of GAD, hypertension, hyperlipidemia, osteoporosis who presented with abdominal pain, chills, dark stool, lower abdominal pain.  On presentation, she was hemodynamically stable.  Lab work showed creatinine of 1.78.  CT abdomen/pelvis showed 5.7 cm complex cystic and solid right adrenal mass concerning for ovarian neoplasm, moderate ascites.,  Gallstones without evidence of acute cholecystitis.  Lower extremity Doppler negative for DVT.  Admitted for further workup.  Assessment & Plan:  Principal Problem:   Intractable abdominal pain Active Problems:   GAD (generalized anxiety disorder)   HTN (hypertension)   Neuropathy   Hyperlipidemia   AKI (acute kidney injury) (HCC)   Erythrocytosis   Adnexal mass  Intractable abdominal pain: Likely from ovarian malignancy.  Continue pain management, supportive care.  Abdominal pain is resolved.  Abdomen is soft and nontender  Right adnexal mass/concern for right ovarian tumor/ascites: CT abdomen/pelvis showed  5.7 cm complex cystic and solid right adrenal mass concerning for ovarian neoplasm, moderate ascites.  We consulted GYN oncology.  Status post diagnostic paracentesis to obtain cytology, removal of 350 mL of cloudy, amber fluid Pelvic ultrasound reported did not show  right adnexal mass as seen on on CT . Pending CA125, CEA, CA 19-9  AKI: Resolved with IV fluids  GAD: Continue home regimen  Hypertension: Currently blood pressure stable.  Continue to monitor  History of neuropathy: Neurontin            DVT prophylaxis:enoxaparin  (LOVENOX ) injection 40 mg Start: 08/07/24 2200     Code Status: Full Code  Family Communication: None at the bedside  Patient status:Obs  Patient is from :home  Anticipated discharge un:ynfz  Estimated DC date:1-2  days   Consultants: GYN onc  Procedures:paracentesis  Antimicrobials:  Anti-infectives (From admission, onward)    None       Subjective: Patient seen and examined at bedside today.  Lying on bed.  Appears comfortable.  Denies any abdomen pain, nausea or vomiting.  Waiting for paracentesis.  Objective: Vitals:   08/07/24 1005 08/07/24 1323 08/07/24 2028 08/08/24 0443  BP: (!) 113/45 (!) 115/42 (!) 121/50 (!) 104/50  Pulse: 81 76 85 83  Resp: 16 16 14 14   Temp:  97.9 F (36.6 C) 98.4 F (36.9 C) 98 F (36.7 C)  TempSrc:  Oral Oral Oral  SpO2: 94% 99% 99% 98%  Weight:      Height:        Intake/Output Summary (Last 24 hours) at 08/08/2024 9191 Last data filed at 08/08/2024 0111 Gross per 24 hour  Intake 1134.85 ml  Output --  Net 1134.85 ml   Filed Weights   08/07/24 0145  Weight: 57.5 kg    Examination:  General exam: Overall comfortable, not in distress HEENT: PERRL Respiratory system:  no wheezes or crackles  Cardiovascular system: S1 & S2 heard, RRR.  Gastrointestinal system: Abdomen is nondistended, soft and nontender. Central nervous system: Alert and oriented Extremities: No edema, no clubbing ,no cyanosis Skin: No rashes, no ulcers,no icterus     Data Reviewed: I have personally reviewed following labs and imaging studies  CBC: Recent Labs  Lab 08/06/24 1203 08/08/24 0728  WBC 6.3 4.3  NEUTROABS 4.9  --   HGB 15.6* 11.1*  HCT 46.3* 36.0  MCV 95.1 99.2  PLT 262 176   Basic Metabolic Panel: Recent Labs  Lab 08/06/24 1203  08/07/24 1004  NA 138 141  K 4.3 4.0  CL 102 109  CO2 21* 20*  GLUCOSE 178* 89  BUN 36* 24*  CREATININE 1.78* 0.85  CALCIUM 9.5 8.2*     Recent Results (from the past 240 hours)  Resp panel by RT-PCR (RSV, Flu A&B, Covid) Anterior Nasal Swab     Status: None   Collection Time: 08/06/24 12:03 PM   Specimen: Anterior Nasal Swab  Result Value Ref Range Status   SARS Coronavirus 2 by RT PCR NEGATIVE NEGATIVE  Final    Comment: (NOTE) SARS-CoV-2 target nucleic acids are NOT DETECTED.  The SARS-CoV-2 RNA is generally detectable in upper respiratory specimens during the acute phase of infection. The lowest concentration of SARS-CoV-2 viral copies this assay can detect is 138 copies/mL. A negative result does not preclude SARS-Cov-2 infection and should not be used as the sole basis for treatment or other patient management decisions. A negative result may occur with  improper specimen collection/handling, submission of specimen other than nasopharyngeal swab, presence of viral mutation(s) within the areas targeted by this assay, and inadequate number of viral copies(<138 copies/mL). A negative result must be combined with clinical observations, patient history, and epidemiological information. The expected result is Negative.  Fact Sheet for Patients:  BloggerCourse.com  Fact Sheet for Healthcare Providers:  SeriousBroker.it  This test is no t yet approved or cleared by the [place]  FDA and  has been authorized for detection and/or diagnosis of SARS-CoV-2 by FDA under an Emergency Use Authorization (EUA). This EUA will remain  in effect (meaning this test can be used) for the duration of the COVID-19 declaration under Section 564(b)(1) of the Act, 21 U.S.C.section 360bbb-3(b)(1), unless the authorization is terminated  or revoked sooner.       Influenza A by PCR NEGATIVE NEGATIVE Final   Influenza B by PCR NEGATIVE NEGATIVE Final    Comment: (NOTE) The Xpert Xpress SARS-CoV-2/FLU/RSV plus assay is intended as an aid in the diagnosis of influenza from Nasopharyngeal swab specimens and should not be used as a sole basis for treatment. Nasal washings and aspirates are unacceptable for Xpert Xpress SARS-CoV-2/FLU/RSV testing.  Fact Sheet for Patients: BloggerCourse.com  Fact Sheet for Healthcare  Providers: SeriousBroker.it  This test is not yet approved or cleared by the [place]  FDA and has been authorized for detection and/or diagnosis of SARS-CoV-2 by FDA under an Emergency Use Authorization (EUA). This EUA will remain in effect (meaning this test can be used) for the duration of the COVID-19 declaration under Section 564(b)(1) of the Act, 21 U.S.C. section 360bbb-3(b)(1), unless the authorization is terminated or revoked.     Resp Syncytial Virus by PCR NEGATIVE NEGATIVE Final    Comment: (NOTE) Fact Sheet for Patients: BloggerCourse.com  Fact Sheet for Healthcare Providers: SeriousBroker.it  This test is not yet approved or cleared by the [place]  FDA and has been authorized for detection and/or diagnosis of SARS-CoV-2 by FDA under an Emergency Use Authorization (EUA). This EUA will remain in effect (meaning this test can be used) for the duration of the COVID-19 declaration under Section 564(b)(1) of the Act, 21 U.S.C. section 360bbb-3(b)(1), unless the authorization is terminated or revoked.  Performed at [facility], [address] [place], [place] [address]      Radiology Studies: US  PELVIC COMPLETE WITH TRANSVAGINAL Result Date: 08/06/2024 EXAM: US  Pelvis, Complete Transvaginal and Transabdominal without Doppler TECHNIQUE: Transabdominal and transvaginal pelvic duplex ultrasound using B-mode/gray scaled imaging without Doppler  spectral analysis and color flow was obtained. COMPARISON: None provided CLINICAL HISTORY: [id number] Ovarian mass, right [id number]. C/o abnormal CT today. Patient is weak, nauseous, states everything hurts. Patient is postmenopausal. Exam done bedside in ED due to patient's condition. FINDINGS: UTERUS: Uterus measures 5.4 x 1.7 x 2.9 cm (calculated volume 13.7 ml). ENDOMETRIAL STRIPE: Endometrium measures 3 mm, with small volume fluid in  the endometrial canal. RIGHT OVARY: Right ovary is not discretely visualized. The suspected right adnexal mass on CT is not sonographically visualized. LEFT OVARY: Left ovary is not discretely visualized. FREE FLUID: Large volume of pelvic ascites. IMPRESSION: 1. Suspected right adnexal mass on CT is not sonographically visualized. This remains suspicious for ovarian neoplasm when correlating with that study. 2. Large volume pelvic ascites. Electronically signed by: Pinkie Pebbles MD 08/06/2024 08:37 PM EDT RP Workstation: [id number]   US  Venous Img Lower  Left (DVT Study) Result Date: 08/06/2024 CLINICAL DATA:  LLE pain EXAM: LEFT LOWER EXTREMITY VENOUS DOPPLER ULTRASOUND TECHNIQUE: Gray-scale sonography with graded compression, as well as color Doppler and duplex ultrasound were performed to evaluate the lower extremity deep venous systems from the level of the common femoral vein and including the common femoral, femoral, profunda femoral, popliteal and calf veins including the posterior tibial, peroneal and gastrocnemius veins when visible. The superficial great saphenous vein was also interrogated. Spectral Doppler was utilized to evaluate flow at rest and with distal augmentation maneuvers in the common femoral, femoral and popliteal veins. COMPARISON:  May 09, 2022 FINDINGS: Contralateral Common Femoral Vein: Respiratory phasicity is normal and symmetric with the symptomatic side. No evidence of thrombus. Normal compressibility. Common Femoral Vein: No evidence of thrombus. Normal compressibility, respiratory phasicity and response to augmentation. Saphenofemoral Junction: No evidence of thrombus. Normal compressibility and flow on color Doppler imaging. Profunda Femoral Vein: No evidence of thrombus. Normal compressibility and flow on color Doppler imaging. Femoral Vein: No evidence of thrombus. Normal compressibility, respiratory phasicity and response to augmentation. Popliteal Vein: No evidence of  thrombus. Normal compressibility, respiratory phasicity and response to augmentation. Calf Veins: No evidence of thrombus. Normal compressibility and flow on color Doppler imaging. Superficial Great Saphenous Vein: No evidence of thrombus. Normal compressibility. Other Findings:  None. IMPRESSION: Negative for deep venous thrombosis in the left leg. Electronically Signed   By: Rogelia Myers M.D.   On: 08/06/2024 18:59   CT ABDOMEN PELVIS WO CONTRAST Result Date: 08/06/2024 CLINICAL DATA:  Left lower quadrant abdominal pain EXAM: CT ABDOMEN AND PELVIS WITHOUT CONTRAST TECHNIQUE: Multidetector CT imaging of the abdomen and pelvis was performed following the standard protocol without IV contrast. RADIATION DOSE REDUCTION: This exam was performed according to the departmental dose-optimization program which includes automated exposure control, adjustment of the mA and/or kV according to patient size and/or use of iterative reconstruction technique. COMPARISON:  05/09/2022 FINDINGS: Lower chest: Trace right pleural effusion.  Stable emphysema. Hepatobiliary: Unremarkable appearance of the liver. High density material dependent within the gallbladder consistent with small stones or sludge. No evidence of acute cholecystitis. No biliary duct dilation. Pancreas: Unremarkable unenhanced appearance. Spleen: Unremarkable unenhanced appearance. Adrenals/Urinary Tract: Stable cortical calcification lower pole right kidney. No evidence of nephrolithiasis or obstructive uropathy within either kidney. Bladder is decompressed, limiting its evaluation. The adrenals are unremarkable. Stomach/Bowel: No bowel obstruction or ileus. Normal appendix right lower quadrant. Scattered diverticulosis of the sigmoid colon without diverticulitis. Small hiatal hernia. Vascular/Lymphatic: Aortic atherosclerosis. No enlarged abdominal or pelvic lymph nodes. Reproductive: Uterus is atrophic. Interval development of a complex  cystic and solid  right adnexal mass measuring 5.3 x 5.7 x 5.2 cm, reference image 66/2. Pelvic ultrasound recommended for further evaluation, as the appearance is highly suggestive of ovarian malignancy. The left ovary is unremarkable. Other: Moderate ascites, most pronounced in the right upper quadrant. No free intraperitoneal gas. No abdominal wall hernia. Musculoskeletal: No acute or destructive bony abnormalities. Stable remote L1 compression fracture. Prior healed left parasymphyseal fracture. Reconstructed images demonstrate no additional findings. IMPRESSION: 1. 5.7 cm complex cystic and solid right adnexal mass, highly concerning for ovarian neoplasm. Follow-up pelvic ultrasound is recommended. 2. Moderate ascites. Malignant ascites cannot be excluded given the right adnexal mass described above. Diagnostic paracentesis could be considered for cytologic analysis. 3. Trace right pleural effusion. 4. Gallbladder sludge versus small layering gallstones. No evidence of acute cholecystitis. 5.  Aortic Atherosclerosis (ICD10-I70.0). Electronically Signed   By: Ozell Daring M.D.   On: 08/06/2024 17:22    Scheduled Meds:  enoxaparin  (LOVENOX ) injection  40 mg Subcutaneous Q24H   gabapentin   100 mg Oral QHS   Continuous Infusions:  sodium chloride  50 mL/hr at 08/08/24 0316   diphenhydrAMINE        LOS: 0 days   Ivonne Mustache, MD [facility] P9/21/2025, 8:08 AM

## 2024-08-09 ENCOUNTER — Telehealth: Payer: Self-pay

## 2024-08-09 LAB — CANCER ANTIGEN 19-9: CA 19-9: 20 U/mL (ref 0–35)

## 2024-08-09 LAB — CEA: CEA: 1.2 ng/mL (ref 0.0–4.7)

## 2024-08-09 LAB — CA 125: Cancer Antigen (CA) 125: 127 U/mL — ABNORMAL HIGH (ref 0.0–38.1)

## 2024-08-09 NOTE — Transitions of Care (Post Inpatient/ED Visit) (Signed)
   08/09/2024  Name: Nina Ramos MRN: [id number] DOB: [birth year]  Today's TOC FU Call Status: Today's TOC FU Call Status:: Successful TOC FU Call Completed TOC FU Call Complete Date: 08/09/24 Patient's Name and Date of Birth confirmed.  Transition Care Management Follow-up Telephone Call Date of Discharge: 08/08/24 Discharge Facility: [facility]) Type of Discharge: Inpatient Admission Primary Inpatient Discharge Diagnosis:: Intractable Abdominal Pain How have you been since you were released from the hospital?: Better Any questions or concerns?: (S) Yes Patient Questions/Concerns:: (S) hesitant to take Levaquin  Patient Questions/Concerns Addressed: Notified Provider of Patient Questions/Concerns  Items Reviewed: Did you receive and understand the discharge instructions provided?: Yes Medications obtained,verified, and reconciled?: Yes (Medications Reviewed) Any new allergies since your discharge?: No Dietary orders reviewed?: NA Do you have support at home?: Yes  Medications Reviewed Today: Medications Reviewed Today     Reviewed by Lavelle Charmaine NOVAK, LPN (Licensed Practical Nurse) on 08/09/24 at 1651  Med List Status: <None>   Medication Order Taking? Sig Documenting Provider Last Dose Status Informant  gabapentin  (NEURONTIN ) 100 MG capsule [id number]  TAKE 1 CAPSULE BY MOUTH AT  BEDTIME  Patient not taking: Reported on 08/09/2024   Duanne Butler [name], MD  Active Self, Pharmacy Records  levofloxacin  (LEVAQUIN ) 750 MG tablet [id number] Yes Take 1 tablet (750 mg total) by mouth daily for 7 days. Jillian Buttery, MD  Active             Home Care and Equipment/Supplies: Were Home Health Services Ordered?: NA Any new equipment or medical supplies ordered?: NA  Functional Questionnaire: Do you need assistance with bathing/showering or dressing?: No Do you need assistance with meal preparation?: No Do you need assistance with eating?: No Do you have difficulty  maintaining continence: No Do you need assistance with getting out of bed/getting out of a chair/moving?: No Do you have difficulty managing or taking your medications?: No  Follow up appointments reviewed: PCP Follow-up appointment confirmed?: Yes Date of PCP follow-up appointment?: 08/12/24 Follow-up Provider: Dr. Duanne Specialist [facility] Follow-up appointment confirmed?: No Reason Specialist Follow-Up Not Confirmed: [facility] Calling Clinician Notified Provider Practice of Needed Appointment Do you need transportation to your follow-up appointment?: No Do you understand care options if your condition(s) worsen?: Yes-patient verbalized understanding    SIGNATURE Charmaine Lavelle, LPN [facility] Advisor [place] [facility] You Are. We Are. One [facility] Direct Dial [phone]

## 2024-08-10 ENCOUNTER — Telehealth: Payer: Self-pay

## 2024-08-10 ENCOUNTER — Telehealth: Payer: Self-pay | Admitting: Oncology

## 2024-08-10 LAB — CYTOLOGY - NON PAP

## 2024-08-10 NOTE — Telephone Encounter (Signed)
 Called Nina Ramos and spoke to her daughter Nina Ramos.  Advised that we are waiting on cytology from the paracentesis Inocente had in the hospital and will call to schedule with the appropriate provider based on the results.  Also let her know that we have scheduled a CT scan with contrast on 08/13/24 at [facility]. Gave her instructions to arrive at 3:00 to drink the contrast and the scan will be at 5:00.  Also gave instruction for NPO 4 hours before the scan.  Nina Ramos said she will not be able to bring her mother to the scan on Friday due to work.  She asked if it can be rescheduled to Monday, 9/29.  Advised I will check and call her back.  CT was able to be rescheduled to 9/29 with arrival at 3:00.  Called Nina Ramos back and let her know.

## 2024-08-10 NOTE — Telephone Encounter (Signed)
 Ms.Keithly's daughter, [name] Bar, called the office to get an appointment for her mom. She reports Dr.Newton did a hospital consult over the weekend. Her mom needs a follow up appointment with Dr.Newton.   Aware She will be getting a call from Darice Sanes RN,nurse navigator, who will set up an appointment.   [name] states if she is not available our office can reach out to her sister, [name] Avena.

## 2024-08-12 ENCOUNTER — Telehealth: Payer: Self-pay | Admitting: Oncology

## 2024-08-12 ENCOUNTER — Ambulatory Visit (INDEPENDENT_AMBULATORY_CARE_PROVIDER_SITE_OTHER): Admitting: Family Medicine

## 2024-08-12 ENCOUNTER — Encounter: Payer: Self-pay | Admitting: Family Medicine

## 2024-08-12 VITALS — BP 136/72 | HR 75 | Temp 97.4°F | Ht 64.0 in | Wt 130.0 lb

## 2024-08-12 DIAGNOSIS — R188 Other ascites: Secondary | ICD-10-CM

## 2024-08-12 DIAGNOSIS — Z23 Encounter for immunization: Secondary | ICD-10-CM | POA: Diagnosis not present

## 2024-08-12 DIAGNOSIS — N9489 Other specified conditions associated with female genital organs and menstrual cycle: Secondary | ICD-10-CM | POA: Diagnosis not present

## 2024-08-12 MED ORDER — CEPHALEXIN 500 MG PO CAPS: 500.0000 mg | ORAL_CAPSULE | Freq: Three times a day (TID) | ORAL | 0 refills | Status: DC

## 2024-08-12 NOTE — Telephone Encounter (Signed)
 Called Nina Ramos and gave her an update.  Advised her that the cytology came back as benign and we are working on getting her an appointment with Dr. Eldonna to arrange for a biopsy.  Advised we will call back on Monday to let her know about the appointment with Dr. Eldonna.

## 2024-08-12 NOTE — Progress Notes (Signed)
 Subjective:    Patient ID: Nina Ramos, female    DOB: 04-May-1946, 78 y.o.   MRN: [id number]  HPI  Admit date: 08/06/2024 Discharge date: 08/08/2024   Admitted From: Home Disposition:  Home   Discharge Condition:Stable CODE STATUS:FULL Diet recommendation: Regular   Brief/Interim Summary: Patient is a 78 year old female with history of  osteoporosis, neuropathy who presented with abdominal pain, chills, dark stool, lower abdominal pain.  On presentation, she was hemodynamically stable.  Lab work showed creatinine of 1.78.  CT abdomen/pelvis showed 5.7 cm complex cystic and solid right adrenal mass concerning for ovarian neoplasm, moderate ascites,gallstones without evidence of acute cholecystitis .  Lower extremity Doppler negative for DVT.  She was admitted for further workup.  She underwent diagnostic paracentesis today by IR with removal of 350 mL of cloudy, amber fluid.  We consulted GYN oncology.  Suspicion for ovarian malignancy.  CEA at 125, CEA, CA 19-19 sent.  GYN oncology recommended outpatient follow-up, she will be called for appointment and she will be worked with outpatient CT imaging.  GYN oncology will follow-up on the cytology report from ascitic fluid.  Upon reviewing the ascitic fluid analysis, she might have SBP as well.  Total nucleated cell count of 6762 with 35% neutrophils.  I have requested to add cytology and Gram stain from ascitic fluid.  Patient does not have any signs of infection, no fever or leukocytosis.  Her abdomen pain has already resolved.  Abdomen is nontender.  I have low suspicion for SBP.  But we will go ahead and empirically treat her with levofloxacin  for 7 days.  I have recommended to follow-up with her PCP in a week   Following problems were addressed during the hospitalization:   Intractable abdominal pain: Likely from ovarian malignancy.  Continue pain management, supportive care.  Abdominal pain is resolved.  Abdomen is soft and nontender    Right adnexal mass/concern for right ovarian tumor/ascites: CT abdomen/pelvis showed  5.7 cm complex cystic and solid right adrenal mass concerning for ovarian neoplasm, moderate ascites.  We consulted GYN oncology.  Status post diagnostic paracentesis to obtain cytology, removal of 350 mL of cloudy, amber fluid Pelvic ultrasound reported did not show  right adnexal mass as seen on on CT . Pending CA125, CEA, CA 19-9.  These lab reports will be followed by GYN oncology.  Discussed with Dr. Eldonna.   R/O  SBP:  Upon reviewing the ascitic fluid analysis, she might have SBP as well.  Total nucleated cell count of 6762 with 35% neutrophils.  I have requested to add cytology and Gram stain from ascitic fluid.  Patient does not have any signs of infection, no fever or leukocytosis.  Her abdomen pain has already resolved.  Abdomen is nontender.  I have low suspicion for SBP.  But we will go ahead and empirically treat her with levofloxacin  for 7 days.  Follow-up with PCP in a week.   AKI: Resolved with IV fluid   History of neuropathy: On Neurontin  at home     08/12/24 Peritoneal fluid analysis revealed reactive changes including an elevated LDH, reactive mesothelial cells, but there were no malignant cells seen.  However CA125 is significantly elevated leading me to believe that the fluid was reactive to the right adnexal mass.  Patient is pain-free.  However she is not having any bowel movements.  She reports poor appetite.  She denies any nausea or vomiting.  She denies any fevers or chills.  She denies  any dysuria.  She did not take the Levaquin .  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 Current Outpatient Medications on File Prior to Visit   Medication Sig Dispense Refill   gabapentin  (NEURONTIN ) 100 MG capsule TAKE 1 CAPSULE BY MOUTH AT  BEDTIME (Patient not taking: Reported on 08/09/2024) 90 capsule 3   levofloxacin  (LEVAQUIN ) 750 MG tablet Take 1 tablet (750 mg total) by mouth daily for 7 days. 7 tablet 0   No current facility-administered medications on file prior to visit.   Social History   Socioeconomic History   Marital status: Married    Spouse name: Not on file   Number of children: Not on file   Years of education: Not on file   Highest education level: Not on file  Occupational History   Not on file  Tobacco Use   Smoking status: Former    Passive exposure: Never   Smokeless tobacco: Former    Quit date: 02/05/1994  Vaping Use   Vaping status: Never Used  Substance and Sexual Activity   Alcohol use: Never   Drug use: Never   Sexual activity: Not Currently  Other Topics Concern   Not on file  Social History Narrative   Not on file   Social Drivers of Health   Financial Resource Strain: Low Risk  (05/29/2023)   Overall Financial Resource Strain (CARDIA)    Difficulty of Paying Living Expenses: Not hard at all  Food Insecurity: No Food Insecurity (08/07/2024)   Hunger Vital Sign    Worried About Running Out of Food in the Last Year: Never true    Ran Out of Food in the Last Year: Never true  Transportation Needs: No Transportation Needs (08/07/2024)   PRAPARE - Administrator, Civil Service (Medical): No    Lack of Transportation (Non-Medical): No  Physical Activity: Sufficiently Active (05/29/2023)   Exercise Vital Sign    Days of Exercise per Week: 5 days    Minutes of Exercise per Session: 40 min  Stress: No Stress Concern Present (05/29/2023)   [employer] of Occupational Health - Occupational Stress Questionnaire    Feeling of Stress : Not at all  Social Connections: Moderately Integrated (08/07/2024)   Social Connection and Isolation Panel    Frequency of Communication with  Friends and Family: More than three times a week    Frequency of Social Gatherings with Friends and Family: More than three times a week    Attends Religious Services: 1 to 4 times per year    Active Member of [employer] or Organizations: No    Attends Banker Meetings: Never    Marital Status: Married  Catering manager Violence: Not At Risk (08/07/2024)   Humiliation, Afraid, Rape, and Kick questionnaire    Fear of Current or Ex-Partner: No    Emotionally Abused: No    Physically Abused: No    Sexually Abused: No   Family History  Problem Relation Age of Onset   Asthma Mother    Heart disease Father    Breast cancer Paternal Aunt    Ovarian cancer Neg Hx    Colon cancer Neg Hx    Endometrial cancer Neg Hx      Review  of Systems  All other systems reviewed and are negative.      Objective:   Physical Exam Constitutional:      General: She is not in acute distress.    Appearance: Normal appearance. She is normal weight. She is not ill-appearing or toxic-appearing.  Cardiovascular:     Rate and Rhythm: Normal rate and regular rhythm.     Heart sounds: Normal heart sounds. No murmur heard.    No friction rub. No gallop.  Pulmonary:     Effort: Pulmonary effort is normal. No respiratory distress.     Breath sounds: Normal breath sounds. No stridor. No wheezing or rales.  Abdominal:     General: Bowel sounds are normal. There is no distension.     Palpations: Abdomen is soft. There is no mass.     Tenderness: There is no abdominal tenderness. There is no guarding.  Musculoskeletal:     Right lower leg: No edema.     Left lower leg: No edema.  Neurological:     Mental Status: She is alert.         Assessment & Plan:   Adnexal mass  Other ascites I had a long discussion today with the patient and her daughters.  CAT scan has been scheduled to evaluate for any spread to the chest etc.  I explained to them that I feel the fluid was reactive to the right  adnexal mass and most likely this does represent right ovarian cancer.  She has an appointment to see the Gyn/Onc after the CT scans have been performed.  At the present time I encouraged her to try to drink plenty of fluid to avoid dehydration and start taking protein supplement.  Ultimately treatment decision will be determined by oncology.  Defer to their expert opinion

## 2024-08-12 NOTE — Addendum Note (Signed)
 Addended by: ANGELENA RONAL SLATER MARLA on: 08/12/2024 04:44 PM   Modules accepted: Orders

## 2024-08-13 ENCOUNTER — Ambulatory Visit (HOSPITAL_COMMUNITY)

## 2024-08-16 ENCOUNTER — Ambulatory Visit (HOSPITAL_COMMUNITY)
Admission: RE | Admit: 2024-08-16 | Discharge: 2024-08-16 | Disposition: A | Discharge status: Home / Self Care | Source: Ambulatory Visit | Attending: Psychiatry | Admitting: Psychiatry

## 2024-08-16 DIAGNOSIS — R180 Malignant ascites: Secondary | ICD-10-CM | POA: Insufficient documentation

## 2024-08-16 DIAGNOSIS — N9489 Other specified conditions associated with female genital organs and menstrual cycle: Secondary | ICD-10-CM | POA: Insufficient documentation

## 2024-08-16 DIAGNOSIS — I2510 Atherosclerotic heart disease of native coronary artery without angina pectoris: Secondary | ICD-10-CM | POA: Insufficient documentation

## 2024-08-16 DIAGNOSIS — C561 Malignant neoplasm of right ovary: Secondary | ICD-10-CM | POA: Diagnosis not present

## 2024-08-16 DIAGNOSIS — J439 Emphysema, unspecified: Secondary | ICD-10-CM | POA: Insufficient documentation

## 2024-08-16 DIAGNOSIS — J90 Pleural effusion, not elsewhere classified: Secondary | ICD-10-CM | POA: Insufficient documentation

## 2024-08-16 DIAGNOSIS — R918 Other nonspecific abnormal finding of lung field: Secondary | ICD-10-CM | POA: Diagnosis not present

## 2024-08-16 DIAGNOSIS — R188 Other ascites: Secondary | ICD-10-CM | POA: Diagnosis not present

## 2024-08-16 DIAGNOSIS — I700 Atherosclerosis of aorta: Secondary | ICD-10-CM | POA: Insufficient documentation

## 2024-08-16 MED ORDER — IOHEXOL 9 MG/ML PO SOLN
500.0000 mL | ORAL | Status: AC
  Administered 2024-08-16 (×2): 500 mL via ORAL

## 2024-08-16 MED ORDER — IOHEXOL 300 MG/ML  SOLN
100.0000 mL | Freq: Once | INTRAMUSCULAR | Status: AC | PRN
  Administered 2024-08-16: 100 mL via INTRAVENOUS

## 2024-08-17 ENCOUNTER — Encounter: Payer: Self-pay | Admitting: Family Medicine

## 2024-08-17 ENCOUNTER — Telehealth: Payer: Self-pay | Admitting: Oncology

## 2024-08-17 ENCOUNTER — Ambulatory Visit: Payer: Self-pay | Admitting: Psychiatry

## 2024-08-17 NOTE — Telephone Encounter (Signed)
 Called Nina Ramos and advised her of appointment for [place] to see Dr. Eldonna on 08/30/24 at 9:45.  She verbalized agreement and asked about the CT results.  Advised I will check with the provider and will call her back to go over the results.

## 2024-08-17 NOTE — Telephone Encounter (Signed)
 Called Nina Ramos back and reviewed the CT results with her.

## 2024-08-20 ENCOUNTER — Ambulatory Visit

## 2024-08-30 ENCOUNTER — Inpatient Hospital Stay: Admitting: Gynecologic Oncology

## 2024-08-30 ENCOUNTER — Encounter: Payer: Self-pay | Admitting: Psychiatry

## 2024-08-30 ENCOUNTER — Inpatient Hospital Stay: Attending: Psychiatry | Admitting: Psychiatry

## 2024-08-30 VITALS — BP 138/84 | HR 72 | Temp 97.9°F | Resp 18 | Ht 64.0 in | Wt 130.0 lb

## 2024-08-30 DIAGNOSIS — Z7189 Other specified counseling: Secondary | ICD-10-CM

## 2024-08-30 DIAGNOSIS — N9489 Other specified conditions associated with female genital organs and menstrual cycle: Secondary | ICD-10-CM

## 2024-08-30 DIAGNOSIS — R971 Elevated cancer antigen 125 [CA 125]: Secondary | ICD-10-CM | POA: Insufficient documentation

## 2024-08-30 DIAGNOSIS — R188 Other ascites: Secondary | ICD-10-CM | POA: Insufficient documentation

## 2024-08-30 DIAGNOSIS — D3911 Neoplasm of uncertain behavior of right ovary: Secondary | ICD-10-CM | POA: Diagnosis not present

## 2024-08-30 DIAGNOSIS — R180 Malignant ascites: Secondary | ICD-10-CM

## 2024-08-30 MED ORDER — TRAMADOL HCL 50 MG PO TABS: 50.0000 mg | ORAL_TABLET | Freq: Four times a day (QID) | ORAL | 0 refills | Status: DC | PRN

## 2024-08-30 MED ORDER — SENNOSIDES-DOCUSATE SODIUM 8.6-50 MG PO TABS: 2.0000 | ORAL_TABLET | Freq: Every day | ORAL | 0 refills | Status: DC

## 2024-08-30 NOTE — Patient Instructions (Addendum)
 Preparing for your Surgery  Plan for surgery on September 14, 2024 with Dr. Hoy Masters at [facility]. You will be scheduled for diagnostic laparoscopy, robotic assisted total laparoscopic hysterectomy (removal of the uterus and cervix), bilateral salpingo-oophorectomy (removal of both ovaries and fallopian tubes), possible staging if a precancer or cancer identified, possible laparotomy (larger incision on the abdomen if needed).  Pre-operative Testing -You will receive a phone call from presurgical testing at [facility] to arrange for a pre-operative appointment and lab work.  -Bring your insurance card, copy of an advanced directive if applicable, medication list  -At that visit, you will be asked to sign a consent for a possible blood transfusion in case a transfusion becomes necessary during surgery.  The need for a blood transfusion is rare but having consent is a necessary part of your care.     -You should not be taking blood thinners or aspirin at least ten days prior to surgery unless instructed by your surgeon.  -Do not take supplements such as fish oil (omega 3), red yeast rice, turmeric before your surgery. STOP TAKING AT LEAST 10 DAYS BEFORE SURGERY. You want to avoid medications with aspirin in them including headache powders such as BC or Goody's), Excedrin migraine.  -If you are taking a GLP-1 medication/injection such as Ozempic, Mounjaro, [id number], this needs to be held before surgery for at least 7 days before.  Day Before Surgery at Home -You will be asked to take in a light diet the day before surgery. You will be advised you can have clear liquids up until 3 hours before your surgery.    Eat a light diet the day before surgery.  Examples including soups, broths, toast, yogurt, mashed potatoes.  AVOID GAS PRODUCING FOODS AND BEVERAGES. Things to avoid include carbonated beverages (fizzy beverages, sodas), raw fruits and raw vegetables (uncooked), or  beans.   If your bowels are filled with gas, your surgeon will have difficulty visualizing your pelvic organs which increases your surgical risks.  Your role in recovery Your role is to become active as soon as directed by your doctor, while still giving yourself time to heal.  Rest when you feel tired. You will be asked to do the following in order to speed your recovery:  - Cough and breathe deeply. This helps to clear and expand your lungs and can prevent pneumonia after surgery.  - STAY ACTIVE WHEN YOU GET HOME. Do mild physical activity. Walking or moving your legs help your circulation and body functions return to normal. Do not try to get up or walk alone the first time after surgery.   -If you develop swelling on one leg or the other, pain in the back of your leg, redness/warmth in one of your legs, please call the office or go to the Emergency Room to have a doppler to rule out a blood clot. For shortness of breath, chest pain-seek care in the Emergency Room as soon as possible. - Actively manage your pain. Managing your pain lets you move in comfort. We will ask you to rate your pain on a scale of zero to 10. It is your responsibility to tell your doctor or nurse where and how much you hurt so your pain can be treated.  Special Considerations -If you are diabetic, you may be placed on insulin after surgery to have closer control over your blood sugars to promote healing and recovery.  This does not mean that you will be discharged  on insulin.  If applicable, your oral antidiabetics will be resumed when you are tolerating a solid diet.  -Your final pathology results from surgery should be available around one week after surgery and the results will be relayed to you when available.  -Dr. Olam Mill is the surgeon that assists your GYN Oncologist with surgery.  If you end up staying the night, the next day after your surgery you will either see Dr. Viktoria, Dr. Eldonna, or Dr. Olam Mill.  -FMLA forms can be faxed to [phone] and please allow 5-7 business days for completion.  Pain Management After Surgery -You will be prescribed your pain medication and bowel regimen medications before surgery so that you can have these available when you are discharged from the hospital. The pain medication is for use ONLY AFTER surgery and a new prescription will not be given.   -Make sure that you have Tylenol  and Ibuprofen IF YOU ARE ABLE TO TAKE THESE MEDICATIONS at home to use on a regular basis after surgery for pain control. We recommend alternating the medications every hour to six hours since they work differently and are processed in the body differently for pain relief.  -Review the attached handout on narcotic use and their risks and side effects.   Bowel Regimen -You will be prescribed Sennakot-S to take nightly to prevent constipation especially if you are taking the narcotic pain medication intermittently.  It is important to prevent constipation and drink adequate amounts of liquids. You can stop taking this medication when you are not taking pain medication and you are back on your normal bowel routine.  Risks of Surgery Risks of surgery are low but include bleeding, infection, damage to surrounding structures, re-operation, blood clots, and very rarely death.   Blood Transfusion Information (For the consent to be signed before surgery)  We will be checking your blood type before surgery so in case of emergencies, we will know what type of blood you would need.                                            WHAT IS A BLOOD TRANSFUSION?  A transfusion is the replacement of blood or some of its parts. Blood is made up of multiple cells which provide different functions. Red blood cells carry oxygen and are used for blood loss replacement. White blood cells fight against infection. Platelets control bleeding. Plasma helps clot blood. Other blood products are  available for specialized needs, such as hemophilia or other clotting disorders. BEFORE THE TRANSFUSION  Who gives blood for transfusions?  You may be able to donate blood to be used at a later date on yourself (autologous donation). Relatives can be asked to donate blood. This is generally not any safer than if you have received blood from a stranger. The same precautions are taken to ensure safety when a relative's blood is donated. Healthy volunteers who are fully evaluated to make sure their blood is safe. This is blood bank blood. Transfusion therapy is the safest it has ever been in the practice of medicine. Before blood is taken from a donor, a complete history is taken to make sure that person has no history of diseases nor engages in risky social behavior (examples are intravenous drug use or sexual activity with multiple partners). The donor's travel history is screened to minimize risk of transmitting infections,  such as malaria. The donated blood is tested for signs of infectious diseases, such as HIV and hepatitis. The blood is then tested to be sure it is compatible with you in order to minimize the chance of a transfusion reaction. If you or a relative donates blood, this is often done in anticipation of surgery and is not appropriate for emergency situations. It takes many days to process the donated blood. RISKS AND COMPLICATIONS Although transfusion therapy is very safe and saves many lives, the main dangers of transfusion include:  Getting an infectious disease. Developing a transfusion reaction. This is an allergic reaction to something in the blood you were given. Every precaution is taken to prevent this. The decision to have a blood transfusion has been considered carefully by your caregiver before blood is given. Blood is not given unless the benefits outweigh the risks.  AFTER SURGERY INSTRUCTIONS  Return to work: 4-6 weeks if applicable  Activity: 1. Be up and out of the  bed during the day.  Take a nap if needed.  You may walk up steps but be careful and use the hand rail.  Stair climbing will tire you more than you think, you may need to stop part way and rest.   2. No lifting or straining for 6 weeks over 10 pounds. No pushing, pulling, straining for 6 weeks.  3. No driving for 4-89 days when the following criteria have been met: Do not drive if you are taking narcotic pain medicine and make sure that your reaction time has returned.   4. You can shower as soon as the next day after surgery. Shower daily.  Use your regular soap and water (not directly on the incision) and pat your incision(s) dry afterwards; don't rub.  No tub baths or submerging your body in water until cleared by your surgeon. If you have the soap that was given to you by pre-surgical testing that was used before surgery, you do not need to use it afterwards because this can irritate your incisions.   5. No sexual activity and nothing in the vagina for 12 weeks.  6. You may experience a small amount of clear drainage from your incisions, which is normal.  If the drainage persists, increases, or changes color please call the office.  7. Do not use creams, lotions, or ointments such as neosporin on your incisions after surgery until advised by your surgeon because they can cause removal of the dermabond glue on your incisions.    8. You may experience vaginal spotting after surgery or when the stitches at the top of the vagina begin to dissolve.  The spotting is normal but if you experience heavy bleeding, call our office.  9. Take Tylenol  or ibuprofen first for pain if you are able to take these medications and only use narcotic pain medication for severe pain not relieved by the Tylenol  or Ibuprofen.  Monitor your Tylenol  intake to a max of 4,000 mg in a 24 hour period. You can alternate these medications after surgery.  Diet: 1. Low sodium Heart Healthy Diet is recommended but you are cleared  to resume your normal (before surgery) diet after your procedure.  2. It is safe to use a laxative, such as Miralax  or Colace, if you have difficulty moving your bowels before surgery. You have been prescribed Sennakot-S to take at bedtime every evening after surgery to keep bowel movements regular and to prevent constipation.    Wound Care: 1. Keep clean and  dry.  Shower daily.  Reasons to call the Doctor: Fever - Oral temperature greater than 100.4 degrees Fahrenheit Foul-smelling vaginal discharge Difficulty urinating Nausea and vomiting Increased pain at the site of the incision that is unrelieved with pain medicine. Difficulty breathing with or without chest pain New calf pain especially if only on one side Sudden, continuing increased vaginal bleeding with or without clots.   Contacts: For questions or concerns you should contact:  Dr. Hoy Masters at [phone]  Eleanor Epps, NP at [phone]  After Hours: call [phone] and have the GYN Oncologist paged/contacted (after 5 pm or on the weekends). You will speak with an after hours RN and let he or she know you have had surgery.  Messages sent via mychart are for non-urgent matters and are not responded to after hours so for urgent needs, please call the after hours number.

## 2024-08-30 NOTE — Progress Notes (Signed)
 Patient here for a consult with Dr. Eldonna and for a pre-operative appointment prior to her scheduled surgery on 09/14/2024. She is scheduled for a diagnostic laparoscopy, robotic assisted total laparoscopic hysterectomy, bilateral salpingo-oophorectomy, possible staging if a precancer or cancer identified, possible laparotomy.  The surgery was discussed in detail.  See after visit summary for additional details.    Discussed post-op pain management in detail including the aspects of the enhanced recovery pathway.  Advised her that a new prescription would be sent in for Tramadol  and it is only to be used for after her upcoming surgery.  We discussed the use of tylenol  post-op and to monitor for a maximum of 4,000 mg in a 24 hour period.  Also prescribed sennakot to be used after surgery and to hold if having loose stools.  Discussed bowel regimen in detail.     Discussed the use of SCDs and measures to take at home to prevent DVT including frequent mobility.  Reportable signs and symptoms of DVT discussed. Post-operative instructions discussed and expectations for after surgery. Incisional care discussed as well including reportable signs and symptoms including erythema, drainage, wound separation.     30 minutes spent with the patient.  Verbalizing understanding of material discussed. No needs or concerns voiced at the end of the visit.   Advised patient to call for any needs.  Advised that her post-operative medications had been prescribed and could be picked up at any time.    This appointment is included in the global surgical bundle as pre-operative teaching and has no charge.

## 2024-08-30 NOTE — Progress Notes (Signed)
 Gynecologic Oncology Return Clinic Visit  Date of Service: 08/30/2024 Referring Provider: Ivonne Mustache, MD   Assessment & Plan: Nina Ramos is a 78 y.o. woman with a pelvic mass and ascites noted on imaging at time of presentation to the emergency room on 08/06/2024 for abdominal pain.  Presents today for hospital follow-up and treatment discussion.  Reviewed workup to date.  CT chest/abdomen/pelvis on 08/16/2024 with the complex right ovarian mass and trace ascites without obvious peritoneal disease and no lymphadenopathy or distant metastatic disease.  Reviewed her CA125 was elevated at 127 but her CEA and CA 19-9 were within normal limits.  We reviewed that the exact etiology of the pelvic mass is unclear, but could include a benign, borderline, or malignant process.  The recommended treatment is surgical excision to make a definitive diagnosis.  Reviewed that given the mass is complex and she has an elevated CA125 this is concerning for a possible malignant process but could be still benign or borderline. Because the mass is relatively small, we feel that a minimally invasive approach is feasible, using robotic assistance.   In the event of malignancy or borderline tumor on frozen section, we will perform indicated staging procedures. We discussed that these procedures may include omentectomy pelvic and/or para-aortic lymphadenectomy, peritoneal biopsies. We would also remove any tissue concerning for metastatic disease which could require additional procedures including bowel surgery.  Patient was consented for: Diagnostic laparoscopy, robotic assisted total laparoscopic hysterectomy, bilateral salpingo-oophorectomy, possible staging, possible laparotomy on 09/14/2024.  The risks of surgery were discussed in detail and she understands these to including but not limited to bleeding requiring a blood transfusion, infection, injury to adjacent organs (including but not limited to the bowels,  bladder, ureters, nerves, blood vessels), thromboembolic events, wound separation, hernia, vaginal cuff separation, possible risk of lymphedema and lymphocyst if lymphadenectomy performed, unforseen complication, possible need for re-exploration, and possible permanent ostomy.  If the patient experiences any of these events, she understands that her hospitalization or recovery may be prolonged and that she may need to take additional medications for a prolonged period. The patient will receive DVT and antibiotic prophylaxis as indicated. She voiced a clear understanding. She had the opportunity to ask questions and informed consent was obtained today. She wishes to proceed.  She does not require preoperative clearance. Her METs are >4.  All preoperative instructions were reviewed. Postoperative expectations were also reviewed. Written handouts were provided to the patient.   RTC postop.  Hoy Masters, MD Gynecologic Oncology   Medical Decision Making I personally spent  TOTAL 60 minutes face-to-face and non-face-to-face in the care of this patient, which includes all pre, intra, and post visit time on the date of service.   ----------------------- Reason for Visit: Follow-up   Interval History: Patient presents with her daughters today.  Since hospitalization she reports that she is not having any of the low back pain that she had on presentation to the hospital.  She is eating and drinking without issue.  Denies any issue with voiding.  Reports that her bowel movements are not back to normal but are overall improved.  Used MiraLAX  once which helped.  Has bowel movements on most days but they are not quite as soft/smooth as before.  Reports that she feels a little bit more bloated again since discharge with possibly some fluid reaccumulation.   Past Medical/Surgical History: Past Medical History:  Diagnosis Date   Chronic venous insufficiency    GAD (generalized anxiety disorder)  01/14/2014   Hyperlipidemia    Lichenoid dermatitis 10/05/2018   Neuropathy 10/19/2018   Osteoporosis     No past surgical history on file.  Family History  Problem Relation Age of Onset   Asthma Mother    Heart disease Father    Breast cancer Paternal Aunt    Ovarian cancer Neg Hx    Colon cancer Neg Hx    Endometrial cancer Neg Hx     Social History   Socioeconomic History   Marital status: Married    Spouse name: Not on file   Number of children: Not on file   Years of education: Not on file   Highest education level: Not on file  Occupational History   Not on file  Tobacco Use   Smoking status: Former    Passive exposure: Never   Smokeless tobacco: Former    Quit date: 02/05/1994  Vaping Use   Vaping status: Never Used  Substance and Sexual Activity   Alcohol use: Never   Drug use: Never   Sexual activity: Not Currently  Other Topics Concern   Not on file  Social History Narrative   Not on file   Social Drivers of Health   Financial Resource Strain: Low Risk  (05/29/2023)   Overall Financial Resource Strain (CARDIA)    Difficulty of Paying Living Expenses: Not hard at all  Food Insecurity: No Food Insecurity (08/07/2024)   Hunger Vital Sign    Worried About Running Out of Food in the Last Year: Never true    Ran Out of Food in the Last Year: Never true  Transportation Needs: No Transportation Needs (08/07/2024)   PRAPARE - Administrator, Civil Service (Medical): No    Lack of Transportation (Non-Medical): No  Physical Activity: Sufficiently Active (05/29/2023)   Exercise Vital Sign    Days of Exercise per Week: 5 days    Minutes of Exercise per Session: 40 min  Stress: No Stress Concern Present (05/29/2023)   [employer] of Occupational Health - Occupational Stress Questionnaire    Feeling of Stress : Not at all  Social Connections: Moderately Integrated (08/07/2024)   Social Connection and Isolation Panel    Frequency of Communication  with Friends and Family: More than three times a week    Frequency of Social Gatherings with Friends and Family: More than three times a week    Attends Religious Services: 1 to 4 times per year    Active Member of [employer] or Organizations: No    Attends Banker Meetings: Never    Marital Status: Married    Current Medications:  Current Outpatient Medications:    alendronate  (FOSAMAX ) 70 MG tablet, Take 70 mg by mouth once a week. Take with a full glass of water on an empty stomach., Disp: , Rfl:    cephALEXin  (KEFLEX ) 500 MG capsule, Take 1 capsule (500 mg total) by mouth 3 (three) times daily. (Patient not taking: Reported on 08/26/2024), Disp: 21 capsule, Rfl: 0   gabapentin  (NEURONTIN ) 100 MG capsule, TAKE 1 CAPSULE BY MOUTH AT  BEDTIME (Patient not taking: Reported on 08/26/2024), Disp: 90 capsule, Rfl: 3  Review of Symptoms: Complete 10-system review is negative except as above in Interval History.  Physical Exam: BP (!) 168/63 (BP Location: Left Arm, Patient Position: Sitting)   Pulse 72   Temp 97.9 F (36.6 C) (Oral)   Resp 18   Ht 5' 4 (1.626 m)   Wt 130 lb (59  kg)   SpO2 100%   BMI 22.31 kg/m  General: Alert, oriented, no acute distress. HEENT: Normocephalic, atraumatic. Neck symmetric without masses. Sclera anicteric.  Chest: Normal work of breathing. Clear to auscultation bilaterally.   Cardiovascular: Regular rate and rhythm, no murmurs. Abdomen: Soft, nontender.  Normoactive bowel sounds.  No masses appreciated.   Extremities: Grossly normal range of motion.  Warm, well perfused.  No edema bilaterally. Skin: No rashes or lesions noted. Lymphatics: No cervical, supraclavicular, or inguinal adenopathy. GU: Normal appearing external genitalia without erythema, excoriation, or lesions.  Speculum exam reveals atrophic vaginal mucosa with normal cervix.  Bimanual exam reveals normal cervix small uterus, fullness of the right adnexa that is inseparable from the  uterus and extends to the posterior cul-de-sac.  Rectovaginal exam fullness in the posterior cul-de-sac without obvious direct involvement of the rectum. Exam chaperoned by Eleanor Epps, NP   Laboratory & Radiologic Studies: Surgical pathology (08/08/24): A. ASCITES, PARACENTESIS:  FINAL MICROSCOPIC DIAGNOSIS:  - No malignant cells identified  - Numerous benign/reactive mesothelial cells with acute inflammatory  cells    CT CHEST ABDOMEN PELVIS W CONTRAST 08/16/2024  Narrative CLINICAL DATA:  Adnexal mass, ovarian cancer staging * Tracking Code: BO *  EXAM: CT CHEST, ABDOMEN, AND PELVIS WITH CONTRAST  TECHNIQUE: Multidetector CT imaging of the chest, abdomen and pelvis was performed following the standard protocol during bolus administration of intravenous contrast.  RADIATION DOSE REDUCTION: This exam was performed according to the departmental dose-optimization program which includes automated exposure control, adjustment of the mA and/or kV according to patient size and/or use of iterative reconstruction technique.  CONTRAST:  100mL OMNIPAQUE IOHEXOL 300 MG/ML SOLN additional oral enteric contrast  COMPARISON:  CT abdomen pelvis, 08/06/2024  FINDINGS: CT CHEST FINDINGS  Cardiovascular: Aortic atherosclerosis. Normal heart size. Scattered left coronary artery calcifications. No pericardial effusion.  Mediastinum/Nodes: No enlarged mediastinal, hilar, or axillary lymph nodes. Small hiatal hernia. Thyroid  gland, trachea, and esophagus demonstrate no significant findings.  Lungs/Pleura: Moderate centrilobular and paraseptal emphysema. Small right, trace left pleural effusions. Clustered centrilobular and tree-in-bud nodules in the lingula (series 6, image 106).  Musculoskeletal: No chest wall abnormality. No acute osseous findings.  CT ABDOMEN PELVIS FINDINGS  Hepatobiliary: No solid liver abnormality is seen. No gallstones, gallbladder wall thickening, or  biliary dilatation.  Pancreas: Unremarkable. No pancreatic ductal dilatation or surrounding inflammatory changes.  Spleen: Normal in size without significant abnormality.  Adrenals/Urinary Tract: Adrenal glands are unremarkable. Kidneys are normal, without renal calculi, solid lesion, or hydronephrosis. Bladder is unremarkable.  Stomach/Bowel: Stomach is within normal limits. Appendix appears normal. No evidence of bowel wall thickening, distention, or inflammatory changes.  Vascular/Lymphatic: Aortic atherosclerosis. Large saphenous system varices in the left groin, which do not appear to reflect abnormal lymph nodes (series 2, image 121). No enlarged abdominal or pelvic lymph nodes.  Reproductive: Large, mixed solid and cystic mass of the right ovary, measuring 7.4 x 5.3 x 4.3 cm (series 2, image 106, series 4, image 77). There is a thick rind of soft tissue at the right aspect of the lesion and predominantly cystic composition of the left aspect of the lesion. Normal postmenopausal appearance of the left ovary. Normal uterus.  Other: No abdominal wall hernia or abnormality. Trace ascites in the low pelvis, fluid volume significantly diminished compared to prior examination.  Musculoskeletal: No acute osseous findings. Chronic sclerotic fracture of the left pubic symphysis (series 2, image 119). Unchanged superior endplate wedge deformity of L1 (series 5, image 102).  IMPRESSION: 1. Large, mixed solid and cystic mass of the right ovary, measuring 7.4 x 5.3 x 4.3 cm. There is a thick rind of soft tissue at the right aspect of the lesion and predominantly cystic composition of the left aspect of the lesion. Findings are most consistent with primary ovarian malignancy. 2. Trace ascites in the low pelvis, significantly diminished compared to prior examination with intervening diagnostic paracentesis. This is presumed malignant although there is no directly visible  peritoneal thickening or nodularity. 3. No directly visualized evidence of lymphadenopathy or metastatic disease in the chest, abdomen, or pelvis. 4. Small right, trace left pleural effusions. 5. Clustered centrilobular and tree-in-bud nodules in the lingula, most consistent with atypical mycobacterial infection. No suspicious pulmonary nodules. 6. Emphysema. 7. Coronary artery disease.  Aortic Atherosclerosis (ICD10-I70.0) and Emphysema (ICD10-J43.9).   Electronically Signed By: Marolyn JONETTA Jaksch M.D. On: 08/16/2024 20:05

## 2024-08-30 NOTE — H&P (View-Only) (Signed)
 Gynecologic Oncology Return Clinic Visit  Date of Service: 08/30/2024 Referring Provider: Ivonne Mustache, MD   Assessment & Plan: Nina Ramos is a 78 y.o. woman with a pelvic mass and ascites noted on imaging at time of presentation to the emergency room on 08/06/2024 for abdominal pain.  Presents today for hospital follow-up and treatment discussion.  Reviewed workup to date.  CT chest/abdomen/pelvis on 08/16/2024 with the complex right ovarian mass and trace ascites without obvious peritoneal disease and no lymphadenopathy or distant metastatic disease.  Reviewed her CA125 was elevated at 127 but her CEA and CA 19-9 were within normal limits.  We reviewed that the exact etiology of the pelvic mass is unclear, but could include a benign, borderline, or malignant process.  The recommended treatment is surgical excision to make a definitive diagnosis.  Reviewed that given the mass is complex and she has an elevated CA125 this is concerning for a possible malignant process but could be still benign or borderline. Because the mass is relatively small, we feel that a minimally invasive approach is feasible, using robotic assistance.   In the event of malignancy or borderline tumor on frozen section, we will perform indicated staging procedures. We discussed that these procedures may include omentectomy pelvic and/or para-aortic lymphadenectomy, peritoneal biopsies. We would also remove any tissue concerning for metastatic disease which could require additional procedures including bowel surgery.  Patient was consented for: Diagnostic laparoscopy, robotic assisted total laparoscopic hysterectomy, bilateral salpingo-oophorectomy, possible staging, possible laparotomy on 09/14/2024.  The risks of surgery were discussed in detail and she understands these to including but not limited to bleeding requiring a blood transfusion, infection, injury to adjacent organs (including but not limited to the bowels,  bladder, ureters, nerves, blood vessels), thromboembolic events, wound separation, hernia, vaginal cuff separation, possible risk of lymphedema and lymphocyst if lymphadenectomy performed, unforseen complication, possible need for re-exploration, and possible permanent ostomy.  If the patient experiences any of these events, she understands that her hospitalization or recovery may be prolonged and that she may need to take additional medications for a prolonged period. The patient will receive DVT and antibiotic prophylaxis as indicated. She voiced a clear understanding. She had the opportunity to ask questions and informed consent was obtained today. She wishes to proceed.  She does not require preoperative clearance. Her METs are >4.  All preoperative instructions were reviewed. Postoperative expectations were also reviewed. Written handouts were provided to the patient.   RTC postop.  Hoy Masters, MD Gynecologic Oncology   Medical Decision Making I personally spent  TOTAL 60 minutes face-to-face and non-face-to-face in the care of this patient, which includes all pre, intra, and post visit time on the date of service.   ----------------------- Reason for Visit: Follow-up   Interval History: Patient presents with her daughters today.  Since hospitalization she reports that she is not having any of the low back pain that she had on presentation to the hospital.  She is eating and drinking without issue.  Denies any issue with voiding.  Reports that her bowel movements are not back to normal but are overall improved.  Used MiraLAX  once which helped.  Has bowel movements on most days but they are not quite as soft/smooth as before.  Reports that she feels a little bit more bloated again since discharge with possibly some fluid reaccumulation.   Past Medical/Surgical History: Past Medical History:  Diagnosis Date   Chronic venous insufficiency    GAD (generalized anxiety disorder)  01/14/2014   Hyperlipidemia    Lichenoid dermatitis 10/05/2018   Neuropathy 10/19/2018   Osteoporosis     No past surgical history on file.  Family History  Problem Relation Age of Onset   Asthma Mother    Heart disease Father    Breast cancer Paternal Aunt    Ovarian cancer Neg Hx    Colon cancer Neg Hx    Endometrial cancer Neg Hx     Social History   Socioeconomic History   Marital status: Married    Spouse name: Not on file   Number of children: Not on file   Years of education: Not on file   Highest education level: Not on file  Occupational History   Not on file  Tobacco Use   Smoking status: Former    Passive exposure: Never   Smokeless tobacco: Former    Quit date: 02/05/1994  Vaping Use   Vaping status: Never Used  Substance and Sexual Activity   Alcohol use: Never   Drug use: Never   Sexual activity: Not Currently  Other Topics Concern   Not on file  Social History Narrative   Not on file   Social Drivers of Health   Financial Resource Strain: Low Risk  (05/29/2023)   Overall Financial Resource Strain (CARDIA)    Difficulty of Paying Living Expenses: Not hard at all  Food Insecurity: No Food Insecurity (08/07/2024)   Hunger Vital Sign    Worried About Running Out of Food in the Last Year: Never true    Ran Out of Food in the Last Year: Never true  Transportation Needs: No Transportation Needs (08/07/2024)   PRAPARE - Administrator, Civil Service (Medical): No    Lack of Transportation (Non-Medical): No  Physical Activity: Sufficiently Active (05/29/2023)   Exercise Vital Sign    Days of Exercise per Week: 5 days    Minutes of Exercise per Session: 40 min  Stress: No Stress Concern Present (05/29/2023)   [employer] of Occupational Health - Occupational Stress Questionnaire    Feeling of Stress : Not at all  Social Connections: Moderately Integrated (08/07/2024)   Social Connection and Isolation Panel    Frequency of Communication  with Friends and Family: More than three times a week    Frequency of Social Gatherings with Friends and Family: More than three times a week    Attends Religious Services: 1 to 4 times per year    Active Member of [employer] or Organizations: No    Attends Banker Meetings: Never    Marital Status: Married    Current Medications:  Current Outpatient Medications:    alendronate  (FOSAMAX ) 70 MG tablet, Take 70 mg by mouth once a week. Take with a full glass of water on an empty stomach., Disp: , Rfl:    cephALEXin  (KEFLEX ) 500 MG capsule, Take 1 capsule (500 mg total) by mouth 3 (three) times daily. (Patient not taking: Reported on 08/26/2024), Disp: 21 capsule, Rfl: 0   gabapentin  (NEURONTIN ) 100 MG capsule, TAKE 1 CAPSULE BY MOUTH AT  BEDTIME (Patient not taking: Reported on 08/26/2024), Disp: 90 capsule, Rfl: 3  Review of Symptoms: Complete 10-system review is negative except as above in Interval History.  Physical Exam: BP (!) 168/63 (BP Location: Left Arm, Patient Position: Sitting)   Pulse 72   Temp 97.9 F (36.6 C) (Oral)   Resp 18   Ht 5' 4 (1.626 m)   Wt 130 lb (59  kg)   SpO2 100%   BMI 22.31 kg/m  General: Alert, oriented, no acute distress. HEENT: Normocephalic, atraumatic. Neck symmetric without masses. Sclera anicteric.  Chest: Normal work of breathing. Clear to auscultation bilaterally.   Cardiovascular: Regular rate and rhythm, no murmurs. Abdomen: Soft, nontender.  Normoactive bowel sounds.  No masses appreciated.   Extremities: Grossly normal range of motion.  Warm, well perfused.  No edema bilaterally. Skin: No rashes or lesions noted. Lymphatics: No cervical, supraclavicular, or inguinal adenopathy. GU: Normal appearing external genitalia without erythema, excoriation, or lesions.  Speculum exam reveals atrophic vaginal mucosa with normal cervix.  Bimanual exam reveals normal cervix small uterus, fullness of the right adnexa that is inseparable from the  uterus and extends to the posterior cul-de-sac.  Rectovaginal exam fullness in the posterior cul-de-sac without obvious direct involvement of the rectum. Exam chaperoned by Eleanor Epps, NP   Laboratory & Radiologic Studies: Surgical pathology (08/08/24): A. ASCITES, PARACENTESIS:  FINAL MICROSCOPIC DIAGNOSIS:  - No malignant cells identified  - Numerous benign/reactive mesothelial cells with acute inflammatory  cells    CT CHEST ABDOMEN PELVIS W CONTRAST 08/16/2024  Narrative CLINICAL DATA:  Adnexal mass, ovarian cancer staging * Tracking Code: BO *  EXAM: CT CHEST, ABDOMEN, AND PELVIS WITH CONTRAST  TECHNIQUE: Multidetector CT imaging of the chest, abdomen and pelvis was performed following the standard protocol during bolus administration of intravenous contrast.  RADIATION DOSE REDUCTION: This exam was performed according to the departmental dose-optimization program which includes automated exposure control, adjustment of the mA and/or kV according to patient size and/or use of iterative reconstruction technique.  CONTRAST:  100mL OMNIPAQUE IOHEXOL 300 MG/ML SOLN additional oral enteric contrast  COMPARISON:  CT abdomen pelvis, 08/06/2024  FINDINGS: CT CHEST FINDINGS  Cardiovascular: Aortic atherosclerosis. Normal heart size. Scattered left coronary artery calcifications. No pericardial effusion.  Mediastinum/Nodes: No enlarged mediastinal, hilar, or axillary lymph nodes. Small hiatal hernia. Thyroid  gland, trachea, and esophagus demonstrate no significant findings.  Lungs/Pleura: Moderate centrilobular and paraseptal emphysema. Small right, trace left pleural effusions. Clustered centrilobular and tree-in-bud nodules in the lingula (series 6, image 106).  Musculoskeletal: No chest wall abnormality. No acute osseous findings.  CT ABDOMEN PELVIS FINDINGS  Hepatobiliary: No solid liver abnormality is seen. No gallstones, gallbladder wall thickening, or  biliary dilatation.  Pancreas: Unremarkable. No pancreatic ductal dilatation or surrounding inflammatory changes.  Spleen: Normal in size without significant abnormality.  Adrenals/Urinary Tract: Adrenal glands are unremarkable. Kidneys are normal, without renal calculi, solid lesion, or hydronephrosis. Bladder is unremarkable.  Stomach/Bowel: Stomach is within normal limits. Appendix appears normal. No evidence of bowel wall thickening, distention, or inflammatory changes.  Vascular/Lymphatic: Aortic atherosclerosis. Large saphenous system varices in the left groin, which do not appear to reflect abnormal lymph nodes (series 2, image 121). No enlarged abdominal or pelvic lymph nodes.  Reproductive: Large, mixed solid and cystic mass of the right ovary, measuring 7.4 x 5.3 x 4.3 cm (series 2, image 106, series 4, image 77). There is a thick rind of soft tissue at the right aspect of the lesion and predominantly cystic composition of the left aspect of the lesion. Normal postmenopausal appearance of the left ovary. Normal uterus.  Other: No abdominal wall hernia or abnormality. Trace ascites in the low pelvis, fluid volume significantly diminished compared to prior examination.  Musculoskeletal: No acute osseous findings. Chronic sclerotic fracture of the left pubic symphysis (series 2, image 119). Unchanged superior endplate wedge deformity of L1 (series 5, image 102).  IMPRESSION: 1. Large, mixed solid and cystic mass of the right ovary, measuring 7.4 x 5.3 x 4.3 cm. There is a thick rind of soft tissue at the right aspect of the lesion and predominantly cystic composition of the left aspect of the lesion. Findings are most consistent with primary ovarian malignancy. 2. Trace ascites in the low pelvis, significantly diminished compared to prior examination with intervening diagnostic paracentesis. This is presumed malignant although there is no directly visible  peritoneal thickening or nodularity. 3. No directly visualized evidence of lymphadenopathy or metastatic disease in the chest, abdomen, or pelvis. 4. Small right, trace left pleural effusions. 5. Clustered centrilobular and tree-in-bud nodules in the lingula, most consistent with atypical mycobacterial infection. No suspicious pulmonary nodules. 6. Emphysema. 7. Coronary artery disease.  Aortic Atherosclerosis (ICD10-I70.0) and Emphysema (ICD10-J43.9).   Electronically Signed By: Marolyn JONETTA Jaksch M.D. On: 08/16/2024 20:05

## 2024-09-03 ENCOUNTER — Encounter: Payer: Self-pay | Admitting: Gynecologic Oncology

## 2024-09-06 ENCOUNTER — Telehealth: Payer: Self-pay

## 2024-09-06 NOTE — Telephone Encounter (Signed)
 Pt daughter call in to check the status of an FMLA form. I let her know that I will give her message to GYN. Her mother is a pt of Dr. Eldonna.Daughter verbalized understanding.

## 2024-09-07 NOTE — Patient Instructions (Signed)
 SURGICAL WAITING ROOM VISITATION  Patients having surgery or a procedure may have no more than 2 support people in the waiting area - these visitors may rotate.    Children under the age of 50 must have an adult with them who is not the patient.  Visitors with respiratory illnesses are discouraged from visiting and should remain at home.  If the patient needs to stay at the hospital during part of their recovery, the visitor guidelines for inpatient rooms apply. Pre-op nurse will coordinate an appropriate time for 1 support person to accompany patient in pre-op.  This support person may not rotate.    Please refer to the [facility] website for the visitor guidelines for Inpatients (after your surgery is over and you are in a regular room).       Your procedure is scheduled on: 09-07-24   Report to [facility] Main Entrance    Report to admitting at        0845  AM   Call this number if you have problems the morning of surgery [phone]  Eat a light diet the day before surgery.  Examples including soups, broths, toast, yogurt, mashed potatoes.  Things to avoid include carbonated beverages (fizzy beverages), raw fruits and raw vegetables, or beans.   If your bowels are filled with gas, your surgeon will have difficulty visualizing your pelvic organs which increases your surgical risks.   Do not eat food :After Midnight.   After Midnight you may have the following liquids until __0800____ AM/  DAY OF SURGERY  then nothing by mouth  Water Non-Citrus Juices (without pulp, NO RED-Apple, White grape, White cranberry) Black Coffee (NO MILK/CREAM OR CREAMERS, sugar ok)  Clear Tea (NO MILK/CREAM OR CREAMERS, sugar ok) regular and decaf                             Plain Jell-O (NO RED)                                           Fruit ices (not with fruit pulp, NO RED)                                     Popsicles (NO RED)                                                                Sports drinks like Gatorade (NO RED)                             If you have questions, please contact your surgeon's office.   FOLLOW ANY ADDITIONAL PRE OP INSTRUCTIONS YOU RECEIVED FROM YOUR SURGEON'S OFFICE!!!     Oral Hygiene is also important to reduce your risk of infection.                                    Remember - BRUSH YOUR  TEETH THE MORNING OF SURGERY WITH YOUR REGULAR TOOTHPASTE  DENTURES WILL BE REMOVED PRIOR TO SURGERY PLEASE DO NOT APPLY Poly grip OR ADHESIVES!!!   Do NOT smoke after Midnight   Stop all vitamins and herbal supplements 7 days before surgery.   Take these medicines the morning of surgery with A SIP OF WATER: Tylenol  if needed                                You may not have any metal on your body including hair pins, jewelry, and body piercing             Do not wear make-up, lotions, powders, perfumes/cologne, or deodorant  Do not wear nail polish including gel and S&S, artificial/acrylic nails, or any other type of covering on natural nails including finger and toenails. If you have artificial nails, gel coating, etc. that needs to be removed by a nail salon please have this removed prior to surgery or surgery may need to be canceled/ delayed if the surgeon/ anesthesia feels like they are unable to be safely monitored.   Do not shave  48 hours prior to surgery.             Do not bring valuables to the hospital. [place] IS NOT             RESPONSIBLE   FOR VALUABLES.   Contacts, glasses, dentures or bridgework may not be worn into surgery.   Bring small overnight bag day of surgery.   DO NOT BRING YOUR HOME MEDICATIONS TO THE HOSPITAL. PHARMACY WILL DISPENSE MEDICATIONS LISTED ON YOUR MEDICATION LIST TO YOU DURING YOUR ADMISSION IN THE HOSPITAL!    Patients discharged on the day of surgery will not be allowed to drive home.  Someone NEEDS to stay with you for the first 24 hours after anesthesia.   Special Instructions: Bring  a copy of your healthcare power of attorney and living will documents the day of surgery if you haven't scanned them before.              Please read over the following fact sheets you were given: IF YOU HAVE QUESTIONS ABOUT YOUR PRE-OP INSTRUCTIONS PLEASE CALL [phone].   . If you test positive for Covid or have been in contact with anyone that has tested positive in the last 10 days please notify you surgeon.    [facility] - Preparing for Surgery Before surgery, you can play an important role.  Because skin is not sterile, your skin needs to be as free of germs as possible.  You can reduce the number of germs on your skin by washing with CHG (chlorahexidine gluconate) soap before surgery.  CHG is an antiseptic cleaner which kills germs and bonds with the skin to continue killing germs even after washing. Please DO NOT use if you have an allergy to CHG or antibacterial soaps.  If your skin becomes reddened/irritated stop using the CHG and inform your nurse when you arrive at Short Stay. Do not shave (including legs and underarms) for at least 48 hours prior to the first CHG shower.  You may shave your face/neck.  Please follow these instructions carefully:  1.  Shower with CHG Soap the night before surgery ONLY (DO NOT USE THE SOAP THE MORNING OF SURGERY).  2.  If you choose to wash your hair, wash your hair first as usual with your  normal  shampoo.  3.  After you shampoo, rinse your hair and body thoroughly to remove the shampoo.                             4.  Use CHG as you would any other liquid soap.  You can apply chg directly to the skin and wash.  Gently with a scrungie or clean washcloth.  5.  Apply the CHG Soap to your body ONLY FROM THE NECK DOWN.   Do   not use on face/ open                           Wound or open sores. Avoid contact with eyes, ears mouth and   genitals (private parts).                       Wash face,  Genitals (private parts) with your normal soap.             6.   Wash thoroughly, paying special attention to the area where your    surgery  will be performed.  7.  Thoroughly rinse your body with warm water from the neck down.  8.  DO NOT shower/wash with your normal soap after using and rinsing off the CHG Soap.                9.  Pat yourself dry with a clean towel.            10.  Wear clean pajamas.            11.  Place clean sheets on your bed the night of your first shower and do not  sleep with pets. Day of Surgery : Do not apply any CHG, lotions/deodorants the morning of surgery.  Please wear clean clothes to the hospital/surgery center.  FAILURE TO FOLLOW THESE INSTRUCTIONS MAY RESULT IN THE CANCELLATION OF YOUR SURGERY  PATIENT SIGNATURE_________________________________  NURSE SIGNATURE__________________________________  ________________________________________________________________________ WHAT IS A BLOOD TRANSFUSION? Blood Transfusion Information  A transfusion is the replacement of blood or some of its parts. Blood is made up of multiple cells which provide different functions. Red blood cells carry oxygen and are used for blood loss replacement. White blood cells fight against infection. Platelets control bleeding. Plasma helps clot blood. Other blood products are available for specialized needs, such as hemophilia or other clotting disorders. BEFORE THE TRANSFUSION  Who gives blood for transfusions?  Healthy volunteers who are fully evaluated to make sure their blood is safe. This is blood bank blood. Transfusion therapy is the safest it has ever been in the practice of medicine. Before blood is taken from a donor, a complete history is taken to make sure that person has no history of diseases nor engages in risky social behavior (examples are intravenous drug use or sexual activity with multiple partners). The donor's travel history is screened to minimize risk of transmitting infections, such as malaria. The donated blood is  tested for signs of infectious diseases, such as HIV and hepatitis. The blood is then tested to be sure it is compatible with you in order to minimize the chance of a transfusion reaction. If you or a relative donates blood, this is often done in anticipation of surgery and is not appropriate for emergency situations. It takes many days to process the donated blood. RISKS AND COMPLICATIONS Although  transfusion therapy is very safe and saves many lives, the main dangers of transfusion include:  Getting an infectious disease. Developing a transfusion reaction. This is an allergic reaction to something in the blood you were given. Every precaution is taken to prevent this. The decision to have a blood transfusion has been considered carefully by your caregiver before blood is given. Blood is not given unless the benefits outweigh the risks. AFTER THE TRANSFUSION Right after receiving a blood transfusion, you will usually feel much better and more energetic. This is especially true if your red blood cells have gotten low (anemic). The transfusion raises the level of the red blood cells which carry oxygen, and this usually causes an energy increase. The nurse administering the transfusion will monitor you carefully for complications. HOME CARE INSTRUCTIONS  No special instructions are needed after a transfusion. You may find your energy is better. Speak with your caregiver about any limitations on activity for underlying diseases you may have. SEEK MEDICAL CARE IF:  Your condition is not improving after your transfusion. You develop redness or irritation at the intravenous (IV) site. SEEK IMMEDIATE MEDICAL CARE IF:  Any of the following symptoms occur over the next 12 hours: Shaking chills. You have a temperature by mouth above 102 F (38.9 C), not controlled by medicine. Chest, back, or muscle pain. People around you feel you are not acting correctly or are confused. Shortness of breath or  difficulty breathing. Dizziness and fainting. You get a rash or develop hives. You have a decrease in urine output. Your urine turns a dark color or changes to pink, red, or brown. Any of the following symptoms occur over the next 10 days: You have a temperature by mouth above 102 F (38.9 C), not controlled by medicine. Shortness of breath. Weakness after normal activity. The white part of the eye turns yellow (jaundice). You have a decrease in the amount of urine or are urinating less often. Your urine turns a dark color or changes to pink, red, or brown. Document Released: 11/01/2000 Document Revised: 01/27/2012 Document Reviewed: 06/20/2008 [facility] Patient Information 2014 [place], [place].  _______________________________________________________________________

## 2024-09-07 NOTE — Progress Notes (Signed)
 PCP - Butler Chalet, MD LOV 08-12-24 epic Cardiologist - n/a  PPM/ICD -  Device Orders -  Rep Notified -   Chest x-ray -  EKG -  Stress Test -  ECHO -  Cardiac Cath -   Sleep Study - n/a CPAP - n/a  Fasting Blood Sugar - n/a Checks Blood Sugar __n/a___ times a day  Blood Thinner Instructions:n/a Aspirin Instructions:n/a  ERAS Protcol - PRE-SURGERY n/a   COVID vaccine -yes  Activity--Able to climb a flight of stairs with no CP or SOB Anesthesia review: HTN no current meds  Patient denies shortness of breath, fever, cough and chest pain at PAT appointment   All instructions explained to the patient, with a verbal understanding of the material. Patient agrees to go over the instructions while at home for a better understanding. Patient also instructed to self quarantine after being tested for COVID-19. The opportunity to ask questions was provided.

## 2024-09-08 ENCOUNTER — Encounter (HOSPITAL_COMMUNITY): Payer: Self-pay

## 2024-09-08 ENCOUNTER — Encounter (HOSPITAL_COMMUNITY)
Admission: RE | Admit: 2024-09-08 | Discharge: 2024-09-08 | Disposition: A | Discharge status: Home / Self Care | Source: Ambulatory Visit | Attending: Psychiatry | Admitting: Psychiatry

## 2024-09-08 ENCOUNTER — Telehealth: Payer: Self-pay

## 2024-09-08 ENCOUNTER — Other Ambulatory Visit: Payer: Self-pay

## 2024-09-08 VITALS — BP 139/68 | HR 78 | Temp 97.8°F | Resp 16 | Ht 64.0 in | Wt 123.4 lb

## 2024-09-08 DIAGNOSIS — I10 Essential (primary) hypertension: Secondary | ICD-10-CM | POA: Insufficient documentation

## 2024-09-08 DIAGNOSIS — Z01818 Encounter for other preprocedural examination: Secondary | ICD-10-CM | POA: Diagnosis not present

## 2024-09-08 DIAGNOSIS — N9489 Other specified conditions associated with female genital organs and menstrual cycle: Secondary | ICD-10-CM | POA: Insufficient documentation

## 2024-09-08 HISTORY — DX: Myoneural disorder, unspecified: G70.9

## 2024-09-08 HISTORY — DX: Pneumonia, unspecified organism: J18.9

## 2024-09-08 HISTORY — DX: Personal history of urinary calculi: Z87.442

## 2024-09-08 LAB — CBC
HCT: 41.5 % (ref 36.0–46.0)
Hemoglobin: 13.3 g/dL (ref 12.0–15.0)
MCH: 31.8 pg (ref 26.0–34.0)
MCHC: 32 g/dL (ref 30.0–36.0)
MCV: 99.3 fL (ref 80.0–100.0)
Platelets: 198 K/uL (ref 150–400)
RBC: 4.18 MIL/uL (ref 3.87–5.11)
RDW: 14.2 % (ref 11.5–15.5)
WBC: 3.3 K/uL — ABNORMAL LOW (ref 4.0–10.5)
nRBC: 0 % (ref 0.0–0.2)

## 2024-09-08 LAB — COMPREHENSIVE METABOLIC PANEL WITH GFR
ALT: 16 U/L (ref 0–44)
AST: 25 U/L (ref 15–41)
Albumin: 4.2 g/dL (ref 3.5–5.0)
Alkaline Phosphatase: 47 U/L (ref 38–126)
Anion gap: 8 (ref 5–15)
BUN: 22 mg/dL (ref 8–23)
CO2: 27 mmol/L (ref 22–32)
Calcium: 9.2 mg/dL (ref 8.9–10.3)
Chloride: 107 mmol/L (ref 98–111)
Creatinine, Ser: 1.02 mg/dL — ABNORMAL HIGH (ref 0.44–1.00)
GFR, Estimated: 56 mL/min — ABNORMAL LOW (ref >60)
Glucose, Bld: 87 mg/dL (ref 70–99)
Potassium: 4.1 mmol/L (ref 3.5–5.1)
Sodium: 142 mmol/L (ref 135–145)
Total Bilirubin: 0.3 mg/dL (ref 0.0–1.2)
Total Protein: 6.6 g/dL (ref 6.5–8.1)

## 2024-09-08 NOTE — Telephone Encounter (Signed)
 FMLA forms,for intermittent leave, received from [employer] for Ms.Scotts daughter, Nina Ramos.  Requested information provided and faxed to [employer].   Pt's daughter aware via phone call.

## 2024-09-13 ENCOUNTER — Telehealth: Payer: Self-pay | Admitting: *Deleted

## 2024-09-13 NOTE — Telephone Encounter (Signed)
Telephone call to check on pre-operative status.  Patient compliant with pre-operative instructions.  Reinforced nothing to eat after midnight. Clear liquids until 0745. Patient to arrive at 0845.  No questions or concerns voiced.  Instructed to call for any needs.  °

## 2024-09-14 ENCOUNTER — Encounter (HOSPITAL_COMMUNITY): Payer: Self-pay | Admitting: Psychiatry

## 2024-09-14 ENCOUNTER — Ambulatory Visit (HOSPITAL_BASED_OUTPATIENT_CLINIC_OR_DEPARTMENT_OTHER): Admitting: Anesthesiology

## 2024-09-14 ENCOUNTER — Encounter (HOSPITAL_COMMUNITY)
Admission: RE | Disposition: A | Discharge status: Home / Self Care | Payer: Self-pay | Source: Ambulatory Visit | Attending: Psychiatry

## 2024-09-14 ENCOUNTER — Observation Stay (HOSPITAL_COMMUNITY)
Admission: RE | Admit: 2024-09-14 | Discharge: 2024-09-15 | Disposition: A | Discharge status: Home / Self Care | Source: Ambulatory Visit | Attending: Psychiatry | Admitting: Psychiatry

## 2024-09-14 ENCOUNTER — Ambulatory Visit (HOSPITAL_COMMUNITY): Payer: Self-pay | Admitting: Physician Assistant

## 2024-09-14 DIAGNOSIS — N9489 Other specified conditions associated with female genital organs and menstrual cycle: Principal | ICD-10-CM

## 2024-09-14 DIAGNOSIS — I10 Essential (primary) hypertension: Secondary | ICD-10-CM | POA: Diagnosis not present

## 2024-09-14 DIAGNOSIS — Z87891 Personal history of nicotine dependence: Secondary | ICD-10-CM | POA: Diagnosis not present

## 2024-09-14 DIAGNOSIS — R1900 Intra-abdominal and pelvic swelling, mass and lump, unspecified site: Secondary | ICD-10-CM | POA: Diagnosis not present

## 2024-09-14 DIAGNOSIS — M810 Age-related osteoporosis without current pathological fracture: Secondary | ICD-10-CM | POA: Insufficient documentation

## 2024-09-14 DIAGNOSIS — C5701 Malignant neoplasm of right fallopian tube: Secondary | ICD-10-CM | POA: Diagnosis not present

## 2024-09-14 DIAGNOSIS — C561 Malignant neoplasm of right ovary: Principal | ICD-10-CM | POA: Insufficient documentation

## 2024-09-14 HISTORY — PX: LAPAROSCOPIC TOTAL PELVIC LYMPHADENECTOMY: SHX7605

## 2024-09-14 HISTORY — PX: ROBOTIC ASSISTED TOTAL HYSTERECTOMY WITH BILATERAL SALPINGO OOPHERECTOMY: SHX6086

## 2024-09-14 LAB — TYPE AND SCREEN
ABO/RH(D): A POS
Antibody Screen: NEGATIVE

## 2024-09-14 LAB — ABO/RH: ABO/RH(D): A POS

## 2024-09-14 SURGERY — HYSTERECTOMY, TOTAL, ROBOT-ASSISTED, LAPAROSCOPIC, WITH BILATERAL SALPINGO-OOPHORECTOMY
Anesthesia: General | Laterality: Right

## 2024-09-14 MED ORDER — ONDANSETRON HCL 4 MG PO TABS: 4.0000 mg | ORAL_TABLET | Freq: Four times a day (QID) | ORAL | Status: DC | PRN

## 2024-09-14 MED ORDER — PROPOFOL 500 MG/50ML IV EMUL
INTRAVENOUS | Status: AC
  Filled 2024-09-14: qty 50

## 2024-09-14 MED ORDER — SUGAMMADEX SODIUM 200 MG/2ML IV SOLN
INTRAVENOUS | Status: AC
  Filled 2024-09-14: qty 2

## 2024-09-14 MED ORDER — PROPOFOL 1000 MG/100ML IV EMUL
INTRAVENOUS | Status: AC
  Filled 2024-09-14: qty 200

## 2024-09-14 MED ORDER — ORAL CARE MOUTH RINSE: 15.0000 mL | Freq: Once | OROMUCOSAL | Status: DC

## 2024-09-14 MED ORDER — ONDANSETRON HCL 4 MG/2ML IJ SOLN
INTRAMUSCULAR | Status: AC
Start: 2024-09-14 — End: 2024-09-14
  Filled 2024-09-14: qty 2

## 2024-09-14 MED ORDER — ONDANSETRON HCL 4 MG/2ML IJ SOLN
INTRAMUSCULAR | Status: AC
  Filled 2024-09-14: qty 2

## 2024-09-14 MED ORDER — SODIUM CHLORIDE (PF) 0.9 % IJ SOLN
INTRAMUSCULAR | Status: AC
  Filled 2024-09-14: qty 20

## 2024-09-14 MED ORDER — ONDANSETRON HCL 4 MG/2ML IJ SOLN
4.0000 mg | Freq: Four times a day (QID) | INTRAMUSCULAR | Status: DC | PRN
  Administered 2024-09-14 – 2024-09-15 (×2): 4 mg via INTRAVENOUS
  Filled 2024-09-14 (×2): qty 2

## 2024-09-14 MED ORDER — HYDROCODONE-ACETAMINOPHEN 5-325 MG PO TABS: 1.0000 | ORAL_TABLET | ORAL | Status: DC | PRN

## 2024-09-14 MED ORDER — CEFAZOLIN SODIUM 1 G IJ SOLR
INTRAMUSCULAR | Status: AC
  Filled 2024-09-14: qty 20

## 2024-09-14 MED ORDER — SENNA 8.6 MG PO TABS
1.0000 | ORAL_TABLET | Freq: Two times a day (BID) | ORAL | Status: DC
  Administered 2024-09-15 (×2): 8.6 mg via ORAL
  Filled 2024-09-14 (×2): qty 1

## 2024-09-14 MED ORDER — ONDANSETRON HCL 4 MG/2ML IJ SOLN
INTRAMUSCULAR | Status: DC | PRN
  Administered 2024-09-14: 4 mg via INTRAVENOUS

## 2024-09-14 MED ORDER — LIDOCAINE HCL (PF) 2 % IJ SOLN: INTRAMUSCULAR | Status: DC | PRN

## 2024-09-14 MED ORDER — SURGIFLO WITH THROMBIN (HEMOSTATIC MATRIX KIT) OPTIME
TOPICAL | Status: DC | PRN
  Administered 2024-09-14: 1 via TOPICAL

## 2024-09-14 MED ORDER — HYDROMORPHONE HCL 1 MG/ML IJ SOLN
INTRAMUSCULAR | Status: DC | PRN
  Administered 2024-09-14: 1 mg via INTRAVENOUS

## 2024-09-14 MED ORDER — LIDOCAINE 2% (20 MG/ML) 5 ML SYRINGE
INTRAMUSCULAR | Status: DC | PRN
  Administered 2024-09-14: 100 mg via INTRAVENOUS
  Administered 2024-09-14: 1.5 mg/kg/h via INTRAVENOUS

## 2024-09-14 MED ORDER — HYDROMORPHONE HCL 2 MG/ML IJ SOLN
INTRAMUSCULAR | Status: AC
  Filled 2024-09-14: qty 1

## 2024-09-14 MED ORDER — NALOXONE HCL 0.4 MG/ML IJ SOLN
INTRAMUSCULAR | Status: DC | PRN
  Administered 2024-09-14: 20 ug via INTRAVENOUS
  Administered 2024-09-14: 40 ug via INTRAVENOUS
  Administered 2024-09-14: 20 ug via INTRAVENOUS

## 2024-09-14 MED ORDER — APIXABAN 2.5 MG PO TABS: 2.5000 mg | ORAL_TABLET | Freq: Two times a day (BID) | ORAL | 0 refills | Status: DC

## 2024-09-14 MED ORDER — FENTANYL CITRATE (PF) 100 MCG/2ML IJ SOLN
INTRAMUSCULAR | Status: AC
  Filled 2024-09-14: qty 2

## 2024-09-14 MED ORDER — PHENYLEPHRINE HCL-NACL 20-0.9 MG/250ML-% IV SOLN
INTRAVENOUS | Status: DC | PRN
  Administered 2024-09-14: 20 ug/min via INTRAVENOUS

## 2024-09-14 MED ORDER — PROPOFOL 10 MG/ML IV BOLUS
INTRAVENOUS | Status: DC | PRN
  Administered 2024-09-14: 30 mg via INTRAVENOUS
  Administered 2024-09-14: 90 mg via INTRAVENOUS

## 2024-09-14 MED ORDER — FENTANYL CITRATE (PF) 250 MCG/5ML IJ SOLN
INTRAMUSCULAR | Status: AC
  Filled 2024-09-14: qty 5

## 2024-09-14 MED ORDER — SODIUM CHLORIDE 0.9 % IV SOLN: INTRAVENOUS | Status: DC | PRN

## 2024-09-14 MED ORDER — LACTATED RINGERS IR SOLN
Status: DC | PRN
  Administered 2024-09-14: 1

## 2024-09-14 MED ORDER — CHLORHEXIDINE GLUCONATE 0.12 % MT SOLN: 15.0000 mL | Freq: Once | OROMUCOSAL | Status: DC

## 2024-09-14 MED ORDER — LIDOCAINE HCL (PF) 2 % IJ SOLN
INTRAMUSCULAR | Status: AC
  Filled 2024-09-14: qty 20

## 2024-09-14 MED ORDER — HEPARIN SODIUM (PORCINE) 5000 UNIT/ML IJ SOLN
5000.0000 [IU] | INTRAMUSCULAR | Status: AC
  Administered 2024-09-14: 5000 [IU] via SUBCUTANEOUS
  Filled 2024-09-14: qty 1

## 2024-09-14 MED ORDER — ALUM & MAG HYDROXIDE-SIMETH 200-200-20 MG/5ML PO SUSP: 30.0000 mL | ORAL | Status: DC | PRN

## 2024-09-14 MED ORDER — DROPERIDOL 2.5 MG/ML IJ SOLN: 0.6250 mg | Freq: Once | INTRAMUSCULAR | Status: DC | PRN

## 2024-09-14 MED ORDER — CEFAZOLIN SODIUM-DEXTROSE 2-4 GM/100ML-% IV SOLN
2.0000 g | INTRAVENOUS | Status: AC
  Administered 2024-09-14 (×2): 2 g via INTRAVENOUS
  Filled 2024-09-14: qty 100

## 2024-09-14 MED ORDER — PHENYLEPHRINE 80 MCG/ML (10ML) SYRINGE FOR IV PUSH (FOR BLOOD PRESSURE SUPPORT)
PREFILLED_SYRINGE | INTRAVENOUS | Status: DC | PRN
  Administered 2024-09-14: 160 ug via INTRAVENOUS

## 2024-09-14 MED ORDER — PROPOFOL 500 MG/50ML IV EMUL
INTRAVENOUS | Status: DC | PRN
  Administered 2024-09-14: 125 ug/kg/min via INTRAVENOUS

## 2024-09-14 MED ORDER — ROCURONIUM BROMIDE 10 MG/ML (PF) SYRINGE
PREFILLED_SYRINGE | INTRAVENOUS | Status: AC
  Filled 2024-09-14: qty 10

## 2024-09-14 MED ORDER — POLYETHYLENE GLYCOL 3350 17 G PO PACK: 17.0000 g | PACK | Freq: Every day | ORAL | Status: DC | PRN

## 2024-09-14 MED ORDER — HYDROMORPHONE HCL 1 MG/ML IJ SOLN: 0.2000 mg | INTRAMUSCULAR | Status: DC | PRN

## 2024-09-14 MED ORDER — METRONIDAZOLE 500 MG/100ML IV SOLN
500.0000 mg | INTRAVENOUS | Status: AC
  Administered 2024-09-14: 500 mg via INTRAVENOUS
  Filled 2024-09-14: qty 100

## 2024-09-14 MED ORDER — SUGAMMADEX SODIUM 200 MG/2ML IV SOLN
INTRAVENOUS | Status: DC | PRN
  Administered 2024-09-14: 200 mg via INTRAVENOUS

## 2024-09-14 MED ORDER — IBUPROFEN 400 MG PO TABS
600.0000 mg | ORAL_TABLET | Freq: Four times a day (QID) | ORAL | Status: DC
  Administered 2024-09-15 (×3): 600 mg via ORAL
  Filled 2024-09-14 (×4): qty 1

## 2024-09-14 MED ORDER — LACTATED RINGERS IV SOLN: INTRAVENOUS | Status: DC | PRN

## 2024-09-14 MED ORDER — ENOXAPARIN SODIUM 40 MG/0.4ML IJ SOSY
40.0000 mg | PREFILLED_SYRINGE | INTRAMUSCULAR | Status: DC
  Administered 2024-09-15: 40 mg via SUBCUTANEOUS
  Filled 2024-09-14: qty 0.4

## 2024-09-14 MED ORDER — ROCURONIUM BROMIDE 100 MG/10ML IV SOLN
INTRAVENOUS | Status: DC | PRN
  Administered 2024-09-14 (×3): 10 mg via INTRAVENOUS
  Administered 2024-09-14 (×2): 15 mg via INTRAVENOUS
  Administered 2024-09-14: 20 mg via INTRAVENOUS
  Administered 2024-09-14: 10 mg via INTRAVENOUS
  Administered 2024-09-14: 40 mg via INTRAVENOUS
  Administered 2024-09-14: 15 mg via INTRAVENOUS

## 2024-09-14 MED ORDER — ACETAMINOPHEN 500 MG PO TABS: 1000.0000 mg | ORAL_TABLET | Freq: Once | ORAL | Status: DC

## 2024-09-14 MED ORDER — LACTATED RINGERS IV SOLN: INTRAVENOUS | Status: DC

## 2024-09-14 MED ORDER — STERILE WATER FOR IRRIGATION IR SOLN
Status: DC | PRN
  Administered 2024-09-14: 1000 mL

## 2024-09-14 MED ORDER — POVIDONE-IODINE 10 % EX SWAB: 2.0000 | Freq: Once | CUTANEOUS | Status: DC

## 2024-09-14 MED ORDER — PROPOFOL 1000 MG/100ML IV EMUL
INTRAVENOUS | Status: AC
Start: 2024-09-14 — End: 2024-09-14
  Filled 2024-09-14: qty 100

## 2024-09-14 MED ORDER — PROPOFOL 10 MG/ML IV BOLUS
INTRAVENOUS | Status: AC
  Filled 2024-09-14: qty 20

## 2024-09-14 MED ORDER — ACETAMINOPHEN 500 MG PO TABS
1000.0000 mg | ORAL_TABLET | ORAL | Status: AC
  Administered 2024-09-14: 1000 mg via ORAL
  Filled 2024-09-14: qty 2

## 2024-09-14 MED ORDER — SIMETHICONE 80 MG PO CHEW
80.0000 mg | CHEWABLE_TABLET | Freq: Four times a day (QID) | ORAL | Status: DC | PRN
  Administered 2024-09-15: 80 mg via ORAL
  Filled 2024-09-14: qty 1

## 2024-09-14 MED ORDER — FENTANYL CITRATE (PF) 250 MCG/5ML IJ SOLN
INTRAMUSCULAR | Status: DC | PRN
  Administered 2024-09-14: 50 ug via INTRAVENOUS
  Administered 2024-09-14: 25 ug via INTRAVENOUS
  Administered 2024-09-14: 50 ug via INTRAVENOUS
  Administered 2024-09-14 (×2): 25 ug via INTRAVENOUS
  Administered 2024-09-14: 50 ug via INTRAVENOUS
  Administered 2024-09-14 (×3): 25 ug via INTRAVENOUS
  Administered 2024-09-14: 50 ug via INTRAVENOUS

## 2024-09-14 MED ORDER — BUPIVACAINE HCL (PF) 0.25 % IJ SOLN
INTRAMUSCULAR | Status: DC | PRN
  Administered 2024-09-14: 20 mL

## 2024-09-14 MED ORDER — DEXAMETHASONE SOD PHOSPHATE PF 10 MG/ML IJ SOLN
4.0000 mg | INTRAMUSCULAR | Status: AC
  Administered 2024-09-14: 4 mg via INTRAVENOUS

## 2024-09-14 MED ORDER — TRAMADOL HCL 50 MG PO TABS
50.0000 mg | ORAL_TABLET | Freq: Four times a day (QID) | ORAL | Status: DC | PRN
  Filled 2024-09-14 (×2): qty 1

## 2024-09-14 MED ORDER — FENTANYL CITRATE (PF) 50 MCG/ML IJ SOSY: 25.0000 ug | PREFILLED_SYRINGE | INTRAMUSCULAR | Status: DC | PRN

## 2024-09-14 SURGICAL SUPPLY — 86 items
APPLICATOR SURGIFLO ENDO (HEMOSTASIS) ×1 IMPLANT
BAG COUNTER SPONGE SURGICOUNT (BAG) IMPLANT
BAG LAPAROSCOPIC 12 15 PORT 16 (BASKET) ×2 IMPLANT
BLADE SURG SZ10 CARB STEEL (BLADE) IMPLANT
CABLE HIGH FREQUENCY MONO STRZ (ELECTRODE) IMPLANT
CHLORAPREP W/TINT 26 (MISCELLANEOUS) ×3 IMPLANT
CNTNR URN SCR LID CUP LEK RST (MISCELLANEOUS) ×2 IMPLANT
COVER BACK TABLE 60X90IN (DRAPES) ×3 IMPLANT
COVER SURGICAL LIGHT HANDLE (MISCELLANEOUS) ×3 IMPLANT
COVER TIP SHEARS 8 DVNC (MISCELLANEOUS) ×3 IMPLANT
DERMABOND ADVANCED .7 DNX12 (GAUZE/BANDAGES/DRESSINGS) ×4 IMPLANT
DRAPE ARM DVNC X/XI (DISPOSABLE) ×12 IMPLANT
DRAPE COLUMN DVNC XI (DISPOSABLE) ×3 IMPLANT
DRAPE SHEET LG 3/4 BI-LAMINATE (DRAPES) ×3 IMPLANT
DRAPE SURG IRRIG POUCH 19X23 (DRAPES) ×3 IMPLANT
DRIVER NDL MEGA SUTCUT DVNCXI (INSTRUMENTS) ×2 IMPLANT
DRIVER NDLE MEGA SUTCUT DVNCXI (INSTRUMENTS) ×3 IMPLANT
DRSG OPSITE POSTOP 4X6 (GAUZE/BANDAGES/DRESSINGS) IMPLANT
DRSG OPSITE POSTOP 4X8 (GAUZE/BANDAGES/DRESSINGS) IMPLANT
DRSG TEGADERM 6X8 (GAUZE/BANDAGES/DRESSINGS) ×1 IMPLANT
ELECT PENCIL ROCKER SW 15FT (MISCELLANEOUS) ×1 IMPLANT
ELECT REM PT RETURN 15FT ADLT (MISCELLANEOUS) ×3 IMPLANT
FORCEPS BPLR FENES DVNC XI (FORCEP) ×3 IMPLANT
FORCEPS PROGRASP DVNC XI (FORCEP) ×3 IMPLANT
GAUZE 4X4 16PLY ~~LOC~~+RFID DBL (SPONGE) ×3 IMPLANT
GLOVE BIO SURGEON STRL SZ 6.5 (GLOVE) ×3 IMPLANT
GLOVE BIOGEL PI IND STRL 6.5 (GLOVE) ×6 IMPLANT
GLOVE BIOGEL PI MICRO STRL 6 (GLOVE) ×12 IMPLANT
GOWN STRL REUS W/ TWL LRG LVL3 (GOWN DISPOSABLE) ×12 IMPLANT
GRASPER SUT TROCAR 14GX15 (MISCELLANEOUS) ×1 IMPLANT
HOLDER FOLEY CATH W/STRAP (MISCELLANEOUS) IMPLANT
IRRIGATION SUCT STRKRFLW 2 WTP (MISCELLANEOUS) ×3 IMPLANT
KIT BASIN OR (CUSTOM PROCEDURE TRAY) ×3 IMPLANT
KIT PROCEDURE DVNC SI (MISCELLANEOUS) IMPLANT
KIT TURNOVER KIT A (KITS) ×3 IMPLANT
LIGASURE BLUNT TIP 5 LONG 44CM (ELECTROSURGICAL) IMPLANT
LIGASURE IMPACT 36 18CM CVD LR (INSTRUMENTS) IMPLANT
MANIPULATOR ADVINCU DEL 3.0 PL (MISCELLANEOUS) ×1 IMPLANT
MANIPULATOR ADVINCU DEL 3.5 PL (MISCELLANEOUS) IMPLANT
MANIPULATOR UTERINE 4.5 ZUMI (MISCELLANEOUS) IMPLANT
NDL HYPO 21X1.5 SAFETY (NEEDLE) ×2 IMPLANT
NDL INSUFFLATION 14GA 120MM (NEEDLE) IMPLANT
NDL SPNL 20GX3.5 QUINCKE YW (NEEDLE) IMPLANT
NEEDLE HYPO 21X1.5 SAFETY (NEEDLE) ×3 IMPLANT
NEEDLE INSUFFLATION 14GA 120MM (NEEDLE) IMPLANT
NEEDLE SPNL 20GX3.5 QUINCKE YW (NEEDLE) IMPLANT
OBTURATOR OPTICALSTD 8 DVNC (TROCAR) ×3 IMPLANT
PACK ROBOT GYN CUSTOM WL (TRAY / TRAY PROCEDURE) ×3 IMPLANT
PAD ARMBOARD POSITIONER FOAM (MISCELLANEOUS) ×3 IMPLANT
PAD POSITIONING PINK XL (MISCELLANEOUS) ×3 IMPLANT
PORT ACCESS TROCAR AIRSEAL 12 (TROCAR) ×1 IMPLANT
SCISSORS LAP 5X35 DISP (ENDOMECHANICALS) IMPLANT
SCISSORS LAP 5X45 EPIX DISP (ENDOMECHANICALS) IMPLANT
SCISSORS MNPLR CVD DVNC XI (INSTRUMENTS) ×3 IMPLANT
SCRUB CHG 4% DYNA-HEX 4OZ (MISCELLANEOUS) ×6 IMPLANT
SEAL UNIV 5-12 XI (MISCELLANEOUS) ×12 IMPLANT
SEALER TISSUE G2 CVD JAW 45CM (ENDOMECHANICALS) IMPLANT
SEALER VESSEL EXT DVNC XI (MISCELLANEOUS) ×1 IMPLANT
SET TRI-LUMEN FLTR TB AIRSEAL (TUBING) ×3 IMPLANT
SHEET LAVH (DRAPES) ×3 IMPLANT
SLEEVE Z-THREAD 5X100MM (TROCAR) ×3 IMPLANT
SPIKE FLUID TRANSFER (MISCELLANEOUS) ×3 IMPLANT
SPONGE T-LAP 18X18 ~~LOC~~+RFID (SPONGE) ×1 IMPLANT
SURGIFLO W/THROMBIN 8M KIT (HEMOSTASIS) ×1 IMPLANT
SUT MNCRL AB 4-0 PS2 18 (SUTURE) ×6 IMPLANT
SUT PDS AB 1 TP1 54 (SUTURE) ×2 IMPLANT
SUT VIC AB 0 CT1 27XBRD ANTBC (SUTURE) IMPLANT
SUT VIC AB 2-0 CT1 TAPERPNT 27 (SUTURE) ×1 IMPLANT
SUT VIC AB 4-0 PS2 18 (SUTURE) ×7 IMPLANT
SUT VICRYL 0 27 CT2 27 ABS (SUTURE) ×4 IMPLANT
SYR 10ML LL (SYRINGE) IMPLANT
SYSTEM BAG RETRIEVAL 10MM (BASKET) ×2 IMPLANT
SYSTEM RETRIEVL 5MM INZII UNIV (BASKET) IMPLANT
SYSTEM WOUND ALEXIS 18CM MED (MISCELLANEOUS) ×1 IMPLANT
TOWEL OR 17X26 10 PK STRL BLUE (TOWEL DISPOSABLE) ×3 IMPLANT
TRAP SPECIMEN MUCUS 40CC (MISCELLANEOUS) IMPLANT
TRAY FOLEY MTR SLVR 16FR STAT (SET/KITS/TRAYS/PACK) ×3 IMPLANT
TRAY LAPAROSCOPIC (CUSTOM PROCEDURE TRAY) ×3 IMPLANT
TROCAR ADV FIXATION 12X100MM (TROCAR) IMPLANT
TROCAR BALLN 12MMX100 BLUNT (TROCAR) IMPLANT
TROCAR PORT AIRSEAL 5X120 (TROCAR) IMPLANT
TROCAR XCEL NON-BLD 5MMX100MML (ENDOMECHANICALS) ×3 IMPLANT
TROCAR Z-THREAD OPTICAL 5X100M (TROCAR) ×3 IMPLANT
UNDERPAD 30X36 HEAVY ABSORB (UNDERPADS AND DIAPERS) ×6 IMPLANT
WATER STERILE IRR 1000ML POUR (IV SOLUTION) ×3 IMPLANT
YANKAUER SUCT BULB TIP 10FT TU (MISCELLANEOUS) ×1 IMPLANT

## 2024-09-14 NOTE — Transfer of Care (Signed)
 Immediate Anesthesia Transfer of Care Note  Patient: Nina Ramos  Procedure(s) Performed: ROBOTIC ASSISTED TOTAL LAPAROSCOPIC HYSTERECTOMY WITH BILATERAL SALPINGO-OOPHORECTOMY (Bilateral) LAPAROSCOPIC RIGHT PELVIC AND PARA-AORTIC LYMPHADENECTOMY, OMENTECTOMY (Right)  Patient Location: PACU  Anesthesia Type:General  Level of Consciousness: drowsy  Airway & Oxygen Therapy: Patient Spontanous Breathing and Patient connected to face mask oxygen  Post-op Assessment: Report given to RN and Post -op Vital signs reviewed and unstable, Anesthesiologist notified  Post vital signs: Reviewed and stable  Last Vitals:  Vitals Value Taken Time  BP 144/89 09/14/24 17:55  Temp    Pulse 75 09/14/24 17:59  Resp 5 09/14/24 17:59  SpO2 100 % 09/14/24 17:59  Vitals shown include unfiled device data.  Last Pain:  Vitals:   09/14/24 0950  TempSrc:   PainSc: 0-No pain         Complications: No notable events documented.

## 2024-09-14 NOTE — Anesthesia Procedure Notes (Signed)
 Procedure Name: Intubation Date/Time: 09/14/2024 11:20 AM  Performed by: Augusta Daved SAILOR, CRNAPre-anesthesia Checklist: Patient identified, Emergency Drugs available, Suction available and Patient being monitored Patient Re-evaluated:Patient Re-evaluated prior to induction Oxygen Delivery Method: Circle System Utilized Preoxygenation: Pre-oxygenation with 100% oxygen Induction Type: IV induction Ventilation: Mask ventilation without difficulty Laryngoscope Size: Miller and 2 Grade View: Grade I Tube type: Oral Tube size: 7.0 mm Number of attempts: 1 Airway Equipment and Method: Stylet and Oral airway Placement Confirmation: ETT inserted through vocal cords under direct vision, positive ETCO2 and breath sounds checked- equal and bilateral Secured at: 20 (at the lip) cm Tube secured with: Tape Dental Injury: Teeth and Oropharynx as per pre-operative assessment

## 2024-09-14 NOTE — Op Note (Signed)
 GYNECOLOGIC ONCOLOGY OPERATIVE NOTE  Date of Service: 09/14/2024  Preoperative Diagnosis: Pelvic mass, ascites  Postoperative Diagnosis: Malignant right ovarian mass  Procedures: Robotic-assisted total laparoscopic hysterectomy, bilateral salpingo-oophorectomy, right pelvic and para-aortic lymphadenectomy, omentectomy  Surgeon: Hoy Masters, MD  Assistants: Olam Mill, MD and (an MD assistant was necessary for tissue manipulation, management of robotic instrumentation, retraction and positioning due to the complexity of the case and hospital policies)  Anesthesia: General  Estimated Blood Loss: 100 mL    Fluids: 1200 ml, crystalloid  Urine Output: 400 ml, clear yellow  Findings: On entry to abdomen, normal upper abdominal survey with smooth diaphragm, liver, stomach and normal appearing omentum and bowel.  No evidence of carcinomatosis.  In the pelvis, 8 to 10 cm cystic and solid right ovarian mass.  Otherwise small normal-appearing uterus.  Normal left ovary and bilateral fallopian tubes.  Small volume ascites in the pelvis.  With minimal manipulation of mass, and incidental rupture occurred. Of note, washings/ascites collected after cyst rupture with attempt to avoid fluid in the upper abdomen where cyst fluid drained. IOFS c/w cancer, not further classified at this time.    Specimens:  ID Type Source Tests Collected by Time Destination  1 : uterus, cervix, bilateral tubes and ovaries (freeze right ovarian mass) Tissue PATH Gyn tumor resection SURGICAL PATHOLOGY Masters Hoy, MD 09/14/2024 1242   2 : right pelvic lymph nodes Tissue PATH Lymph node excision SURGICAL PATHOLOGY Masters Hoy, MD 09/14/2024 1315   3 : right peri aortic lymph node Tissue PATH Lymph node excision SURGICAL PATHOLOGY Masters Hoy, MD 09/14/2024 1446   4 : omentum Tissue PATH GI biopsy SURGICAL PATHOLOGY Masters Hoy, MD 09/14/2024 1539   A : pelvic washings Body Fluid PATH Cytology  Pelvic Washing CYTOLOGY - NON PAP Masters Hoy, MD 09/14/2024 1204     Complications:  None  Indications for Procedure: Nina Ramos is a 78 y.o. woman with a complex pelvic mass, ascites, and elevated ca125.  Prior to the procedure, all risks, benefits, and alternatives were discussed and informed surgical consent was signed.  Procedure: Patient was taken to the operating room where general anesthesia was achieved.  She was positioned in dorsal lithotomy and prepped and draped.  A foley catheter was inserted into the bladder.  The cervix was dilated and an Advincula uterine manipulator with a colpotomy ring was inserted into the uterus.  A 5 mm incision was made in the left upper quadrant near Palmer's point.  The abdomen was entered with a 5 mm OptiView trocar under direct visualization.  The abdomen was insufflated and inspect with the findings as noted above. Given no evidence of unresectable disease, decision was made to proceed with robotic surgery. The patient placed in steep Trendelenburg, and additional trocars were placed as follows: an 8mm trocar inferior to the umbilicus, two 8 mm robotic trocars in the right abdomen, and one 8 mm robotic trocar in the left abdomen.  The left upper quadrant trocar was removed and replaced with a 12 mm airseal trocar.  All trocars were placed under direct visualization.  The bowels were moved into the upper abdomen.  The DaVinci robotic surgical system was brought to the patient's bedside and docked.  With minimal manipulation of the mass, incidental cyst rupture occurred prior to pelvic washings. The posterior cul-de-sac was then accessed and small volume ascites was removed from the posterior cul-de-sac and washings from the anterior cul-de-sac for pelvic washings. The right round ligament was transected and the  retroperitoneum entered.  The right ureter was identified. The right infundibulopelvic ligament was isolated, cauterized, and transected. The  posterior peritoneum was opened to the KOH ring.  The anterior peritoneum was opened and the bladder flap was created.  The right uterine artery was skeletonized, cauterized, and transected at the level of the KOH ring. Additional cautery was used in a C-shaped fashion to allow the remainder of the broad, cardinal, and uterosacral ligaments with the uterine vessels to be transected and fall away from the KOH ring. A similar procedure was performed on the left side.  A colpotomy was made circumferentially following the contours of the KOH ring.  The uterine specimen along with the attached pelvic mass was placed in an Endocatch bag and removed through the vagina.    The vaginal cuff was closed with a running stitch of 0 Vicryl suture.  The pelvis was irrigated and all operative sites were found to be hemostatic. Following cuff closure, frozen pathology returned with cancer, unable to further characterize. Decision made to proceed with pelvic and para-aortic lymphadenectomy on the right.   The paravesical and pararectal spaces were opened on the right.  A right pelvic lymphadenectomy was performed using the following boundaries: the bifurcation of the common iliac vessels cephalad, the distal circumflex vein caudad, the genitofemoral nerve laterally, the superior vesical artery medially, and the obturator nerve at the floor of the dissection.  The specimen was placed to an Endo-Catch bag and stored in the upper abdomen for later retrieval.  The peritoneum overlying the right common iliac vessels was opened and extended cephalad following the aorta. The right ureter was identified and retracted up toward the anterior abdominal wall along with the right ascending colon.  A right para-aortic lymphadenectomy was performed from the common iliac vessels caudad to the insertion of the inferior mesenteric artery into the aorta cephalad.  The specimen was placed into an Endo-Catch bag and stored in the upper abdomen for  later retrieval.  At this time, the instruments were removed and the robotic boom was rotated to operate in the upper abdomen.  The robot was redocked and the instruments introduced. Attention was then turned to the omentum. An area superior to the transverse colon was incised and the lesser sac was entered.  Careful dissection was done to remove the omentum from the length of the transverse colon with electrocautery. Pedicles were made in the omentum just superior to the transverse colon. These were cauterized and transected with vessel sealer. Hemostasis was noted at all pedicles.  The omentum was then placed in an Endo Catch bag for later retrieval.  All instruments were removed and the robot was taken from the patient's bedside. The fascia at the 12 mm incision was closed with 0 Vicryl using a PMI device.  The infraumbilical robotic trocar was then removed and the incision at this location extended to approximately 2 cm.  The Endo Catch bags were then removed from this site for permanent pathology.  The abdomen was desufflated and all trocars were removed.  The fascia was closed with a running stitch of #1 PDS.  The subcutaneous layer was irrigated and made hemostatic with electrocautery.  Subcutaneous layer was reapproximated with 2-0 Vicryl. The skin at all incisions was closed with 4-0 Vicryl to reapproximate the subcutaneous tissue and 4-0 monocryl in a subcuticular fashion followed by surgical glue.  Patient tolerated the procedure well. Sponge, lap, and instrument counts were correct.  Patient received 2 gm of Ancef and 500  mg of metronidazole prior to skin incision for routine perioperative antibiotic prophylaxis.  She was extubated and taken to the PACU in stable condition.  Hoy Masters, MD Gynecologic Oncology

## 2024-09-14 NOTE — Discharge Instructions (Addendum)
 AFTER SURGERY INSTRUCTIONS   Return to work: 4-6 weeks if applicable   Activity: 1. Be up and out of the bed during the day.  Take a nap if needed.  You may walk up steps but be careful and use the hand rail.  Stair climbing will tire you more than you think, you may need to stop part way and rest.    2. No lifting or straining for 6 weeks over 10 pounds. No pushing, pulling, straining for 6 weeks.   3. No driving for 4-89 days when the following criteria have been met: Do not drive if you are taking narcotic pain medicine and make sure that your reaction time has returned.    4. You can shower as soon as the next day after surgery. Shower daily.  Use your regular soap and water (not directly on the incision) and pat your incision(s) dry afterwards; don't rub.  No tub baths or submerging your body in water until cleared by your surgeon. If you have the soap that was given to you by pre-surgical testing that was used before surgery, you do not need to use it afterwards because this can irritate your incisions.    5. No sexual activity and nothing in the vagina for 12 weeks.   6. You may experience a small amount of clear drainage from your incisions, which is normal.  If the drainage persists, increases, or changes color please call the office.   7. Do not use creams, lotions, or ointments such as neosporin on your incisions after surgery until advised by your surgeon because they can cause removal of the dermabond glue on your incisions.     8. You may experience vaginal spotting after surgery or when the stitches at the top of the vagina begin to dissolve.  The spotting is normal but if you experience heavy bleeding, call our office.   9. Take Tylenol  or ibuprofen first for pain if you are able to take these medications and only use narcotic pain medication for severe pain not relieved by the Tylenol  or Ibuprofen.  Monitor your Tylenol  intake to a max of 4,000 mg in a 24 hour period. You can  alternate these medications after surgery.   Diet: 1. Low sodium Heart Healthy Diet is recommended but you are cleared to resume your normal (before surgery) diet after your procedure.   2. It is safe to use a laxative, such as Miralax  or Colace, if you have difficulty moving your bowels before surgery. You have been prescribed Sennakot-S to take at bedtime every evening after surgery to keep bowel movements regular and to prevent constipation.    3. Start the Eliquis tomorrow evening. You are to continue this medication for 14 days to reduce you risk of developing a blood clot in your legs or lungs.    Wound Care: 1. Keep clean and dry.  Shower daily.   Reasons to call the Doctor: Fever - Oral temperature greater than 100.4 degrees Fahrenheit Foul-smelling vaginal discharge Difficulty urinating Nausea and vomiting Increased pain at the site of the incision that is unrelieved with pain medicine. Difficulty breathing with or without chest pain New calf pain especially if only on one side Sudden, continuing increased vaginal bleeding with or without clots.   Contacts: For questions or concerns you should contact:   Dr. Hoy Masters at [phone]   Eleanor Epps, NP at [phone]   After Hours: call [phone] and have the GYN Oncologist paged/contacted (after 5 pm  or on the weekends). You will speak with an after hours RN and let he or she know you have had surgery.   Messages sent via mychart are for non-urgent matters and are not responded to after hours so for urgent needs, please call the after hours number.

## 2024-09-14 NOTE — Interval H&P Note (Signed)
 History and Physical Interval Note:  09/14/2024 10:49 AM  Nina Ramos  has presented today for surgery, with the diagnosis of Pelvic mass Other ascites.  The various methods of treatment have been discussed with the patient and family. After consideration of risks, benefits and other options for treatment, the patient has consented to  Procedure(s) with comments: LAPAROSCOPY, DIAGNOSTIC (N/A) HYSTERECTOMY, TOTAL, ROBOT-ASSISTED, LAPAROSCOPIC, WITH BILATERAL SALPINGO-OOPHORECTOMY (Bilateral) - POSSIBLE STAGING, POSSIBLE LAPAROTOMY as a surgical intervention.  The patient's history has been reviewed, patient examined, no change in status, stable for surgery.  I have reviewed the patient's chart and labs.  Questions were answered to the patient's satisfaction.     [name]

## 2024-09-14 NOTE — Anesthesia Postprocedure Evaluation (Signed)
 Anesthesia Post Note  Patient: Nina Ramos  Procedure(s) Performed: ROBOTIC ASSISTED TOTAL LAPAROSCOPIC HYSTERECTOMY WITH BILATERAL SALPINGO-OOPHORECTOMY (Bilateral) LAPAROSCOPIC RIGHT PELVIC AND PARA-AORTIC LYMPHADENECTOMY, OMENTECTOMY (Right)     Patient location during evaluation: PACU Anesthesia Type: General Level of consciousness: awake and alert Pain management: pain level controlled Vital Signs Assessment: post-procedure vital signs reviewed and stable Respiratory status: spontaneous breathing, nonlabored ventilation and respiratory function stable Cardiovascular status: blood pressure returned to baseline Postop Assessment: no apparent nausea or vomiting Comments: Naloxone given at end of case and shortly after arrival to PACU (40mcg x2 IV). Patient remained sleepy but gradually responsive to verbal stimuli and able to answer questions at time of PACU discharge. Pain well controlled. RR low teens. SpO2 >97% on RA throughout PACU stay. Plan for admission for overnight monitoring. Lawence, MD   No notable events documented.  Last Vitals:  Vitals:   09/14/24 1930 09/14/24 1945  BP: (!) 123/56 117/73  Pulse: 76 66  Resp: (!) 8 11  Temp: (!) 36 C   SpO2: 92% 96%    Last Pain:  Vitals:   09/14/24 1945  TempSrc:   PainSc: 0-No pain                 Vertell Row

## 2024-09-14 NOTE — Anesthesia Preprocedure Evaluation (Addendum)
 Anesthesia Evaluation  Patient identified by MRN, date of birth, ID band Patient awake    Reviewed: Allergy & Precautions, NPO status , Patient's Chart, lab work & pertinent test results  History of Anesthesia Complications Negative for: history of anesthetic complications  Airway Mallampati: I  TM Distance: >3 FB Neck ROM: Full    Dental no notable dental hx.    Pulmonary former smoker   Pulmonary exam normal        Cardiovascular hypertension, Normal cardiovascular exam     Neuro/Psych   Anxiety     negative neurological ROS     GI/Hepatic negative GI ROS, Neg liver ROS,,,  Endo/Other  negative endocrine ROS    Renal/GU Cr 1.02     Musculoskeletal negative musculoskeletal ROS (+)    Abdominal   Peds  Hematology negative hematology ROS (+)   Anesthesia Other Findings Pelvic mass  Reproductive/Obstetrics                              Anesthesia Physical Anesthesia Plan  ASA: 2  Anesthesia Plan: General   Post-op Pain Management: Tylenol  PO (pre-op)*   Induction: Intravenous  PONV Risk Score and Plan: 3 and Treatment may vary due to age or medical condition, Ondansetron , Dexamethasone, TIVA and Propofol infusion  Airway Management Planned: Oral ETT  Additional Equipment: None  Intra-op Plan:   Post-operative Plan: Extubation in OR  Informed Consent: I have reviewed the patients History and Physical, chart, labs and discussed the procedure including the risks, benefits and alternatives for the proposed anesthesia with the patient or authorized representative who has indicated his/her understanding and acceptance.     Dental advisory given  Plan Discussed with: CRNA  Anesthesia Plan Comments:          Anesthesia Quick Evaluation

## 2024-09-15 ENCOUNTER — Other Ambulatory Visit: Payer: Self-pay

## 2024-09-15 ENCOUNTER — Encounter (HOSPITAL_COMMUNITY): Payer: Self-pay | Admitting: Psychiatry

## 2024-09-15 DIAGNOSIS — C561 Malignant neoplasm of right ovary: Secondary | ICD-10-CM | POA: Diagnosis not present

## 2024-09-15 LAB — HEMOGLOBIN AND HEMATOCRIT, BLOOD
HCT: 35.4 % — ABNORMAL LOW (ref 36.0–46.0)
Hemoglobin: 11.2 g/dL — ABNORMAL LOW (ref 12.0–15.0)

## 2024-09-15 LAB — CBC
HCT: 38.4 % (ref 36.0–46.0)
Hemoglobin: 12.7 g/dL (ref 12.0–15.0)
MCH: 31.9 pg (ref 26.0–34.0)
MCHC: 33.1 g/dL (ref 30.0–36.0)
MCV: 96.5 fL (ref 80.0–100.0)
Platelets: 154 K/uL (ref 150–400)
RBC: 3.98 MIL/uL (ref 3.87–5.11)
RDW: 13.8 % (ref 11.5–15.5)
WBC: 8.7 K/uL (ref 4.0–10.5)
nRBC: 0 % (ref 0.0–0.2)

## 2024-09-15 LAB — BASIC METABOLIC PANEL WITH GFR
Anion gap: 7 (ref 5–15)
BUN: 15 mg/dL (ref 8–23)
CO2: 27 mmol/L (ref 22–32)
Calcium: 8.3 mg/dL — ABNORMAL LOW (ref 8.9–10.3)
Chloride: 102 mmol/L (ref 98–111)
Creatinine, Ser: 0.76 mg/dL (ref 0.44–1.00)
GFR, Estimated: >60 mL/min (ref >60)
Glucose, Bld: 146 mg/dL — ABNORMAL HIGH (ref 70–99)
Potassium: 4.9 mmol/L (ref 3.5–5.1)
Sodium: 135 mmol/L (ref 135–145)

## 2024-09-15 MED ORDER — SODIUM CHLORIDE 0.9 % IV BOLUS
250.0000 mL | Freq: Once | INTRAVENOUS | Status: AC
  Administered 2024-09-15: 250 mL via INTRAVENOUS

## 2024-09-15 MED ORDER — ENSURE PLUS HIGH PROTEIN PO LIQD
237.0000 mL | Freq: Once | ORAL | Status: AC
  Administered 2024-09-15: 237 mL via ORAL

## 2024-09-15 NOTE — Plan of Care (Signed)
  Problem: Education: Goal: Knowledge of General Education information will improve Description: Including pain rating scale, medication(s)/side effects and non-pharmacologic comfort measures Outcome: Adequate for Discharge   Problem: Health Behavior/Discharge Planning: Goal: Ability to manage health-related needs will improve Outcome: Adequate for Discharge   Problem: Clinical Measurements: Goal: Ability to maintain clinical measurements within normal limits will improve 09/15/2024 1743 by Feliberto Graces, RN Outcome: Adequate for Discharge 09/15/2024 1539 by Feliberto Graces, RN Outcome: Progressing Goal: Will remain free from infection 09/15/2024 1743 by Feliberto Graces, RN Outcome: Adequate for Discharge 09/15/2024 1539 by Feliberto Graces, RN Outcome: Progressing Goal: Diagnostic test results will improve 09/15/2024 1743 by Feliberto Graces, RN Outcome: Adequate for Discharge 09/15/2024 1539 by Feliberto Graces, RN Outcome: Progressing Goal: Respiratory complications will improve Outcome: Adequate for Discharge Goal: Cardiovascular complication will be avoided Outcome: Adequate for Discharge

## 2024-09-15 NOTE — Progress Notes (Signed)
 Patient ID: Nina Ramos, female   DOB: Jan 29, 1946, 78 y.o.   MRN: [id number]  1 Day Post-Op Procedure(s) (LRB): ROBOTIC ASSISTED TOTAL LAPAROSCOPIC HYSTERECTOMY WITH BILATERAL SALPINGO-OOPHORECTOMY (Bilateral) LAPAROSCOPIC RIGHT PELVIC AND PARA-AORTIC LYMPHADENECTOMY, OMENTECTOMY (Right)  Subjective: Patient reports nausea and no problems voiding.  Denies vomiting.  Abdominal discomfort with movement.  Objective: Vital signs in last 24 hours: Temp:  [95.9 F (35.5 C)-98 F (36.7 C)] 98 F (36.7 C) (10/29 1400) Pulse Rate:  [57-92] 76 (10/29 1523) Resp:  [5-18] 18 (10/29 1400) BP: (115-148)/(37-89) 115/41 (10/29 1523) SpO2:  [92 %-100 %] 100 % (10/29 1400) Last BM Date : 09/11/24  Intake/Output from previous day: 10/28 0701 - 10/29 0700 In: 3101.6 [I.V.:2801.6; IV Piggyback:300] Out: 500 [Urine:400; Blood:100]  Physical Examination:   Labs: WBC/Hgb/Hct/Plts:  8.7/12.7/38.4/154 (10/29 9476) BUN/Cr/glu/ALT/AST/amyl/lip:  15/0.76/--/--/--/--/-- (10/29 9476)   Assessment:  78 y.o. s/p Procedure(s): ROBOTIC ASSISTED TOTAL LAPAROSCOPIC HYSTERECTOMY WITH BILATERAL SALPINGO-OOPHORECTOMY LAPAROSCOPIC RIGHT PELVIC AND PARA-AORTIC LYMPHADENECTOMY, OMENTECTOMY: stable Pain:  Pain is well-controlled on oral medications.  GI:  Nausea/no emesis.  Minimal fluid intake  FEN: No significant metabolic derangements   Prophylaxis: pharmacologic prophylaxis (with any of the following: enoxaparin  (Lovenox ) 40mg  SQ 2 hours prior to surgery then every day) and intermittent pneumatic compression boots.  Plan: Encourage ambulation IVF bolus now  The patient is to be discharged to home later today when tolerating po.     LOS: 0 days    Olam Mill, MD 09/15/2024, 4:42 PM

## 2024-09-15 NOTE — Plan of Care (Signed)
 ?  Problem: Clinical Measurements: ?Goal: Ability to maintain clinical measurements within normal limits will improve ?Outcome: Progressing ?Goal: Will remain free from infection ?Outcome: Progressing ?Goal: Diagnostic test results will improve ?Outcome: Progressing ?  ?

## 2024-09-15 NOTE — Progress Notes (Signed)
 Mobility Specialist Progress Note:   09/15/24 1356  Mobility  Activity Ambulated with assistance  Level of Assistance Minimal assist, patient does 75% or more  Assistive Device Front wheel walker  Distance Ambulated (ft) 70 ft  Activity Response Tolerated well  Mobility Referral Yes  Mobility visit 1 Mobility  Mobility Specialist Start Time (ACUTE ONLY) 1337  Mobility Specialist Stop Time (ACUTE ONLY) 1352  Mobility Specialist Time Calculation (min) (ACUTE ONLY) 15 min   Pt was received in bed and agreed to mobility. Min A sit to stand. 2x small standing break during ambulation. Returned to bed with all needs met. Call bell in reach. Left in room with family.  [employer] - Mobility Specialist

## 2024-09-15 NOTE — Plan of Care (Signed)

## 2024-09-15 NOTE — Progress Notes (Signed)
 Pt was given DC orders and all questions were answered. Pt is dressed and waiting on her daughters to pick her up.

## 2024-09-15 NOTE — Care Management Obs Status (Signed)
 MEDICARE OBSERVATION STATUS NOTIFICATION   Patient Details  Name: Nina Ramos MRN: [id number] Date of Birth: February 06, 1946   Medicare Observation Status Notification Given:  Yes    Alfonse JONELLE Rex, RN 09/15/2024, 1:56 PM

## 2024-09-15 NOTE — Discharge Summary (Signed)
 Physician Discharge Summary  Patient ID: Nina Ramos MRN: [id number] DOB/AGE: [birth year] 78 y.o.  Admit date: 09/14/2024 Discharge date: 09/15/2024  Admission Diagnoses: Adnexal mass  Discharge Diagnoses:  Principal Problem:   Adnexal mass   Discharged Condition: good  Hospital Course: On 09/14/2024, the patient underwent the following: Procedure(s): ROBOTIC ASSISTED TOTAL LAPAROSCOPIC HYSTERECTOMY WITH BILATERAL SALPINGO-OOPHORECTOMY LAPAROSCOPIC RIGHT PELVIC AND PARA-AORTIC LYMPHADENECTOMY, OMENTECTOMY.   During the immediate postoperative course she required naloxone for unresponsiveness/somnolence. She was admitted overnight for further monitoring.  On POD#1 she complained of nausea but this gradually resolved and was able to tolerate a regular diet.  Her B/Ps were downward trending but she remained asymptomatic, hemodynamically stable.  She was volume resuscitated with fluid boluses.  She was felt to be meeting all postoperative goals at the time of discharge.  Consults: None  Significant Diagnostic Studies: None  Treatments: surgery: see above  Discharge Exam: Blood pressure (!) 116/45, pulse 88, temperature 97.9 F (36.6 C), temperature source Oral, resp. rate 18, weight 56 kg, SpO2 100%. General appearance: alert Cardio: regular rate and rhythm, S1, S2 normal, no murmur, click, rub or gallop GI: soft, non-tender; bowel sounds normal; no masses,  no organomegaly Extremities: extremities normal, atraumatic, no cyanosis or edema Incision/Wound: C/D/I--small ecchymoses  Disposition: Discharge disposition: 01-Home or Self Care       Discharge Instructions     Call MD for:  difficulty breathing, headache or visual disturbances   Complete by: As directed    Call MD for:  extreme fatigue   Complete by: As directed    Call MD for:  persistant dizziness or light-headedness   Complete by: As directed    Call MD for:  persistant nausea and vomiting   Complete by: As  directed    Call MD for:  redness, tenderness, or signs of infection (pain, swelling, redness, odor or green/yellow discharge around incision site)   Complete by: As directed    Call MD for:  severe uncontrolled pain   Complete by: As directed    Call MD for:  temperature >100.4   Complete by: As directed    Diet - low sodium heart healthy   Complete by: As directed       Allergies as of 09/15/2024       Reactions   Morphine  Nausea And Vomiting   Severe N/V   Oxycodone  Nausea And Vomiting   Severe N/V        Medication List     STOP taking these medications    cephALEXin  500 MG capsule Commonly known as: KEFLEX        TAKE these medications    acetaminophen  325 MG tablet Commonly known as: TYLENOL  Take 650 mg by mouth every 6 (six) hours as needed for moderate pain (pain score 4-6) or headache.   alendronate  70 MG tablet Commonly known as: FOSAMAX  Take 70 mg by mouth once a week. Take with a full glass of water on an empty stomach.   apixaban 2.5 MG Tabs tablet Commonly known as: ELIQUIS Take 1 tablet (2.5 mg total) by mouth 2 (two) times daily for 14 days.   gabapentin  100 MG capsule Commonly known as: NEURONTIN  TAKE 1 CAPSULE BY MOUTH AT  BEDTIME   senna-docusate 8.6-50 MG tablet Commonly known as: Senokot-S Take 2 tablets by mouth at bedtime. For AFTER surgery, do not take if having diarrhea   traMADol  50 MG tablet Commonly known as: ULTRAM  Take 1 tablet (50 mg total) by mouth  every 6 (six) hours as needed for moderate pain (pain score 4-6). For AFTER surgery only, do not take and drive        Follow-up Information     Eldonna Mays, MD Follow up on 09/27/2024.   Specialty: Gynecologic Oncology Why: at 2:45pm at the [facility] information: [address] [place] [address] [phone]                 Signed: Olam Mill, MD 09/15/2024, 8:35 PM

## 2024-09-15 NOTE — TOC Initial Note (Signed)
 Transition of Care [facility]) - Initial/Assessment Note    Patient Details  Name: Nina Ramos MRN: [id number] Date of Birth: 04-18-1946  Transition of Care [facility]) CM/SW Contact:    Alfonse JONELLE Rex, RN Phone Number: 09/15/2024, 2:02 PM  Clinical Narrative:  Met with patient and her daughters at bedside to introduce role of TOC/NCM and review for dc planning, patient/daughters confirm patient has an established PCP, no current home care services or home DME. Patient reports she feels safe returning home with support from her family. MOON completed, patient gave verbal permission for her daughter to [place] form. No further TOC needs anticipated at this time.                  Expected Discharge Plan: Home/Self Care Barriers to Discharge: Continued Medical Work up   Patient Goals and CMS Choice Patient states their goals for this hospitalization and ongoing recovery are:: return home          Expected Discharge Plan and Services       Living arrangements for the past 2 months: Single Family Home Expected Discharge Date: 09/14/24                                    Prior Living Arrangements/Services Living arrangements for the past 2 months: Single Family Home Lives with:: Self Patient language and need for interpreter reviewed:: Yes Do you feel safe going back to the place where you live?: Yes      Need for Family Participation in Patient Care: Yes (Comment) Care giver support system in place?: Yes (comment)   Criminal Activity/Legal Involvement Pertinent to Current Situation/Hospitalization: No - Comment as needed  Activities of Daily Living   ADL Screening (condition at time of admission) Independently performs ADLs?: Yes (appropriate for developmental age) Is the patient deaf or have difficulty hearing?: No Does the patient have difficulty seeing, even when wearing glasses/contacts?: No Does the patient have difficulty concentrating, remembering, or making  decisions?: No  Permission Sought/Granted                  Emotional Assessment Appearance:: Appears stated age Attitude/Demeanor/Rapport: Engaged Affect (typically observed): Accepting Orientation: : Oriented to Self, Oriented to Place, Oriented to  Time, Oriented to Situation Alcohol / Substance Use: Not Applicable Psych Involvement: No (comment)  Admission diagnosis:  Pelvic mass [R19.00] Other ascites [R18.8] Adnexal mass [N94.89] Patient Active Problem List   Diagnosis Date Noted   AKI (acute kidney injury) 08/07/2024   Erythrocytosis 08/07/2024   Adnexal mass 08/07/2024   Intractable abdominal pain 08/06/2024   Hyperlipidemia 11/30/2019   Neuropathy 10/19/2018   HTN (hypertension) 10/06/2018   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 - [address] [place] [address] Phone: [phone] Fax: [phone]  [facility] - [place], [place] - [address] [place] [address] Phone: [phone] Fax: [phone]     Social Drivers of Health (SDOH) Social History: SDOH Screenings   Food Insecurity: No Food Insecurity (09/15/2024)  Housing: Low Risk  (09/15/2024)  Transportation Needs: No Transportation Needs (09/15/2024)  Utilities: Not  At Risk (09/15/2024)  Alcohol Screen: Low Risk  (05/29/2023)  Depression (PHQ2-9): Low Risk  (05/29/2023)  Financial Resource Strain: Low Risk  (05/29/2023)  Physical Activity: Sufficiently Active (05/29/2023)  Social Connections: Moderately Integrated (09/15/2024)  Stress: No Stress Concern Present (05/29/2023)  Tobacco Use: Medium Risk (09/14/2024)  Health Literacy: Adequate Health Literacy (05/29/2023)   SDOH  Interventions:     Readmission Risk Interventions     No data to display

## 2024-09-16 ENCOUNTER — Telehealth: Payer: Self-pay | Admitting: *Deleted

## 2024-09-16 ENCOUNTER — Other Ambulatory Visit (HOSPITAL_COMMUNITY): Payer: Self-pay

## 2024-09-16 NOTE — Telephone Encounter (Signed)
 Not[facility] with [facility] [place] and they state patient has medicare and unable to request a co-pay card for Apixaban and a 14 day supply of the 2.5 mg dose will cost $282.  [facility] was called and spoke with [facility] who states she can send [facility] a free trial voucher card that patient can use 1 time only (in lifetime).  Monica called the office back and states the trial voucher went through and patient will not be charged for the Apixaban and Kim the pharmacist from [facility] will call the patient to let her know.

## 2024-09-16 NOTE — Telephone Encounter (Signed)
 Spoke with Nina Ramos and patient states that her and her husband were on the way to pick up her Rx for Apixaban. Pt thanked the office. Advised patient to take a dose of the 2.5 mg tonight and then start in the morning with a 2.5 mg dose in the am and then again in the evening for 14 days. Pt verbalized understanding and thanked the office again.

## 2024-09-16 NOTE — Telephone Encounter (Signed)
 Spoke with Nina Ramos this morning. She states she is eating, drinking and urinating well. She has not had a BM yet but is passing gas. She is taking senokot as prescribed and encouraged her to drink plenty of water. She denies fever or chills. Incisions are dry and intact. She rates her pain 5/10. Her pain is controlled with tramadol . Pt states she didn't pick up the Rx for Apixaban because it was to expensive. Advised patient that the [facility] and we will call her right back.     Instructed to call office with any fever, chills, purulent drainage, uncontrolled pain or any other questions or concerns. Patient verbalizes understanding.   Pt aware of post op appointments as well as the office number [phone] and after hours number [phone] to call if she has any questions or concerns

## 2024-09-20 LAB — CYTOLOGY - NON PAP

## 2024-09-21 ENCOUNTER — Telehealth: Payer: Self-pay | Admitting: Psychiatry

## 2024-09-21 NOTE — Telephone Encounter (Signed)
 Called pt's with pathology results. Discussed in brief adjuvant chemo. We will discuss in more detail at her postop visit. Recovering well. Keep follow-up as scheduled.

## 2024-09-22 ENCOUNTER — Ambulatory Visit

## 2024-09-22 VITALS — Ht 65.0 in | Wt 123.0 lb

## 2024-09-22 DIAGNOSIS — Z0000 Encounter for general adult medical examination without abnormal findings: Secondary | ICD-10-CM

## 2024-09-22 NOTE — Progress Notes (Signed)
 Subjective:   Nina Ramos is a 78 y.o. female who presents for a Medicare Annual Wellness Visit.Visit Complete: Virtual I connected with this patient by a audio enabled telemedicine application and verified that I am speaking with the correct person using two identifiers.  Patient Location: Home Provider Location: Home Office  I discussed the limitations of evaluation and management by telemedicine. The patient expressed understanding and agreed to proceed.  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/2018   Neuromuscular disorder (HCC)    neuropathy feet and legs   Neuropathy 10/19/2018   Osteoporosis    Pneumonia    Past Surgical History:  Procedure Laterality Date   LAPAROSCOPIC TOTAL PELVIC LYMPHADENECTOMY Right 09/14/2024   Procedure: LAPAROSCOPIC RIGHT PELVIC AND PARA-AORTIC LYMPHADENECTOMY, OMENTECTOMY;  Surgeon: Eldonna Mays, MD;  Location: [facility];  Service: Gynecology;  Laterality: Right;   NO PAST SURGERIES     ROBOTIC ASSISTED TOTAL HYSTERECTOMY WITH BILATERAL SALPINGO OOPHERECTOMY Bilateral 09/14/2024   Procedure: ROBOTIC ASSISTED TOTAL LAPAROSCOPIC HYSTERECTOMY WITH BILATERAL SALPINGO-OOPHORECTOMY;  Surgeon: Eldonna Mays, MD;  Location: [facility];  Service: Gynecology;  Laterality: Bilateral;  POSSIBLE STAGING, POSSIBLE LAPAROTOMY   Family History  Problem Relation Age of Onset   Asthma Mother    Heart disease Father    Breast cancer Paternal Aunt    Ovarian cancer Neg Hx    Colon cancer Neg Hx    Endometrial cancer Neg Hx    Social History   Occupational History   Not on file  Tobacco Use   Smoking status: Former    Passive exposure: Never   Smokeless tobacco:  Former    Quit date: 02/05/1994   Tobacco comments:    78 years old to 45 or 7 yr. old  Vaping Use   Vaping status: Never Used  Substance and Sexual Activity   Alcohol use: Never   Drug use: Never   Sexual activity: Not Currently   Tobacco Counseling Counseling given: Not Answered Tobacco comments: 78 years old to 37 or 25 yr. old  SDOH Screenings   Food Insecurity: No Food Insecurity (09/22/2024)  Housing: Unknown (09/22/2024)  Transportation Needs: No Transportation Needs (09/22/2024)  Utilities: Not At Risk (09/22/2024)  Alcohol Screen: Low Risk  (05/29/2023)  Depression (PHQ2-9): Low Risk  (09/22/2024)  Financial Resource Strain: Low Risk  (05/29/2023)  Physical Activity: Inactive (09/22/2024)  Social Connections: Moderately Integrated (09/22/2024)  Stress: No Stress Concern Present (09/22/2024)  Tobacco Use: Medium Risk (09/22/2024)  Health Literacy: Adequate Health Literacy (09/22/2024)   Depression Screen    09/22/2024   11:26 AM 05/29/2023    3:32 PM 05/16/2022    3:12 PM 10/05/2018    3:39 PM 04/18/2016    2:07 PM 07/07/2014    3:19 PM  PHQ 2/9 Scores  PHQ - 2 Score 0 0 0 0 0 0  PHQ- 9 Score     0      Goals Addressed             This Visit's Progress    Exercise 3x per week (30 min per time)   Not on track    Continue to stay active and healthy.  Get well through this surgery and healing from surgery       COMPLETED: Remain active and independent   On track      Visit info / Clinical Intake: Medicare Wellness Visit Type:: Subsequent Annual Wellness Visit Medicare Wellness Visit Mode:: Telephone If telephone:: video declined If telephone or video:: unable to obtan vitals due to lack of equipment Interpreter Needed?: No Pre-visit prep was completed: no AWV questionnaire completed by patient prior to visit?: no Living arrangements:: lives with spouse/significant other Patient's Overall Health Status Rating: good Typical amount of pain: none Does pain  affect daily life?: (!) yes (from surgery:pt is taking Advil) Are you currently prescribed opioids?: (!) yes (pt says she is not taking)  Dietary Habits and Nutritional Risks How many meals a day?: 2 Eats fruit and vegetables daily?: yes (sometimes) Most meals are obtained by: preparing own meals In the last 2 weeks, have you had any of the following?: -- (none) Diabetic:: no  Functional Status Activities of Daily Living (to include ambulation/medication): (!) Needs Assist (post op status) Feeding: Independent Dressing/Grooming: Independent Bathing: Independent Toileting: Independent Transfer: Independent Ambulation: Independent Medication Administration: Needs assistance (comment) Is this a change from baseline?: Change from baseline, expected to last >3 days Home Management: Needs assistance (comment) Manage your own finances?: yes Primary transportation is: family/friends (post-op status) Concerns about vision?: no *vision screening is required for WTM* Concerns about hearing?: (!) yes (will get referral after pt finishing recouping) Uses hearing aids?: no Hear whispered voice?: (!) no *in-person visit only*  Fall Screening Falls in the past year?: 0 Number of falls in past year: 0 Was there an injury with Fall?: 0 Fall Risk Category Calculator: 0 Patient Fall Risk Level: Low Fall Risk  Fall Risk Patient at Risk for Falls Due to: Other (Comment) (pt is post -op now but normaling no fall risk) Fall risk Follow up: Education provided; Falls prevention discussed  Home and Transportation Safety: All rugs have non-skid backing?: yes All stairs or steps have railings?: N/A, no stairs Grab bars in the bathtub or shower?: yes Have non-skid surface in bathtub or shower?: yes Good home lighting?: yes Regular seat belt use?: yes Hospital stays in the last year:: (!) yes How many hospital stays:: 1 Reason: pelvic surgery (total hysterectomy?  Cognitive Assessment Difficulty  concentrating, remembering, or making decisions? : yes Will 6CIT or Mini Cog be Completed: yes What year is it?: 0 points What month is it?: 0 points Give patient an address phrase to remember (5 components): [address] [place]  About what time is it?: 0 points Count backwards from 20 to 1: 0 points Say the months of the year in reverse: 2 points Repeat the address phrase from earlier: 0 points 6 CIT Score: 2 points  Advance Directives (For Healthcare) Does Patient Have a Medical Advance Directive?: No Would patient like information on creating a medical advance directive?: No - Patient declined  Reviewed/Updated  Reviewed/Updated: All        Objective:    Today's Vitals   09/22/24 1057  Weight: 123 lb (55.8 kg)  Height: 5' 5 (1.651 m)   Body mass index is 20.47 kg/m.  Current Medications (verified) Outpatient Encounter Medications as of 09/22/2024  Medication Sig   acetaminophen  (TYLENOL ) 325 MG tablet Take 650 mg by mouth every 6 (six) hours as needed for moderate pain (pain score 4-6) or headache.   alendronate  (FOSAMAX ) 70 MG tablet Take 70 mg by mouth once a week.  Take with a full glass of water on an empty stomach.   apixaban (ELIQUIS) 2.5 MG TABS tablet Take 1 tablet (2.5 mg total) by mouth 2 (two) times daily for 14 days.   senna-docusate (SENOKOT-S) 8.6-50 MG tablet Take 2 tablets by mouth at bedtime. For AFTER surgery, do not take if having diarrhea   gabapentin  (NEURONTIN ) 100 MG capsule TAKE 1 CAPSULE BY MOUTH AT  BEDTIME (Patient not taking: Reported on 09/22/2024)   traMADol  (ULTRAM ) 50 MG tablet Take 1 tablet (50 mg total) by mouth every 6 (six) hours as needed for moderate pain (pain score 4-6). For AFTER surgery only, do not take and drive (Patient not taking: Reported on 09/22/2024)   No facility-administered encounter medications on file as of 09/22/2024.   Hearing/Vision screen Vision Screening - Comments:: Not UTD w/visits due to recent cancer  ik:tpoo schedule when gets through this acute phase [facility] Immunizations and Health Maintenance Health Maintenance  Topic Date Due   Medicare Annual Wellness (AWV)  05/28/2024   COVID-19 Vaccine (1 - 2025-26 season) Never done   Zoster Vaccines- Shingrix (1 of 2) 11/12/2024 (Originally 08/09/1995)   DTaP/Tdap/Td (1 - Tdap) 08/13/2025 (Originally 08/08/1964)   Pneumococcal Vaccine: 50+ Years  Completed   Influenza Vaccine  Completed   DEXA SCAN  Completed   Hepatitis C Screening  Completed   Meningococcal B Vaccine  Aged Out   Mammogram  Discontinued        Assessment/Plan:  This is a routine wellness examination for [place].  Patient Care Team: Duanne Butler DASEN, MD as PCP - General (Family Medicine) [facility] [place]  I have personally reviewed and noted the following in the patient's chart:   Medical and social history Use of alcohol, tobacco or illicit drugs  Current medications and supplements including opioid prescriptions. Functional ability and status Nutritional status Physical activity Advanced directives List of other physicians Hospitalizations, surgeries, and ER visits in previous 12 months Vitals Screenings to include cognitive, depression, and falls Referrals and appointments  No orders of the defined types were placed in this encounter.  In addition, I have reviewed and discussed with patient certain preventive protocols, quality metrics, and best practice recommendations. A written personalized care plan for preventive services as well as general preventive health recommendations were provided to patient.   Erminio LITTIE Saris, LPN   [id number]/[id number]   No follow-ups on file.  After Visit Summary: (MyChart) Due to this being a telephonic visit, the after visit summary with patients personalized plan was offered to patient via MyChart   Nurse Notes: Pt has no concerns or questions. AWV made for one year

## 2024-09-22 NOTE — Patient Instructions (Signed)
 [name],  Thank you for taking the time for your Medicare Wellness Visit. I appreciate your continued commitment to your health goals. Please review the care plan we discussed, and feel free to reach out if I can assist you further.  Please note that Annual Wellness Visits do not include a physical exam. Some assessments may be limited, especially if the visit was conducted virtually. If needed, we may recommend an in-person follow-up with your provider.  Ongoing Care Seeing your primary care provider every 3 to 6 months helps us  monitor your health and provide consistent, personalized care.   Referrals If a referral was made during today's visit and you haven't received any updates within two weeks, please contact the referred provider directly to check on the status.  Recommended Screenings:  Health Maintenance  Topic Date Due   Medicare Annual Wellness Visit  05/28/2024   COVID-19 Vaccine (1 - 2025-26 season) Never done   Zoster (Shingles) Vaccine (1 of 2) 11/12/2024*   DTaP/Tdap/Td vaccine (1 - Tdap) 08/13/2025*   Pneumococcal Vaccine for age over 89  Completed   Flu Shot  Completed   DEXA scan (bone density measurement)  Completed   Hepatitis C Screening  Completed   Meningitis B Vaccine  Aged Out   Breast Cancer Screening  Discontinued  *Topic was postponed. The date shown is not the original due date.       09/22/2024   11:10 AM  Advanced Directives  Does Patient Have a Medical Advance Directive? No    Vision: Annual vision screenings are recommended for early detection of glaucoma, cataracts, and diabetic retinopathy. These exams can also reveal signs of chronic conditions such as diabetes and high blood pressure.  Dental: Annual dental screenings help detect early signs of oral cancer, gum disease, and other conditions linked to overall health, including heart disease and diabetes.

## 2024-09-27 ENCOUNTER — Other Ambulatory Visit: Payer: Self-pay | Admitting: Oncology

## 2024-09-27 ENCOUNTER — Encounter: Payer: Self-pay | Admitting: Psychiatry

## 2024-09-27 ENCOUNTER — Encounter: Payer: Self-pay | Admitting: Oncology

## 2024-09-27 ENCOUNTER — Inpatient Hospital Stay: Attending: Psychiatry | Admitting: Psychiatry

## 2024-09-27 VITALS — BP 110/87 | HR 86 | Temp 97.7°F | Resp 19

## 2024-09-27 DIAGNOSIS — R180 Malignant ascites: Secondary | ICD-10-CM

## 2024-09-27 DIAGNOSIS — C561 Malignant neoplasm of right ovary: Secondary | ICD-10-CM

## 2024-09-27 DIAGNOSIS — Z90710 Acquired absence of both cervix and uterus: Secondary | ICD-10-CM

## 2024-09-27 DIAGNOSIS — N939 Abnormal uterine and vaginal bleeding, unspecified: Secondary | ICD-10-CM

## 2024-09-27 DIAGNOSIS — Z9079 Acquired absence of other genital organ(s): Secondary | ICD-10-CM

## 2024-09-27 DIAGNOSIS — Z7189 Other specified counseling: Secondary | ICD-10-CM

## 2024-09-27 DIAGNOSIS — Z90722 Acquired absence of ovaries, bilateral: Secondary | ICD-10-CM

## 2024-09-27 NOTE — Patient Instructions (Signed)
 It was a pleasure to see you in clinic today. - We discussed that you have stage IC1 high-grade serous ovarian cancer.  Recommend chemotherapy. - Chemotherapy is with 2 medications with an infusion throughout port most commonly.  Treatment is every 3 weeks for 6 treatments. - We also recommend referral to genetics.  We will also send your tumor for additional tumor testing. - Referral placed to medical oncology to start chemotherapy. - Return visit planned for 2 to 4 weeks for a repeat exam of the vaginal cuff.  After he finished chemo we will plan to see you back again.  Thank you very much for allowing me to provide care for you today.  I appreciate your confidence in choosing our Gynecologic Oncology team at [facility].  If you have any questions about your visit today please call our office or send us  a MyChart message and we will get back to you as soon as possible.

## 2024-09-27 NOTE — Progress Notes (Signed)
 Gynecologic Oncology Return Clinic Visit  Date of Service: 09/27/2024 Referring Provider: Ivonne Mustache, MD  PCP: Duanne Butler DASEN, MD [address] [facility] [address] [place],  [place] [address]  Assessment & Plan: Nina Ramos is a 78 y.o. woman with at least Stage IC1 who is s/p RA-TLH, BSO, right pelvic, para-aortic LAD, omentectomy for staging on 09/14/24.  Postop: - Pt recovering well from surgery and healing appropriately postoperatively - Ongoing postoperative expectations and precautions reviewed. Continue with no lifting >10lbs through 6 weeks postoperatively  Vaginal bleeding: - Cuff intact - Monsel's applied - Cuff check in 2-4 weeks.  Ovarian cancer: - Intraoperative findings and pathology results reviewed. - Recommend adjuvant treatment with carb/tax - Recommend NGS (with Her2, FOLR1, HRD testing) and referral to genetics - Treatment reviewed - Referral to medical oncology - Follow-up after completion of adjuvant treatment. - CA125 is a marker for her   RTC after completion of adjuvant treatment (cuff check with Eleanor Epps, NP in 2-4 weeks).  Hoy Masters, MD Gynecologic Oncology   Medical Decision Making I personally spent  TOTAL 30 minutes face-to-face and non-face-to-face in the care of this patient, which includes all pre, intra, and post visit time on the date of service. The discussion of treatment of ovarian cancer is beyond the scope of routine postoperative care.   ----------------------- Reason for Visit: Postop/treatment discussion  Treatment History: Oncology History  Malignant neoplasm of right ovary (HCC)  08/06/2024 Imaging   CT A/P wo contrast: IMPRESSION: 1. 5.7 cm complex cystic and solid right adnexal mass, highly concerning for ovarian neoplasm. Follow-up pelvic ultrasound is recommended. 2. Moderate ascites. Malignant ascites cannot be excluded given the right adnexal mass described above. Diagnostic paracentesis could be considered  for cytologic analysis. 3. Trace right pleural effusion. 4. Gallbladder sludge versus small layering gallstones. No evidence of acute cholecystitis. 5.  Aortic Atherosclerosis (ICD10-I70.0).  Pelvic ultrasound: IMPRESSION: 1. Suspected right adnexal mass on CT is not sonographically visualized. This remains suspicious for ovarian neoplasm when correlating with that study. 2. Large volume pelvic ascites.   08/08/2024 Pathology Results   A. ASCITES, PARACENTESIS:  FINAL MICROSCOPIC DIAGNOSIS:  - No malignant cells identified  - Numerous benign/reactive mesothelial cells with acute inflammatory  cells    08/08/2024 Tumor Marker   CA125: 127   08/16/2024 Imaging   CT c/a/p: IMPRESSION: 1. Large, mixed solid and cystic mass of the right ovary, measuring 7.4 x 5.3 x 4.3 cm. There is a thick rind of soft tissue at the right aspect of the lesion and predominantly cystic composition of the left aspect of the lesion. Findings are most consistent with primary ovarian malignancy. 2. Trace ascites in the low pelvis, significantly diminished compared to prior examination with intervening diagnostic paracentesis. This is presumed malignant although there is no directly visible peritoneal thickening or nodularity. 3. No directly visualized evidence of lymphadenopathy or metastatic disease in the chest, abdomen, or pelvis. 4. Small right, trace left pleural effusions. 5. Clustered centrilobular and tree-in-bud nodules in the lingula, most consistent with atypical mycobacterial infection. No suspicious pulmonary nodules. 6. Emphysema. 7. Coronary artery disease.   09/14/2024 Surgery   Procedures: Robotic-assisted total laparoscopic hysterectomy, bilateral salpingo-oophorectomy, right pelvic and para-aortic lymphadenectomy, omentectomy   Findings: On entry to abdomen, normal upper abdominal survey with smooth diaphragm, liver, stomach and normal appearing omentum and bowel.  No evidence of  carcinomatosis.  In the pelvis, 8 to 10 cm cystic and solid right ovarian mass.  Otherwise small  normal-appearing uterus.  Normal left ovary and bilateral fallopian tubes.  Small volume ascites in the pelvis.  With minimal manipulation of mass, and incidental rupture occurred. Of note, washings/ascites collected after cyst rupture with attempt to avoid fluid in the upper abdomen where cyst fluid drained. IOFS c/w cancer, not further classified at this time.    09/14/2024 Initial Diagnosis   Malignant neoplasm of right ovary (HCC)   09/14/2024 Pathology Results   A. 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 - Uterus with inactive endometrium and benign endocervical polyp. - Left ovary with no specific pathologic change. - Left fallopian tube with salpingitis isthmica nodosa and benign paratubal cyst.  Comment: A representative slide was seen in peer review by Dr. Janel, who concurs with the diagnosis of malignancy.  B. RIGHT PELVIC LYMPH NODES: - Six lymph nodes, all negative for carcinoma (0/6).  C. RIGHT PERI AORTIC LYMPH NODE: - Three lymph nodes, all negative for carcinoma (0/3).  D. OMENTUM: - Fibroadipose tissue, negative for carcinoma.  CASE SUMMARY: (OVARY or FALLOPIAN TUBE or PRIMARY PERITONEUM) Standard(s): AJCC-UICC 8, FIGO Cancer Report 2021  SPECIMEN Procedure: Total hysterectomy and bilateral salpingo-oophorectomy, omentectomy Specimen Integrity: - Right ovary integrity: Capsule intact - Left ovary integrity: Capsule intact - Right fallopian tube integrity: Serosa intact - Left fallopian tube integrity: Serosa intact  TUMOR Tumor Site: Right ovary Tumor Size: Greatest dimension 11.2 cm Histologic Type: High-grade serous carcinoma Histologic Grade: Not applicable Ovarian Surface Involvement: Not identified Fallopian Tube Surface  Involvement: Not identified Implants: Not applicable Other Tissue/ Organ Involvement: Right fallopian tube Largest Extrapelvic Peritoneal Focus: Not applicable Peritoneal/Ascitic Fluid Involvement: Results pending Chemotherapy Response Score (CRS): No known presurgical therapy  REGIONAL LYMPH NODES Regional Lymph Nodes Status: All regional lymph nodes negative for tumor cells - Number of lymph nodes examined: 9  DISTANT METASTASIS Distant Site(s) Involved, if applicable: Not applicable  pTNM CLASSIFICATION (AJCC 8th Edition): Modified Classification: Not applicable pT Category: pT1a T suffix: Not applicable pN Category: pN0 N Suffix: Not applicable pM - Not applicable  FIGO STAGE (2021 FIGO Cancer Report) FIGO Stage: IA    09/14/2024 Pathology Results   Cytology: FINAL MICROSCOPIC DIAGNOSIS:  - Atypical cells present    09/14/2024 Cancer Staging   Staging form: Ovary, Fallopian Tube, and Primary Peritoneal Carcinoma, AJCC 8th Edition - Clinical stage from 09/14/2024: FIGO Stage IC1, calculated as Stage IC (cT1c1, cN0, cM0) - Signed by Eldonna Mays, MD on 09/27/2024 Histopathologic type: Serous cystadenocarcinoma, NOS Histologic grade (G): G3 Histologic grading system: 4 grade system     Interval History: Pt reports that she is recovering well from surgery. She is using advil as needed for pain. She is eating and drinking well. She is voiding without issue and having regular bowel movements. Some bleeding.   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   Neuromuscular disorder (HCC)    neuropathy feet and legs   Neuropathy 10/19/2018   Osteoporosis    Pneumonia     Past Surgical History:  Procedure Laterality Date   LAPAROSCOPIC  TOTAL PELVIC LYMPHADENECTOMY Right 09/14/2024   Procedure: LAPAROSCOPIC RIGHT PELVIC AND PARA-AORTIC LYMPHADENECTOMY, OMENTECTOMY;  Surgeon:  Eldonna Mays, MD;  Location: [facility];  Service: Gynecology;  Laterality: Right;   NO PAST SURGERIES     ROBOTIC ASSISTED TOTAL HYSTERECTOMY WITH BILATERAL SALPINGO OOPHERECTOMY Bilateral 09/14/2024   Procedure: ROBOTIC ASSISTED TOTAL LAPAROSCOPIC HYSTERECTOMY WITH BILATERAL SALPINGO-OOPHORECTOMY;  Surgeon: Eldonna Mays, MD;  Location: [facility];  Service: Gynecology;  Laterality: Bilateral;  POSSIBLE STAGING, POSSIBLE LAPAROTOMY    Family History  Problem Relation Age of Onset   Asthma Mother    Heart disease Father    Breast cancer Paternal Aunt    Ovarian cancer Neg Hx    Colon cancer Neg Hx    Endometrial cancer Neg Hx     Social History   Socioeconomic History   Marital status: Married    Spouse name: Not on file   Number of children: Not on file   Years of education: Not on file   Highest education level: Not on file  Occupational History   Not on file  Tobacco Use   Smoking status: Former    Passive exposure: Never   Smokeless tobacco: Former    Quit date: 02/05/1994   Tobacco comments:    78 years old to 51 or 1 yr. old  Vaping Use   Vaping status: Never Used  Substance and Sexual Activity   Alcohol use: Never   Drug use: Never   Sexual activity: Not Currently  Other Topics Concern   Not on file  Social History Narrative   Not on file   Social Drivers of Health   Financial Resource Strain: Low Risk  (05/29/2023)   Overall Financial Resource Strain (CARDIA)    Difficulty of Paying Living Expenses: Not hard at all  Food Insecurity: No Food Insecurity (09/22/2024)   Hunger Vital Sign    Worried About Running Out of Food in the Last Year: Never true    Ran Out of Food in the Last Year: Never true  Transportation Needs: No Transportation Needs (09/22/2024)   PRAPARE - Administrator, Civil Service (Medical):  No    Lack of Transportation (Non-Medical): No  Physical Activity: Inactive (09/22/2024)   Exercise Vital Sign    Days of Exercise per Week: 0 days    Minutes of Exercise per Session: 0 min  Stress: No Stress Concern Present (09/22/2024)   [employer] of Occupational Health - Occupational Stress Questionnaire    Feeling of Stress: Only a little  Social Connections: Moderately Integrated (09/22/2024)   Social Connection and Isolation Panel    Frequency of Communication with Friends and Family: More than three times a week    Frequency of Social Gatherings with Friends and Family: More than three times a week    Attends Religious Services: 1 to 4 times per year    Active Member of [employer] or Organizations: No    Attends Engineer, Structural: Never    Marital Status: Married    Current Medications:  Current Outpatient Medications:    acetaminophen  (TYLENOL ) 325 MG tablet, Take 650 mg by mouth every 6 (six) hours as needed for moderate pain (pain score 4-6) or headache., Disp: , Rfl:    alendronate  (FOSAMAX ) 70 MG tablet, Take 70 mg by mouth once a week. Take with a full glass of water on an empty stomach., Disp: , Rfl:    apixaban (ELIQUIS) 2.5 MG TABS tablet, Take 1 tablet (2.5 mg total) by mouth 2 (two) times daily for 14 days., Disp: 28 tablet, Rfl: 0  Review of  Symptoms: Complete 10-system review is positive for: Headache, pelvic pain, abdominal distention, abdominal pain, vaginal bleeding, muscle cramp  Physical Exam: BP 110/87 (BP Location: Right Arm, Patient Position: Sitting)   Pulse 86   Temp 97.7 F (36.5 C) (Oral)   Resp 19  General: Alert, oriented, no acute distress. HEENT: Normocephalic, atraumatic. Neck symmetric without masses. Sclera anicteric.  Chest: Normal work of breathing. Clear to auscultation bilaterally.   Cardiovascular: Regular rate and rhythm, no murmurs. Abdomen: Soft, nontender.  Normoactive bowel sounds.  No masses appreciated.   Well-healing incisions. Dressing removed. Glue on incisions. Extremities: Grossly normal range of motion.  Warm, well perfused.  No edema bilaterally. Skin: No rashes or lesions noted. GU: Normal appearing external genitalia without erythema, excoriation, or lesions.  Speculum exam reveals clot at vaginal cuff. Clot removed and cuff intact with raw friable vaginal tissue particularly at right corner. Monsel's solution applied. Bimanual exam reveals intact vaginal cuff. Exam chaperoned by Kimberly Jordan, CMA   Laboratory & Radiologic Studies: Surgical pathology (09/14/24): A. 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 - Uterus with inactive endometrium and benign endocervical polyp. - Left ovary with no specific pathologic change. - Left fallopian tube with salpingitis isthmica nodosa and benign paratubal cyst.  Comment: A representative slide was seen in peer review by Dr. Janel, who concurs with the diagnosis of malignancy.  B. RIGHT PELVIC LYMPH NODES: - Six lymph nodes, all negative for carcinoma (0/6).  C. RIGHT PERI AORTIC LYMPH NODE: - Three lymph nodes, all negative for carcinoma (0/3).  D. OMENTUM: - Fibroadipose tissue, negative for carcinoma.  CASE SUMMARY: (OVARY or FALLOPIAN TUBE or PRIMARY PERITONEUM) Standard(s): AJCC-UICC 8, FIGO Cancer Report 2021  SPECIMEN Procedure: Total hysterectomy and bilateral salpingo-oophorectomy, omentectomy Specimen Integrity: - Right ovary integrity: Capsule intact - Left ovary integrity: Capsule intact - Right fallopian tube integrity: Serosa intact - Left fallopian tube integrity: Serosa intact  TUMOR Tumor Site: Right ovary Tumor Size: Greatest dimension 11.2 cm Histologic Type: High-grade serous carcinoma Histologic Grade: Not applicable Ovarian Surface Involvement: Not identified Fallopian Tube  Surface Involvement: Not identified Implants: Not applicable Other Tissue/ Organ Involvement: Right fallopian tube Largest Extrapelvic Peritoneal Focus: Not applicable Peritoneal/Ascitic Fluid Involvement: Results pending Chemotherapy Response Score (CRS): No known presurgical therapy  REGIONAL LYMPH NODES Regional Lymph Nodes Status: All regional lymph nodes negative for tumor cells - Number of lymph nodes examined: 9  DISTANT METASTASIS Distant Site(s) Involved, if applicable: Not applicable  pTNM CLASSIFICATION (AJCC 8th Edition): Modified Classification: Not applicable pT Category: pT1a T suffix: Not applicable pN Category: pN0 N Suffix: Not applicable pM - Not applicable  FIGO STAGE (2021 FIGO Cancer Report) FIGO Stage: IA  SPECIAL STUDIES: Not performed   Cytology (09/14/24): FINAL MICROSCOPIC DIAGNOSIS:  - Atypical cells present

## 2024-09-27 NOTE — Progress Notes (Signed)
 Requested [employer] incuding HRD, Her2 and FOLFR1 testing with [facility] via email.

## 2024-09-27 NOTE — Progress Notes (Signed)
 Gynecologic Oncology Multi-Disciplinary Disposition Conference Note  Date of the Conference: 09/27/2024  Patient Name: Nina Ramos  Referring Provider: Dr. Jillian Primary GYN Oncologist: Dr. Eldonna   Stage/Disposition:  At least stage ICI high grade serous carcinoma of the right fallopian tube and ovary. Disposition is to adjuvant chemotherapy with carboplatin/Taxol. Also send tumor for NGS testing and referral to genetic counseling.   This Multidisciplinary conference took place involving physicians from Gynecologic Oncology, Medical Oncology, Radiation Oncology, Pathology, Radiology along with the Gynecologic Oncology Nurse Practitioner and Gynecologic Oncology Nurse Navigator.  Comprehensive assessment of the patient's malignancy, staging, need for surgery, chemotherapy, radiation therapy, and need for further testing were reviewed. Supportive measures, both inpatient and following discharge were also discussed. The recommended plan of care is documented. Greater than 35 minutes were spent correlating and coordinating this patient's care.

## 2024-09-30 ENCOUNTER — Inpatient Hospital Stay: Admitting: Hematology and Oncology

## 2024-09-30 VITALS — BP 141/67 | HR 80 | Temp 97.6°F | Resp 18 | Ht 65.0 in | Wt 127.2 lb

## 2024-09-30 DIAGNOSIS — C561 Malignant neoplasm of right ovary: Secondary | ICD-10-CM

## 2024-09-30 LAB — SURGICAL PATHOLOGY

## 2024-09-30 MED ORDER — DEXAMETHASONE 4 MG PO TABS: ORAL_TABLET | ORAL | 6 refills | Status: AC

## 2024-09-30 MED ORDER — ONDANSETRON HCL 8 MG PO TABS: 8.0000 mg | ORAL_TABLET | Freq: Three times a day (TID) | ORAL | 1 refills | Status: AC | PRN

## 2024-09-30 MED ORDER — PROCHLORPERAZINE MALEATE 10 MG PO TABS: 10.0000 mg | ORAL_TABLET | Freq: Four times a day (QID) | ORAL | 1 refills | Status: AC | PRN

## 2024-09-30 MED ORDER — LIDOCAINE-PRILOCAINE 2.5-2.5 % EX CREA: TOPICAL_CREAM | CUTANEOUS | 3 refills | Status: AC

## 2024-10-01 ENCOUNTER — Encounter: Payer: Self-pay | Admitting: Hematology and Oncology

## 2024-10-01 ENCOUNTER — Other Ambulatory Visit: Payer: Self-pay

## 2024-10-01 NOTE — Assessment & Plan Note (Signed)
 The patient was discovered to have incidental pelvic mass in September when she was admitted for abdominal pain, leading to subsequent evaluation and surgery Her initial presentation revealed signs of ascites but cytology was negative She is recovering well from surgery and denies abdominal pain now We discussed her pathology report; fluid cytology showed atypical cell and final pathology revealed source of cancer from the right ovary I reviewed CT imaging with the patient and family We discussed rationale for adjuvant treatment  We reviewed the NCCN guidelines We discussed the role of chemotherapy. The intent is of curative intent.  We discussed some of the risks, benefits, side-effects of carboplatin & Taxol. Treatment is intravenous, every 3 weeks x 6 cycles  Some of the short term side-effects included, though not limited to, including weight loss, life threatening infections, risk of allergic reactions, need for transfusions of blood products, nausea, vomiting, change in bowel habits, loss of hair, admission to hospital for various reasons, and risks of death.   Long term side-effects are also discussed including risks of infertility, permanent damage to nerve function, hearing loss, chronic fatigue, kidney damage with possibility needing hemodialysis, and rare secondary malignancy including bone marrow disorders.  The patient is aware that the response rates discussed earlier is not guaranteed.  After a long discussion, patient made an informed decision to proceed with the prescribed plan of care.   Patient education material was dispensed. We discussed premedication with dexamethasone before chemotherapy. I recommend port placement and chemo education class The patient wants to start treatment as soon as possible and we will get her scheduled to start the week prior to Thanksgiving I will see her prior to cycle 2 of treatment for toxicity review

## 2024-10-01 NOTE — Progress Notes (Signed)
 [facility] CONSULT NOTE  Patient Care Team: Duanne Butler DASEN, MD as PCP - General (Family Medicine) [facility], [place]  ASSESSMENT & PLAN:  Malignant neoplasm of right ovary [facility]) The patient was discovered to have incidental pelvic mass in September when she was admitted for abdominal pain, leading to subsequent evaluation and surgery Her initial presentation revealed signs of ascites but cytology was negative She is recovering well from surgery and denies abdominal pain now We discussed her pathology report; fluid cytology showed atypical cell and final pathology revealed source of cancer from the right ovary I reviewed CT imaging with the patient and family We discussed rationale for adjuvant treatment  We reviewed the NCCN guidelines We discussed the role of chemotherapy. The intent is of curative intent.  We discussed some of the risks, benefits, side-effects of carboplatin & Taxol. Treatment is intravenous, every 3 weeks x 6 cycles  Some of the short term side-effects included, though not limited to, including weight loss, life threatening infections, risk of allergic reactions, need for transfusions of blood products, nausea, vomiting, change in bowel habits, loss of hair, admission to hospital for various reasons, and risks of death.   Long term side-effects are also discussed including risks of infertility, permanent damage to nerve function, hearing loss, chronic fatigue, kidney damage with possibility needing hemodialysis, and rare secondary malignancy including bone marrow disorders.  The patient is aware that the response rates discussed earlier is not guaranteed.  After a long discussion, patient made an informed decision to proceed with the prescribed plan of care.   Patient education material was dispensed. We discussed premedication with dexamethasone before chemotherapy. I recommend port placement and chemo education class The patient wants to  start treatment as soon as possible and we will get her scheduled to start the week prior to Thanksgiving I will see her prior to cycle 2 of treatment for toxicity review   Orders Placed This Encounter  Procedures   IR IMAGING GUIDED PORT INSERTION    Standing Status:   Future    Expected Date:   10/07/2024    Expiration Date:   09/30/2025    Reason for Exam (SYMPTOM  OR DIAGNOSIS REQUIRED):   need port for chemo    Preferred Imaging Location?:   [facility]   CBC with Differential (Cancer Center Only)    Standing Status:   Future    Expected Date:   10/11/2024    Expiration Date:   10/11/2025   CMP (Cancer Center only)    Standing Status:   Future    Expected Date:   10/11/2024    Expiration Date:   10/11/2025   CBC with Differential (Cancer Center Only)    Standing Status:   Future    Expected Date:   11/01/2024    Expiration Date:   11/01/2025   CMP (Cancer Center only)    Standing Status:   Future    Expected Date:   11/01/2024    Expiration Date:   11/01/2025   CBC with Differential (Cancer Center Only)    Standing Status:   Future    Expected Date:   11/22/2024    Expiration Date:   11/22/2025   CMP (Cancer Center only)    Standing Status:   Future    Expected Date:   11/22/2024    Expiration Date:   11/22/2025   CBC with Differential (Cancer Center Only)    Standing Status:   Future  Expected Date:   12/13/2024    Expiration Date:   12/13/2025   CMP (Cancer Center only)    Standing Status:   Future    Expected Date:   12/13/2024    Expiration Date:   12/13/2025   CBC with Differential (Cancer Center Only)    Standing Status:   Future    Expected Date:   01/03/2025    Expiration Date:   01/03/2026   CMP (Cancer Center only)    Standing Status:   Future    Expected Date:   01/03/2025    Expiration Date:   01/03/2026   CBC with Differential (Cancer Center Only)    Standing Status:   Future    Expected Date:   01/24/2025    Expiration Date:   01/24/2026   CMP (Cancer  Center only)    Standing Status:   Future    Expected Date:   01/24/2025    Expiration Date:   01/24/2026   CA 125    Standing Status:   Standing    Number of Occurrences:   11    Expiration Date:   10/01/2025    The total time spent in the appointment was 60 minutes encounter with patients including review of chart and various tests results, discussions about plan of care and coordination of care plan   All questions were answered. The patient knows to call the clinic with any problems, questions or concerns. No barriers to learning was detected.  Nina Bedford, MD 11/14/20258:38 AM  CHIEF COMPLAINTS/PURPOSE OF CONSULTATION:  Right ovarian cancer  HISTORY OF PRESENTING ILLNESS:  Nina Ramos 78 y.o. female is here because of recent diagnosis of right ovarian cancer, for adjuvant treatment She is here accompanied by her 2 daughters, Nina Ramos and Nina Ramos She lives with her husband at home and her children are close by She presented with severe abdominal pain, abnormal imaging study and abdominal ascites in September leading to multiple investigations and subsequent surgery  I have reviewed her chart and materials related to her cancer extensively and collaborated history with the patient. Summary of oncologic history is as follows: Oncology History  Malignant neoplasm of right ovary (HCC)  08/06/2024 Imaging   CT A/P wo contrast: IMPRESSION: 1. 5.7 cm complex cystic and solid right adnexal mass, highly concerning for ovarian neoplasm. Follow-up pelvic ultrasound is recommended. 2. Moderate ascites. Malignant ascites cannot be excluded given the right adnexal mass described above. Diagnostic paracentesis could be considered for cytologic analysis. 3. Trace right pleural effusion. 4. Gallbladder sludge versus small layering gallstones. No evidence of acute cholecystitis. 5.  Aortic Atherosclerosis (ICD10-I70.0).  Pelvic ultrasound: IMPRESSION: 1. Suspected right adnexal mass on CT is  not sonographically visualized. This remains suspicious for ovarian neoplasm when correlating with that study. 2. Large volume pelvic ascites.   08/08/2024 Pathology Results   A. ASCITES, PARACENTESIS:  FINAL MICROSCOPIC DIAGNOSIS:  - No malignant cells identified  - Numerous benign/reactive mesothelial cells with acute inflammatory  cells    08/08/2024 Tumor Marker   CA125: 127   08/08/2024 Tumor Marker   Patient's tumor was tested for the following markers: CA-125. Results of the tumor marker test revealed 127.   08/16/2024 Imaging   CT c/a/p: IMPRESSION: 1. Large, mixed solid and cystic mass of the right ovary, measuring 7.4 x 5.3 x 4.3 cm. There is a thick rind of soft tissue at the right aspect of the lesion and predominantly cystic composition of the left aspect of the  lesion. Findings are most consistent with primary ovarian malignancy. 2. Trace ascites in the low pelvis, significantly diminished compared to prior examination with intervening diagnostic paracentesis. This is presumed malignant although there is no directly visible peritoneal thickening or nodularity. 3. No directly visualized evidence of lymphadenopathy or metastatic disease in the chest, abdomen, or pelvis. 4. Small right, trace left pleural effusions. 5. Clustered centrilobular and tree-in-bud nodules in the lingula, most consistent with atypical mycobacterial infection. No suspicious pulmonary nodules. 6. Emphysema. 7. Coronary artery disease.   09/14/2024 Surgery   Procedures: Robotic-assisted total laparoscopic hysterectomy, bilateral salpingo-oophorectomy, right pelvic and para-aortic lymphadenectomy, omentectomy   Findings: On entry to abdomen, normal upper abdominal survey with smooth diaphragm, liver, stomach and normal appearing omentum and bowel.  No evidence of carcinomatosis.  In the pelvis, 8 to 10 cm cystic and solid right ovarian mass.  Otherwise small normal-appearing uterus.  Normal left  ovary and bilateral fallopian tubes.  Small volume ascites in the pelvis.  With minimal manipulation of mass, and incidental rupture occurred. Of note, washings/ascites collected after cyst rupture with attempt to avoid fluid in the upper abdomen where cyst fluid drained. IOFS c/w cancer, not further classified at this time.    09/14/2024 Initial Diagnosis   Malignant neoplasm of right ovary (HCC)   09/14/2024 Pathology Results   A. 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 - Uterus with inactive endometrium and benign endocervical polyp. - Left ovary with no specific pathologic change. - Left fallopian tube with salpingitis isthmica nodosa and benign paratubal cyst.  Comment: A representative slide was seen in peer review by Dr. Janel, who concurs with the diagnosis of malignancy.  B. RIGHT PELVIC LYMPH NODES: - Six lymph nodes, all negative for carcinoma (0/6).  C. RIGHT PERI AORTIC LYMPH NODE: - Three lymph nodes, all negative for carcinoma (0/3).  D. OMENTUM: - Fibroadipose tissue, negative for carcinoma.  CASE SUMMARY: (OVARY or FALLOPIAN TUBE or PRIMARY PERITONEUM) Standard(s): AJCC-UICC 8, FIGO Cancer Report 2021  SPECIMEN Procedure: Total hysterectomy and bilateral salpingo-oophorectomy, omentectomy Specimen Integrity: - Right ovary integrity: Capsule intact - Left ovary integrity: Capsule intact - Right fallopian tube integrity: Serosa intact - Left fallopian tube integrity: Serosa intact  TUMOR Tumor Site: Right ovary Tumor Size: Greatest dimension 11.2 cm Histologic Type: High-grade serous carcinoma Histologic Grade: Not applicable Ovarian Surface Involvement: Not identified Fallopian Tube Surface Involvement: Not identified Implants: Not applicable Other Tissue/ Organ Involvement: Right fallopian tube Largest Extrapelvic Peritoneal  Focus: Not applicable Peritoneal/Ascitic Fluid Involvement: Results pending Chemotherapy Response Score (CRS): No known presurgical therapy  REGIONAL LYMPH NODES Regional Lymph Nodes Status: All regional lymph nodes negative for tumor cells - Number of lymph nodes examined: 9  DISTANT METASTASIS Distant Site(s) Involved, if applicable: Not applicable  pTNM CLASSIFICATION (AJCC 8th Edition): Modified Classification: Not applicable pT Category: pT1a T suffix: Not applicable pN Category: pN0 N Suffix: Not applicable pM - Not applicable  FIGO STAGE (2021 FIGO Cancer Report) FIGO Stage: IA    09/14/2024 Pathology Results   Cytology: FINAL MICROSCOPIC DIAGNOSIS:  - Atypical cells present    09/14/2024 Cancer Staging   Staging form: Ovary, Fallopian Tube, and Primary Peritoneal Carcinoma, AJCC 8th Edition - Clinical stage from 09/14/2024: FIGO Stage IC1, calculated as Stage IC (cT1c1, cN0, cM0) - Signed by Eldonna Mays, MD on 09/27/2024 Histopathologic type: Serous cystadenocarcinoma, NOS Histologic grade (G): G3  Histologic grading system: 4 grade system   10/11/2024 -  Chemotherapy   Patient is on Treatment Plan : OVARIAN Carboplatin (AUC 6) + Paclitaxel (175) q21d X 6 Cycles       MEDICAL HISTORY:  Past Medical History:  Diagnosis Date   Chronic venous insufficiency    GAD (generalized anxiety disorder) 01/14/2014   History of kidney stones    Hyperlipidemia    Hypertension    no longer takes meds   Lichenoid dermatitis 10/05/2018   Neuromuscular disorder (HCC)    neuropathy feet and legs   Neuropathy 10/19/2018   Osteoporosis    Pneumonia     SURGICAL HISTORY: Past Surgical History:  Procedure Laterality Date   LAPAROSCOPIC TOTAL PELVIC LYMPHADENECTOMY Right 09/14/2024   Procedure: LAPAROSCOPIC RIGHT PELVIC AND PARA-AORTIC LYMPHADENECTOMY, OMENTECTOMY;  Surgeon: Eldonna Mays, MD;  Location: [facility];  Service: Gynecology;  Laterality: Right;   NO PAST  SURGERIES     ROBOTIC ASSISTED TOTAL HYSTERECTOMY WITH BILATERAL SALPINGO OOPHERECTOMY Bilateral 09/14/2024   Procedure: ROBOTIC ASSISTED TOTAL LAPAROSCOPIC HYSTERECTOMY WITH BILATERAL SALPINGO-OOPHORECTOMY;  Surgeon: Eldonna Mays, MD;  Location: [facility];  Service: Gynecology;  Laterality: Bilateral;  POSSIBLE STAGING, POSSIBLE LAPAROTOMY    SOCIAL HISTORY: Social History   Socioeconomic History   Marital status: Married    Spouse name: Not on file   Number of children: Not on file   Years of education: Not on file   Highest education level: Not on file  Occupational History   Not on file  Tobacco Use   Smoking status: Former    Passive exposure: Never   Smokeless tobacco: Former    Quit date: 02/05/1994   Tobacco comments:    78 years old to 66 or 86 yr. old  Vaping Use   Vaping status: Never Used  Substance and Sexual Activity   Alcohol use: Never   Drug use: Never   Sexual activity: Not Currently  Other Topics Concern   Not on file  Social History Narrative   Not on file   Social Drivers of Health   Financial Resource Strain: Low Risk  (05/29/2023)   Overall Financial Resource Strain (CARDIA)    Difficulty of Paying Living Expenses: Not hard at all  Food Insecurity: No Food Insecurity (09/30/2024)   Hunger Vital Sign    Worried About Running Out of Food in the Last Year: Never true    Ran Out of Food in the Last Year: Never true  Transportation Needs: No Transportation Needs (09/30/2024)   PRAPARE - Administrator, Civil Service (Medical): No    Lack of Transportation (Non-Medical): No  Physical Activity: Inactive (09/22/2024)   Exercise Vital Sign    Days of Exercise per Week: 0 days    Minutes of Exercise per Session: 0 min  Stress: No Stress Concern Present (09/22/2024)   [employer] of Occupational Health - Occupational Stress Questionnaire    Feeling of Stress: Only a little  Social Connections: Moderately Integrated (09/22/2024)   Social  Connection and Isolation Panel    Frequency of Communication with Friends and Family: More than three times a week    Frequency of Social Gatherings with Friends and Family: More than three times a week    Attends Religious Services: 1 to 4 times per year    Active Member of [employer] or Organizations: No    Attends Banker Meetings: Never    Marital Status: Married  Catering Manager Violence: Not At Risk (  09/30/2024)   Humiliation, Afraid, Rape, and Kick questionnaire    Fear of Current or Ex-Partner: No    Emotionally Abused: No    Physically Abused: No    Sexually Abused: No    FAMILY HISTORY: Family History  Problem Relation Age of Onset   Asthma Mother    Heart disease Father    Breast cancer Paternal Aunt    Ovarian cancer Neg Hx    Colon cancer Neg Hx    Endometrial cancer Neg Hx     ALLERGIES:  is allergic to morphine  and oxycodone .  MEDICATIONS:  Current Outpatient Medications  Medication Sig Dispense Refill   dexamethasone (DECADRON) 4 MG tablet Take 2 tabs at the night before and 2 tab the morning of chemotherapy, every 3 weeks, by mouth x 6 cycles 24 tablet 6   polyethylene glycol (MIRALAX  / GLYCOLAX ) 17 g packet Take 17 g by mouth daily.     acetaminophen  (TYLENOL ) 325 MG tablet Take 650 mg by mouth every 6 (six) hours as needed for moderate pain (pain score 4-6) or headache.     alendronate  (FOSAMAX ) 70 MG tablet Take 70 mg by mouth once a week. Take with a full glass of water on an empty stomach.     lidocaine -prilocaine (EMLA) cream Apply to affected area once 30 g 3   ondansetron  (ZOFRAN ) 8 MG tablet Take 1 tablet (8 mg total) by mouth every 8 (eight) hours as needed for nausea or vomiting. Start on the third day after carboplatin. 30 tablet 1   prochlorperazine  (COMPAZINE ) 10 MG tablet Take 1 tablet (10 mg total) by mouth every 6 (six) hours as needed for nausea or vomiting. 30 tablet 1   No current facility-administered medications for this visit.     REVIEW OF SYSTEMS:   Constitutional: Denies fevers, chills or abnormal night sweats Eyes: Denies blurriness of vision, double vision or watery eyes Ears, nose, mouth, throat, and face: Denies mucositis or sore throat Respiratory: Denies cough, dyspnea or wheezes Cardiovascular: Denies palpitation, chest discomfort or lower extremity swelling Gastrointestinal:  Denies nausea, heartburn or change in bowel habits Skin: Denies abnormal skin rashes Lymphatics: Denies new lymphadenopathy or easy bruising Neurological:Denies numbness, tingling or new weaknesses Behavioral/Psych: Mood is stable, no new changes  All other systems were reviewed with the patient and are negative.  PHYSICAL EXAMINATION: ECOG PERFORMANCE STATUS: 0 - Asymptomatic  Vitals:   09/30/24 1400  BP: (!) 141/67  Pulse: 80  Resp: 18  Temp: 97.6 F (36.4 C)  SpO2: 100%   Filed Weights   09/30/24 1400  Weight: 127 lb 3.2 oz (57.7 kg)    GENERAL:alert, no distress and comfortable SKIN: skin color, texture, turgor are normal, no rashes or significant lesions EYES: normal, conjunctiva are pink and non-injected, sclera clear OROPHARYNX:no exudate, no erythema and lips, buccal mucosa, and tongue normal  NECK: supple, thyroid  normal size, non-tender, without nodularity LYMPH:  no palpable lymphadenopathy in the cervical, axillary or inguinal LUNGS: clear to auscultation and percussion with normal breathing effort HEART: regular rate & rhythm and no murmurs and no lower extremity edema ABDOMEN:abdomen soft, non-tender and normal bowel sounds.  Noted well-healed surgical scar Musculoskeletal:no cyanosis of digits and no clubbing  PSYCH: alert & oriented x 3 with fluent speech NEURO: no focal motor/sensory deficits  LABORATORY DATA:  I have reviewed the data as listed Lab Results  Component Value Date   WBC 8.7 09/15/2024   HGB 11.2 (L) 09/15/2024   HCT 35.4 (  L) 09/15/2024   MCV 96.5 09/15/2024   PLT 154  09/15/2024   Recent Labs    08/06/24 1203 08/07/24 1004 08/08/24 0728 09/08/24 1357 09/15/24 0523  NA 138   < > 141 142 135  K 4.3   < > 3.9 4.1 4.9  CL 102   < > 112* 107 102  CO2 21*   < > 18* 27 27  GLUCOSE 178*   < > 79 87 146*  BUN 36*   < > 22 22 15   CREATININE 1.78*   < > 0.69 1.02* 0.76  CALCIUM 9.5   < > 7.9* 9.2 8.3*  GFRNONAA 29*   < > >60 56* >60  PROT 6.9  --  5.0* 6.6  --   ALBUMIN 4.5  --  3.3* 4.2  --   AST 38  --  31 25  --   ALT 16  --  9 16  --   ALKPHOS 59  --  40 47  --   BILITOT 0.9  --  0.5 0.3  --    < > = values in this interval not displayed.    RADIOGRAPHIC STUDIES: I have personally reviewed the radiological images as listed and agreed with the findings in the report. No results found.

## 2024-10-04 ENCOUNTER — Ambulatory Visit: Admitting: Family Medicine

## 2024-10-05 ENCOUNTER — Ambulatory Visit: Admitting: Family Medicine

## 2024-10-05 ENCOUNTER — Inpatient Hospital Stay

## 2024-10-05 ENCOUNTER — Ambulatory Visit: Attending: Family Medicine

## 2024-10-05 ENCOUNTER — Encounter: Payer: Self-pay | Admitting: Family Medicine

## 2024-10-05 ENCOUNTER — Other Ambulatory Visit (HOSPITAL_COMMUNITY): Payer: Self-pay | Admitting: Radiology

## 2024-10-05 VITALS — BP 150/86 | HR 80 | Temp 97.6°F | Ht 65.0 in | Wt 126.0 lb

## 2024-10-05 DIAGNOSIS — R002 Palpitations: Secondary | ICD-10-CM

## 2024-10-05 DIAGNOSIS — C561 Malignant neoplasm of right ovary: Secondary | ICD-10-CM

## 2024-10-05 NOTE — Progress Notes (Signed)
 Pharmacist Chemotherapy Monitoring - Initial Assessment    Anticipated start date: 10/12/24   The following has been reviewed per standard work regarding the patient's treatment regimen: The patient's diagnosis, treatment plan and drug doses, and organ/hematologic function Lab orders and baseline tests specific to treatment regimen  The treatment plan start date, drug sequencing, and pre-medications Prior authorization status  Patient's documented medication list, including drug-drug interaction screen and prescriptions for anti-emetics and supportive care specific to the treatment regimen The drug concentrations, fluid compatibility, administration routes, and timing of the medications to be used The patient's access for treatment and lifetime cumulative dose history, if applicable  The patient's medication allergies and previous infusion related reactions, if applicable   Changes made to treatment plan:  treatment plan date  Follow up needed:  Pending authorization for treatment  Port placement scheduled 10/06/24  Harlene JONELLE Nasuti, RPH, 10/05/2024  4:50 PM

## 2024-10-05 NOTE — Progress Notes (Signed)
 Subjective:    Patient ID: Nina Ramos, female    DOB: 02-26-1946, 78 y.o.   MRN: [id number]  HPI  Since I last saw the patient, she had a biopsy of the right pelvic mass.  Results are included below     A. 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 - Uterus with inactive endometrium and benign endocervical polyp. - Left ovary with no specific pathologic change. - Left fallopian tube with salpingitis isthmica nodosa and benign paratubal cyst.  Comment: A representative slide was seen in peer review by Dr. Janel, who concurs with the diagnosis of malignancy.  B. RIGHT PELVIC LYMPH NODES: - Six lymph nodes, all negative for carcinoma (0/6).  C. RIGHT PERI AORTIC LYMPH NODE: - Three lymph nodes, all negative for carcinoma (0/3).  D. OMENTUM: - Fibroadipose tissue, negative for carcinoma.    Patient has seen oncology.  They have recommended carboplatin and Taxol.  She will have cycles every 3 weeks for 6 cycles.  The planned start the week of Thanksgiving.  Ever since the patient had surgery, she reports palpitations.  She states that she will have 5 or 6 normal heartbeats and then it will feel like her heart skips a beat.  This occurs most often when she is lying down at night.  She has also noticed a worsening resting tremor in both hands.  She states that her lip is trembling as well.  She denies any chest pain or shortness of breath.  She denies any syncope.  She denies any racing heartbeat.  Anxiety seems to be the trigger for both symptoms. Past Medical History:  Diagnosis Date   Chronic venous insufficiency    GAD (generalized anxiety disorder) 01/14/2014   History of kidney stones    Hyperlipidemia    Hypertension    no longer takes meds   Lichenoid dermatitis 10/05/2018   Neuromuscular disorder (HCC)    neuropathy feet and legs   Neuropathy  10/19/2018   Osteoporosis    Pneumonia     Past Surgical History:  Procedure Laterality Date   LAPAROSCOPIC TOTAL PELVIC LYMPHADENECTOMY Right 09/14/2024   Procedure: LAPAROSCOPIC RIGHT PELVIC AND PARA-AORTIC LYMPHADENECTOMY, OMENTECTOMY;  Surgeon: Eldonna Mays, MD;  Location: [facility];  Service: Gynecology;  Laterality: Right;   NO PAST SURGERIES     ROBOTIC ASSISTED TOTAL HYSTERECTOMY WITH BILATERAL SALPINGO OOPHERECTOMY Bilateral 09/14/2024   Procedure: ROBOTIC ASSISTED TOTAL LAPAROSCOPIC HYSTERECTOMY WITH BILATERAL SALPINGO-OOPHORECTOMY;  Surgeon: Eldonna Mays, MD;  Location: [facility];  Service: Gynecology;  Laterality: Bilateral;  POSSIBLE STAGING, POSSIBLE LAPAROTOMY   Current Outpatient Medications on File Prior to Visit  Medication Sig Dispense Refill   acetaminophen  (TYLENOL ) 325 MG tablet Take 650 mg by mouth every 6 (six) hours as needed for moderate pain (pain score 4-6) or headache.     alendronate  (FOSAMAX ) 70 MG tablet Take 70 mg by mouth once a week. Take with a full glass of water on an empty stomach.     dexamethasone (DECADRON) 4 MG tablet Take 2 tabs at the night before and 2 tab the morning of chemotherapy, every 3 weeks, by mouth x 6 cycles 24 tablet 6   lidocaine -prilocaine (EMLA) cream Apply to affected area once 30 g 3   ondansetron  (ZOFRAN ) 8 MG tablet Take 1 tablet (8 mg total) by mouth every 8 (eight) hours as needed for nausea or vomiting. Start  on the third day after carboplatin. 30 tablet 1   polyethylene glycol (MIRALAX  / GLYCOLAX ) 17 g packet Take 17 g by mouth daily.     prochlorperazine  (COMPAZINE ) 10 MG tablet Take 1 tablet (10 mg total) by mouth every 6 (six) hours as needed for nausea or vomiting. 30 tablet 1   No current facility-administered medications on file prior to visit.   Social History   Socioeconomic History   Marital status: Married    Spouse name: Not on file   Number of children: Not on file   Years of education: Not on file    Highest education level: Not on file  Occupational History   Not on file  Tobacco Use   Smoking status: Former    Passive exposure: Never   Smokeless tobacco: Former    Quit date: 02/05/1994   Tobacco comments:    78 years old to 28 or 56 yr. old  Vaping Use   Vaping status: Never Used  Substance and Sexual Activity   Alcohol use: Never   Drug use: Never   Sexual activity: Not Currently  Other Topics Concern   Not on file  Social History Narrative   Not on file   Social Drivers of Health   Financial Resource Strain: Low Risk  (05/29/2023)   Overall Financial Resource Strain (CARDIA)    Difficulty of Paying Living Expenses: Not hard at all  Food Insecurity: No Food Insecurity (09/30/2024)   Hunger Vital Sign    Worried About Running Out of Food in the Last Year: Never true    Ran Out of Food in the Last Year: Never true  Transportation Needs: No Transportation Needs (09/30/2024)   PRAPARE - Administrator, Civil Service (Medical): No    Lack of Transportation (Non-Medical): No  Physical Activity: Inactive (09/22/2024)   Exercise Vital Sign    Days of Exercise per Week: 0 days    Minutes of Exercise per Session: 0 min  Stress: No Stress Concern Present (09/22/2024)   [employer] of Occupational Health - Occupational Stress Questionnaire    Feeling of Stress: Only a little  Social Connections: Moderately Integrated (09/22/2024)   Social Connection and Isolation Panel    Frequency of Communication with Friends and Family: More than three times a week    Frequency of Social Gatherings with Friends and Family: More than three times a week    Attends Religious Services: 1 to 4 times per year    Active Member of [employer] or Organizations: No    Attends Banker Meetings: Never    Marital Status: Married  Catering Manager Violence: Not At Risk (09/30/2024)   Humiliation, Afraid, Rape, and Kick questionnaire    Fear of Current or Ex-Partner: No     Emotionally Abused: No    Physically Abused: No    Sexually Abused: No   Family History  Problem Relation Age of Onset   Asthma Mother    Heart disease Father    Breast cancer Paternal Aunt    Ovarian cancer Neg Hx    Colon cancer Neg Hx    Endometrial cancer Neg Hx      Review of Systems  All other systems reviewed and are negative.      Objective:   Physical Exam Constitutional:      General: She is not in acute distress.    Appearance: Normal appearance. She is normal weight. She is not ill-appearing or toxic-appearing.  Cardiovascular:  Rate and Rhythm: Normal rate and regular rhythm.     Heart sounds: Normal heart sounds. No murmur heard.    No friction rub. No gallop.  Pulmonary:     Effort: Pulmonary effort is normal. No respiratory distress.     Breath sounds: Normal breath sounds. No stridor. No wheezing or rales.  Abdominal:     General: Bowel sounds are normal. There is no distension.     Palpations: Abdomen is soft. There is no mass.     Tenderness: There is no abdominal tenderness. There is no guarding.  Musculoskeletal:     Right lower leg: No edema.     Left lower leg: No edema.  Neurological:     Mental Status: She is alert.         Assessment & Plan:   Palpitations - Plan: TSH, Comprehensive metabolic panel with GFR, CBC with Differential/Platelet, LONG TERM MONITOR XT (3-14 DAYS) Exam today does show occasional PVCs on her exam.  I believe this is likely the cause of her palpitations.  I will check a TSH to rule out hyperthyroidism and also check her electrolytes.  I suspect that anxiety is exacerbating the PVCs.  I will schedule a 14-day cardiac monitor to rule out any significant cardiac arrhythmias such as atrial fibrillation, etc.  I believe that she is also dealing with an essential tremor.  She states that this has been present for years but has worsened recently.  I will check a TSH to rule out hyperthyroidism.  Consider a beta-blocker for  both if symptoms worsen

## 2024-10-05 NOTE — Progress Notes (Unsigned)
 EP to read.

## 2024-10-05 NOTE — H&P (Signed)
 Chief Complaint: Patient was seen in consultation today for Port-A-Cath placement for chemotherapy administration and management of right ovarian cancer.  Referring Provider(s): Dr. Almarie Bedford, MD  Supervising Physician: Vanice Revel  Patient Status: [facility] - Out-pt  Patient is Full Code  History of Present Illness: Nina Ramos is a 78 y.o. female  with PMHx notable for right ovarian cancer, HTN, HLD, chronic venous insufficiency, GAD, nephrolithiasis, neuropathy, osteoporosis, and others as delineated below.  Per Dr. Buzz progress note dated 11/13: Malignant neoplasm of right ovary [facility]) The patient was discovered to have incidental pelvic mass in September when she was admitted for abdominal pain, leading to subsequent evaluation and surgery Her initial presentation revealed signs of ascites but cytology was negative She is recovering well from surgery and denies abdominal pain now We discussed her pathology report; fluid cytology showed atypical cell and final pathology revealed source of cancer from the right ovary I reviewed CT imaging with the patient and family We discussed rationale for adjuvant treatment   We reviewed the NCCN guidelines We discussed the role of chemotherapy. The intent is of curative intent.   [...] The patient is aware that the response rates discussed earlier is not guaranteed.  After a long discussion, patient made an informed decision to proceed with the prescribed plan of care.    Patient education material was dispensed. We discussed premedication with dexamethasone before chemotherapy. I recommend port placement and chemo education class The patient wants to start treatment as soon as possible and we will get her scheduled to start the week prior to Thanksgiving I will see her prior to cycle 2 of treatment for toxicity review   Interventional Radiology was requested for Port-A-Cath placement. Patient is scheduled for same in IR  today.   Patient is alert and laying in bed, calm.  Patient is currently without any significant complaints. She does  Patient denies any fevers, headache, chest pain, SOB, cough, abdominal pain, nausea, vomiting or bleeding.     Past Medical History:  Diagnosis Date   Chronic venous insufficiency    GAD (generalized anxiety disorder) 01/14/2014   History of kidney stones    Hyperlipidemia    Hypertension    no longer takes meds   Lichenoid dermatitis 10/05/2018   Neuromuscular disorder (HCC)    neuropathy feet and legs   Neuropathy 10/19/2018   Osteoporosis    Pneumonia     Past Surgical History:  Procedure Laterality Date   LAPAROSCOPIC TOTAL PELVIC LYMPHADENECTOMY Right 09/14/2024   Procedure: LAPAROSCOPIC RIGHT PELVIC AND PARA-AORTIC LYMPHADENECTOMY, OMENTECTOMY;  Surgeon: Eldonna Mays, MD;  Location: [facility];  Service: Gynecology;  Laterality: Right;   NO PAST SURGERIES     ROBOTIC ASSISTED TOTAL HYSTERECTOMY WITH BILATERAL SALPINGO OOPHERECTOMY Bilateral 09/14/2024   Procedure: ROBOTIC ASSISTED TOTAL LAPAROSCOPIC HYSTERECTOMY WITH BILATERAL SALPINGO-OOPHORECTOMY;  Surgeon: Eldonna Mays, MD;  Location: [facility];  Service: Gynecology;  Laterality: Bilateral;  POSSIBLE STAGING, POSSIBLE LAPAROTOMY    Allergies: Morphine  and Oxycodone   Medications: Prior to Admission medications   Medication Sig Start Date End Date Taking? Authorizing Provider  acetaminophen  (TYLENOL ) 325 MG tablet Take 650 mg by mouth every 6 (six) hours as needed for moderate pain (pain score 4-6) or headache.    [provider]  alendronate  (FOSAMAX ) 70 MG tablet Take 70 mg by mouth once a week. Take with a full glass of water on an empty stomach.    [provider]  dexamethasone (DECADRON) 4 MG  tablet Take 2 tabs at the night before and 2 tab the morning of chemotherapy, every 3 weeks, by mouth x 6 cycles 09/30/24   Lonn Hicks, MD  lidocaine -prilocaine (EMLA) cream Apply to  affected area once 09/30/24   Gorsuch, Ni, MD  ondansetron  (ZOFRAN ) 8 MG tablet Take 1 tablet (8 mg total) by mouth every 8 (eight) hours as needed for nausea or vomiting. Start on the third day after carboplatin. 09/30/24   Lonn Hicks, MD  polyethylene glycol (MIRALAX  / GLYCOLAX ) 17 g packet Take 17 g by mouth daily.    [provider]  prochlorperazine  (COMPAZINE ) 10 MG tablet Take 1 tablet (10 mg total) by mouth every 6 (six) hours as needed for nausea or vomiting. 09/30/24   Lonn Hicks, MD     Family History  Problem Relation Age of Onset   Asthma Mother    Heart disease Father    Breast cancer Paternal Aunt    Ovarian cancer Neg Hx    Colon cancer Neg Hx    Endometrial cancer Neg Hx     Social History   Socioeconomic History   Marital status: Married    Spouse name: Not on file   Number of children: Not on file   Years of education: Not on file   Highest education level: Not on file  Occupational History   Not on file  Tobacco Use   Smoking status: Former    Passive exposure: Never   Smokeless tobacco: Former    Quit date: 02/05/1994   Tobacco comments:    78 years old to 50 or 61 yr. old  Vaping Use   Vaping status: Never Used  Substance and Sexual Activity   Alcohol use: Never   Drug use: Never   Sexual activity: Not Currently  Other Topics Concern   Not on file  Social History Narrative   Not on file   Social Drivers of Health   Financial Resource Strain: Low Risk  (05/29/2023)   Overall Financial Resource Strain (CARDIA)    Difficulty of Paying Living Expenses: Not hard at all  Food Insecurity: No Food Insecurity (09/30/2024)   Hunger Vital Sign    Worried About Running Out of Food in the Last Year: Never true    Ran Out of Food in the Last Year: Never true  Transportation Needs: No Transportation Needs (09/30/2024)   PRAPARE - Administrator, Civil Service (Medical): No    Lack of Transportation (Non-Medical): No  Physical  Activity: Inactive (09/22/2024)   Exercise Vital Sign    Days of Exercise per Week: 0 days    Minutes of Exercise per Session: 0 min  Stress: No Stress Concern Present (09/22/2024)   [employer] of Occupational Health - Occupational Stress Questionnaire    Feeling of Stress: Only a little  Social Connections: Moderately Integrated (09/22/2024)   Social Connection and Isolation Panel    Frequency of Communication with Friends and Family: More than three times a week    Frequency of Social Gatherings with Friends and Family: More than three times a week    Attends Religious Services: 1 to 4 times per year    Active Member of [employer] or Organizations: No    Attends Banker Meetings: Never    Marital Status: Married     Review of Systems: A 12 point ROS discussed and pertinent positives are indicated in the HPI above.  All other systems are negative.  Vital Signs:  BP (!) 171/67   Pulse 79   Temp 98 F (36.7 C) (Oral)   Resp 17   Ht 5' 6 (1.676 m)   Wt 130 lb (59 kg)   SpO2 98%   BMI 20.98 kg/m   Advance Care Plan: The advanced care place/surrogate decision maker was discussed at the time of visit and the patient did not wish to discuss or was not able to name a surrogate decision maker or provide an advance care plan.  Physical Exam Constitutional:      General: She is not in acute distress.    Appearance: Normal appearance.  HENT:     Mouth/Throat:     Mouth: Mucous membranes are dry.  Cardiovascular:     Rate and Rhythm: Normal rate and regular rhythm.     Pulses: Normal pulses.     Heart sounds: Normal heart sounds. No murmur heard. Pulmonary:     Effort: Pulmonary effort is normal.     Breath sounds: Normal breath sounds. No wheezing.  Musculoskeletal:        General: Normal range of motion.  Skin:    General: Skin is warm and dry.  Neurological:     Mental Status: She is alert and oriented to person, place, and time.  Psychiatric:        Mood and  Affect: Mood normal.        Behavior: Behavior normal.        Thought Content: Thought content normal.        Judgment: Judgment normal.     Imaging: No results found.  Labs:  CBC: Recent Labs    08/08/24 0728 09/08/24 1357 09/15/24 0523 09/15/24 1652 10/05/24 1032  WBC 4.3 3.3* 8.7  --  3.4*  HGB 11.1* 13.3 12.7 11.2* 12.6  HCT 36.0 41.5 38.4 35.4* 38.7  PLT 176 198 154  --  290    COAGS: No results for input(s): INR, APTT in the last 8760 hours.  BMP: Recent Labs    08/07/24 1004 08/08/24 0728 09/08/24 1357 09/15/24 0523 10/05/24 1032  NA 141 141 142 135 144  K 4.0 3.9 4.1 4.9 3.6  CL 109 112* 107 102 106  CO2 20* 18* 27 27 28   GLUCOSE 89 79 87 146* 76  BUN 24* 22 22 15 24   CALCIUM 8.2* 7.9* 9.2 8.3* 8.9  CREATININE 0.85 0.69 1.02* 0.76 0.76  GFRNONAA >60 >60 56* >60  --     LIVER FUNCTION TESTS: Recent Labs    08/06/24 1203 08/08/24 0728 09/08/24 1357 10/05/24 1032  BILITOT 0.9 0.5 0.3 0.5  AST 38 31 25 16   ALT 16 9 16 12   ALKPHOS 59 40 47  --   PROT 6.9 5.0* 6.6 6.1  ALBUMIN 4.5 3.3* 4.2  --     TUMOR MARKERS: No results for input(s): AFPTM, CEA, CA199, CHROMGRNA in the last 8760 hours.  Assessment and Plan: Per Dr. Buzz progress note dated 11/13: Malignant neoplasm of right ovary [facility]) The patient was discovered to have incidental pelvic mass in September when she was admitted for abdominal pain, leading to subsequent evaluation and surgery Her initial presentation revealed signs of ascites but cytology was negative She is recovering well from surgery and denies abdominal pain now We discussed her pathology report; fluid cytology showed atypical cell and final pathology revealed source of cancer from the right ovary I reviewed CT imaging with the patient and family We discussed rationale for adjuvant treatment  We reviewed the NCCN guidelines We discussed the role of chemotherapy. The intent is of curative intent.    [...] The patient is aware that the response rates discussed earlier is not guaranteed.  After a long discussion, patient made an informed decision to proceed with the prescribed plan of care.    Patient education material was dispensed. We discussed premedication with dexamethasone before chemotherapy. I recommend port placement and chemo education class The patient wants to start treatment as soon as possible and we will get her scheduled to start the week prior to Thanksgiving I will see her prior to cycle 2 of treatment for toxicity review  Patient presents for scheduled Port-A-Cath placement in IR today.  Patient has been NPO since 9 am.  IR will require 6 hour NPO status, and IR will therefore proceed no sooner than 15:00.  All labs and medications are within acceptable parameters.  Allergies reviewed as: Morphine  (N&V); Oxycodone  (N&V). Patient states she has had Fentanyl  without issues or concerns.  Risks and benefits of image guided port-a-catheter placement was discussed with the patient including, but not limited to bleeding, infection, pneumothorax, or fibrin sheath development and need for additional procedures.  All of the patient's questions were answered, patient is agreeable to proceed. Consent signed and in chart.    Thank you for allowing our service to participate in Nina Ramos 's care.  Electronically Signed: Carlin DELENA Griffon, PA-C   10/06/2024, 1:13 PM      I spent a total of 30 Minutes in face to face in clinical consultation, greater than 50% of which was counseling/coordinating care for  Port-A-Cath placement for chemotherapy administration and management of right ovarian cancer.

## 2024-10-06 ENCOUNTER — Encounter: Payer: Self-pay | Admitting: Hematology and Oncology

## 2024-10-06 ENCOUNTER — Other Ambulatory Visit: Payer: Self-pay

## 2024-10-06 ENCOUNTER — Encounter (HOSPITAL_COMMUNITY): Payer: Self-pay

## 2024-10-06 ENCOUNTER — Ambulatory Visit (HOSPITAL_COMMUNITY)
Admission: RE | Admit: 2024-10-06 | Discharge: 2024-10-06 | Disposition: A | Discharge status: Home / Self Care | Source: Ambulatory Visit | Attending: Hematology and Oncology | Admitting: Hematology and Oncology

## 2024-10-06 DIAGNOSIS — E785 Hyperlipidemia, unspecified: Secondary | ICD-10-CM | POA: Insufficient documentation

## 2024-10-06 DIAGNOSIS — I10 Essential (primary) hypertension: Secondary | ICD-10-CM | POA: Insufficient documentation

## 2024-10-06 DIAGNOSIS — F411 Generalized anxiety disorder: Secondary | ICD-10-CM | POA: Diagnosis not present

## 2024-10-06 DIAGNOSIS — Z87891 Personal history of nicotine dependence: Secondary | ICD-10-CM | POA: Diagnosis not present

## 2024-10-06 DIAGNOSIS — C561 Malignant neoplasm of right ovary: Secondary | ICD-10-CM | POA: Insufficient documentation

## 2024-10-06 DIAGNOSIS — M810 Age-related osteoporosis without current pathological fracture: Secondary | ICD-10-CM | POA: Diagnosis not present

## 2024-10-06 DIAGNOSIS — I872 Venous insufficiency (chronic) (peripheral): Secondary | ICD-10-CM | POA: Insufficient documentation

## 2024-10-06 HISTORY — PX: IR IMAGING GUIDED PORT INSERTION: IMG5740

## 2024-10-06 LAB — CBC WITH DIFFERENTIAL/PLATELET
Absolute Lymphocytes: 928 {cells}/uL (ref 850–3900)
Absolute Monocytes: 479 {cells}/uL (ref 200–950)
Basophils Absolute: 10 {cells}/uL (ref 0–200)
Basophils Relative: 0.3 %
Eosinophils Absolute: 61 {cells}/uL (ref 15–500)
Eosinophils Relative: 1.8 %
HCT: 38.7 % (ref 35.0–45.0)
Hemoglobin: 12.6 g/dL (ref 11.7–15.5)
MCH: 32.1 pg (ref 27.0–33.0)
MCHC: 32.6 g/dL (ref 32.0–36.0)
MCV: 98.5 fL (ref 80.0–100.0)
MPV: 10 fL (ref 7.5–12.5)
Monocytes Relative: 14.1 %
Neutro Abs: 1921 {cells}/uL (ref 1500–7800)
Neutrophils Relative %: 56.5 %
Platelets: 290 Thousand/uL (ref 140–400)
RBC: 3.93 Million/uL (ref 3.80–5.10)
RDW: 13 % (ref 11.0–15.0)
Total Lymphocyte: 27.3 %
WBC: 3.4 Thousand/uL — ABNORMAL LOW (ref 3.8–10.8)

## 2024-10-06 LAB — COMPREHENSIVE METABOLIC PANEL WITH GFR
AG Ratio: 2.1 (calc) (ref 1.0–2.5)
ALT: 12 U/L (ref 6–29)
AST: 16 U/L (ref 10–35)
Albumin: 4.1 g/dL (ref 3.6–5.1)
Alkaline phosphatase (APISO): 36 U/L — ABNORMAL LOW (ref 37–153)
BUN: 24 mg/dL (ref 7–25)
CO2: 28 mmol/L (ref 20–32)
Calcium: 8.9 mg/dL (ref 8.6–10.4)
Chloride: 106 mmol/L (ref 98–110)
Creat: 0.76 mg/dL (ref 0.60–1.00)
Globulin: 2 g/dL (ref 1.9–3.7)
Glucose, Bld: 76 mg/dL (ref 65–99)
Potassium: 3.6 mmol/L (ref 3.5–5.3)
Sodium: 144 mmol/L (ref 135–146)
Total Bilirubin: 0.5 mg/dL (ref 0.2–1.2)
Total Protein: 6.1 g/dL (ref 6.1–8.1)
eGFR: 80 mL/min/1.73m2 (ref >=60)

## 2024-10-06 LAB — CA 125: Cancer Antigen (CA) 125: 31.3 U/mL (ref 0.0–38.1)

## 2024-10-06 LAB — TSH: TSH: 1.78 m[IU]/L (ref 0.40–4.50)

## 2024-10-06 MED ORDER — LIDOCAINE-EPINEPHRINE 1 %-1:100000 IJ SOLN
INTRAMUSCULAR | Status: AC
Start: 2024-10-06 — End: 2024-10-06
  Filled 2024-10-06: qty 1

## 2024-10-06 MED ORDER — HEPARIN SOD (PORK) LOCK FLUSH 100 UNIT/ML IV SOLN
500.0000 [IU] | Freq: Once | INTRAVENOUS | Status: AC
  Administered 2024-10-06: 500 [IU] via INTRAVENOUS

## 2024-10-06 MED ORDER — HEPARIN SOD (PORK) LOCK FLUSH 100 UNIT/ML IV SOLN
INTRAVENOUS | Status: AC
Start: 2024-10-06 — End: 2024-10-06
  Filled 2024-10-06: qty 5

## 2024-10-06 MED ORDER — ACETAMINOPHEN 325 MG PO TABS
650.0000 mg | ORAL_TABLET | Freq: Four times a day (QID) | ORAL | Status: DC | PRN
  Administered 2024-10-06: 650 mg via ORAL
  Filled 2024-10-06: qty 2

## 2024-10-06 MED ORDER — SODIUM CHLORIDE 0.9 % IV SOLN
12.5000 mg | Freq: Once | INTRAVENOUS | Status: AC
  Administered 2024-10-06: 12.5 mg via INTRAVENOUS
  Filled 2024-10-06: qty 12.5

## 2024-10-06 MED ORDER — FENTANYL CITRATE (PF) 100 MCG/2ML IJ SOLN
INTRAMUSCULAR | Status: AC | PRN
  Administered 2024-10-06: 50 ug via INTRAVENOUS

## 2024-10-06 MED ORDER — MIDAZOLAM HCL (PF) 2 MG/2ML IJ SOLN
INTRAMUSCULAR | Status: AC | PRN
  Administered 2024-10-06: 1 mg via INTRAVENOUS
  Administered 2024-10-06: .5 mg via INTRAVENOUS

## 2024-10-06 MED ORDER — MIDAZOLAM HCL 2 MG/2ML IJ SOLN
INTRAMUSCULAR | Status: AC
  Filled 2024-10-06: qty 2

## 2024-10-06 MED ORDER — LIDOCAINE-EPINEPHRINE 1 %-1:100000 IJ SOLN
20.0000 mL | Freq: Once | INTRAMUSCULAR | Status: AC
  Administered 2024-10-06: 20 mL via INTRADERMAL

## 2024-10-06 MED ORDER — FENTANYL CITRATE (PF) 100 MCG/2ML IJ SOLN
INTRAMUSCULAR | Status: AC
  Filled 2024-10-06: qty 2

## 2024-10-06 MED ORDER — SODIUM CHLORIDE 0.9 % IV SOLN: INTRAVENOUS | Status: DC

## 2024-10-06 NOTE — Sedation Documentation (Addendum)
 Pt stated eating breakfast at 0845. MD Shick aware, procedure delayed until 1445 to use two sedation medications.

## 2024-10-06 NOTE — Progress Notes (Signed)
 Discharge instructions reviewed with patient and daughter [name] at the bedside. Denies questions or concerns. PT complained of Nausea during short stay visit. Was given IV phenergan  states relief was obtained. No s/s of complications at the incision site. Pt escorted from the unit via wheel chair to personal vehicle.

## 2024-10-06 NOTE — Procedures (Signed)
Interventional Radiology Procedure Note  Procedure: RT internal jugular POWER PORT    Complications: None  Estimated Blood Loss:  MIN  Findings: TIP SVCRA    M. TREVOR Levaeh Vice, MD    

## 2024-10-07 ENCOUNTER — Ambulatory Visit: Payer: Self-pay | Admitting: Family Medicine

## 2024-10-07 ENCOUNTER — Encounter (HOSPITAL_COMMUNITY): Payer: Self-pay

## 2024-10-11 ENCOUNTER — Inpatient Hospital Stay

## 2024-10-11 DIAGNOSIS — C561 Malignant neoplasm of right ovary: Secondary | ICD-10-CM

## 2024-10-11 LAB — CMP (CANCER CENTER ONLY)
ALT: 16 U/L (ref 0–44)
AST: 23 U/L (ref 15–41)
Albumin: 4.2 g/dL (ref 3.5–5.0)
Alkaline Phosphatase: 43 U/L (ref 38–126)
Anion gap: 7 (ref 5–15)
BUN: 22 mg/dL (ref 8–23)
CO2: 28 mmol/L (ref 22–32)
Calcium: 9.2 mg/dL (ref 8.9–10.3)
Chloride: 108 mmol/L (ref 98–111)
Creatinine: 0.9 mg/dL (ref 0.44–1.00)
GFR, Estimated: >60 mL/min (ref >60)
Glucose, Bld: 93 mg/dL (ref 70–99)
Potassium: 4.3 mmol/L (ref 3.5–5.1)
Sodium: 144 mmol/L (ref 135–145)
Total Bilirubin: 0.4 mg/dL (ref 0.0–1.2)
Total Protein: 6.4 g/dL — ABNORMAL LOW (ref 6.5–8.1)

## 2024-10-11 LAB — CBC WITH DIFFERENTIAL (CANCER CENTER ONLY)
Abs Immature Granulocytes: 0.01 K/uL (ref 0.00–0.07)
Basophils Absolute: 0 K/uL (ref 0.0–0.1)
Basophils Relative: 0 %
Eosinophils Absolute: 0.1 K/uL (ref 0.0–0.5)
Eosinophils Relative: 4 %
HCT: 36 % (ref 36.0–46.0)
Hemoglobin: 12.1 g/dL (ref 12.0–15.0)
Immature Granulocytes: 0 %
Lymphocytes Relative: 29 %
Lymphs Abs: 1.2 K/uL (ref 0.7–4.0)
MCH: 32.6 pg (ref 26.0–34.0)
MCHC: 33.6 g/dL (ref 30.0–36.0)
MCV: 97 fL (ref 80.0–100.0)
Monocytes Absolute: 0.5 K/uL (ref 0.1–1.0)
Monocytes Relative: 12 %
Neutro Abs: 2.2 K/uL (ref 1.7–7.7)
Neutrophils Relative %: 55 %
Platelet Count: 226 K/uL (ref 150–400)
RBC: 3.71 MIL/uL — ABNORMAL LOW (ref 3.87–5.11)
RDW: 14.5 % (ref 11.5–15.5)
WBC Count: 4 K/uL (ref 4.0–10.5)
nRBC: 0 % (ref 0.0–0.2)

## 2024-10-11 MED FILL — Fosaprepitant Dimeglumine For IV Infusion 150 MG (Base Eq): INTRAVENOUS | Qty: 5 | Status: AC

## 2024-10-12 ENCOUNTER — Inpatient Hospital Stay: Admitting: Licensed Clinical Social Worker

## 2024-10-12 ENCOUNTER — Encounter: Payer: Self-pay | Admitting: Hematology and Oncology

## 2024-10-12 ENCOUNTER — Inpatient Hospital Stay

## 2024-10-12 VITALS — BP 148/72 | HR 73 | Temp 98.0°F | Resp 16 | Wt 127.8 lb

## 2024-10-12 DIAGNOSIS — C561 Malignant neoplasm of right ovary: Secondary | ICD-10-CM

## 2024-10-12 MED ORDER — SODIUM CHLORIDE 0.9 % IV SOLN
175.0000 mg/m2 | Freq: Once | INTRAVENOUS | Status: AC
  Administered 2024-10-12: 288 mg via INTRAVENOUS
  Filled 2024-10-12: qty 48

## 2024-10-12 MED ORDER — SODIUM CHLORIDE 0.9 % IV SOLN: INTRAVENOUS | Status: DC

## 2024-10-12 MED ORDER — SODIUM CHLORIDE 0.9 % IV SOLN
399.6000 mg | Freq: Once | INTRAVENOUS | Status: AC
  Administered 2024-10-12: 400 mg via INTRAVENOUS
  Filled 2024-10-12: qty 40

## 2024-10-12 MED ORDER — SODIUM CHLORIDE 0.9 % IV SOLN
150.0000 mg | Freq: Once | INTRAVENOUS | Status: AC
  Administered 2024-10-12: 150 mg via INTRAVENOUS
  Filled 2024-10-12: qty 150

## 2024-10-12 MED ORDER — FAMOTIDINE IN NACL 20-0.9 MG/50ML-% IV SOLN
20.0000 mg | Freq: Once | INTRAVENOUS | Status: AC
  Administered 2024-10-12: 20 mg via INTRAVENOUS
  Filled 2024-10-12: qty 50

## 2024-10-12 MED ORDER — DEXAMETHASONE SOD PHOSPHATE PF 10 MG/ML IJ SOLN
10.0000 mg | Freq: Once | INTRAMUSCULAR | Status: AC
  Administered 2024-10-12: 10 mg via INTRAVENOUS

## 2024-10-12 MED ORDER — PALONOSETRON HCL INJECTION 0.25 MG/5ML
0.2500 mg | Freq: Once | INTRAVENOUS | Status: AC
  Administered 2024-10-12: 0.25 mg via INTRAVENOUS
  Filled 2024-10-12: qty 5

## 2024-10-12 MED ORDER — DIPHENHYDRAMINE HCL 50 MG/ML IJ SOLN
12.5000 mg | Freq: Once | INTRAMUSCULAR | Status: AC
  Administered 2024-10-12: 12.5 mg via INTRAVENOUS
  Filled 2024-10-12: qty 1

## 2024-10-12 NOTE — Patient Instructions (Signed)
 [facility] - A DEPT OF [facility]  Discharge Instructions: Thank you for choosing [facility] to provide your oncology and hematology care.   If you have a lab appointment with the Cancer Center, please go directly to the Cancer Center and check in at the registration area.   Wear comfortable clothing and clothing appropriate for easy access to any Portacath or PICC line.   We strive to give you quality time with your provider. You may need to reschedule your appointment if you arrive late (15 or more minutes).  Arriving late affects you and other patients whose appointments are after yours.  Also, if you miss three or more appointments without notifying the office, you may be dismissed from the clinic at the provider's discretion.      For prescription refill requests, have your pharmacy contact our office and allow 72 hours for refills to be completed.    Today you received the following chemotherapy and/or immunotherapy agents: Paclitaxel , Carboplatin .       To help prevent nausea and vomiting after your treatment, we encourage you to take your nausea medication as directed.  BELOW ARE SYMPTOMS THAT SHOULD BE REPORTED IMMEDIATELY: *FEVER GREATER THAN 100.4 F (38 C) OR HIGHER *CHILLS OR SWEATING *NAUSEA AND VOMITING THAT IS NOT CONTROLLED WITH YOUR NAUSEA MEDICATION *UNUSUAL SHORTNESS OF BREATH *UNUSUAL BRUISING OR BLEEDING *URINARY PROBLEMS (pain or burning when urinating, or frequent urination) *BOWEL PROBLEMS (unusual diarrhea, constipation, pain near the anus) TENDERNESS IN MOUTH AND THROAT WITH OR WITHOUT PRESENCE OF ULCERS (sore throat, sores in mouth, or a toothache) UNUSUAL RASH, SWELLING OR PAIN  UNUSUAL VAGINAL DISCHARGE OR ITCHING   Items with * indicate a potential emergency and should be followed up as soon as possible or go to the Emergency Department if any problems should occur.  Please show the CHEMOTHERAPY ALERT CARD  or IMMUNOTHERAPY ALERT CARD at check-in to the Emergency Department and triage nurse.  Should you have questions after your visit or need to cancel or reschedule your appointment, please contact [facility] - A DEPT OF [facility]  Dept: [phone]  and follow the prompts.  Office hours are 8:00 a.m. to 4:30 p.m. Monday - Friday. Please note that voicemails left after 4:00 p.m. may not be returned until the following business day.  We are closed weekends and major holidays. You have access to a nurse at all times for urgent questions. Please call the main number to the clinic Dept: [phone] and follow the prompts.   For any non-urgent questions, you may also contact your provider using MyChart. We now offer e-Visits for anyone 64 and older to request care online for non-urgent symptoms. For details visit [email or web address].   Also download the MyChart app! Go to the app store, search MyChart, open the app, select [employer], and log in with your MyChart username and password.

## 2024-10-12 NOTE — Progress Notes (Signed)
 [facility] Clinical Social Work  Initial Assessment   Nina Ramos is a 78 y.o. year old female accompanied by daughter, [name]. Clinical Social Work was referred by new patient protocol for assessment of psychosocial needs.   SDOH (Social Determinants of Health) assessments performed: No SDOH Interventions    Flowsheet Row Clinical Support from 10/12/2024 in [facility] - A [facility] Of [place] Clinical Support from 09/22/2024 in [facility] Clinical Support from 05/29/2023 in [facility] Clinical Support from 05/16/2022 in [facility]  SDOH Interventions      Food Insecurity Interventions Intervention Not Indicated Intervention Not Indicated Intervention Not Indicated Intervention Not Indicated  Housing Interventions Intervention Not Indicated Intervention Not Indicated Intervention Not Indicated Intervention Not Indicated  Transportation Interventions Intervention Not Indicated Intervention Not Indicated Intervention Not Indicated Intervention Not Indicated  Utilities Interventions Intervention Not Indicated Intervention Not Indicated Intervention Not Indicated --  Alcohol Usage Interventions -- -- Intervention Not Indicated (Score <7) --  Financial Strain Interventions -- -- Intervention Not Indicated Intervention Not Indicated  Physical Activity Interventions -- Intervention Not Indicated Intervention Not Indicated Intervention Not Indicated  Stress Interventions -- Intervention Not Indicated Intervention Not Indicated Intervention Not Indicated  Social Connections Interventions -- Intervention Not Indicated Intervention Not Indicated Intervention Not Indicated  Health Literacy Interventions -- Intervention Not Indicated Intervention Not Indicated --    SDOH Screenings   Food Insecurity: No Food Insecurity (10/12/2024)  Housing: Low Risk  (10/12/2024)  Transportation Needs: No  Transportation Needs (10/12/2024)  Utilities: Not At Risk (10/12/2024)  Alcohol Screen: Low Risk  (05/29/2023)  Depression (PHQ2-9): Low Risk  (09/30/2024)  Financial Resource Strain: Low Risk  (05/29/2023)  Physical Activity: Inactive (09/22/2024)  Social Connections: Moderately Integrated (09/22/2024)  Stress: No Stress Concern Present (09/22/2024)  Tobacco Use: Medium Risk (10/05/2024)  Health Literacy: Adequate Health Literacy (09/22/2024)    PHQ 2/9:    09/30/2024    3:15 PM 09/22/2024   11:26 AM 05/29/2023    3:32 PM  Depression screen PHQ 2/9  Decreased Interest 0 0 0  Down, Depressed, Hopeless 0 0 0  PHQ - 2 Score 0 0 0     Distress Screen completed: No    10/05/2024    7:03 PM  [facility] DISTRESS SCREENING  Screening Type Initial Screening  How much distress have you been experiencing in the past week? (0-10) 10  Practical concerns type Treatment decisions  Emotional concerns type Worry or anxiety;Sadness or depression;Loss of interest or enjoyment;Fear;Anger  Spiritual/Religous concerns type Changes in faith or beliefs  Physical Concerns Type  Pain;Sleep;Fatigue      Family/Social Information:  Housing Arrangement: patient lives with her husband. Daughter, [name] lives behind her. Other daughter, [name], lives 45 min away but works behind the house and is there daily. Family members/support persons in your life? Family- daughters, son in law, granddaughter & her husband Transportation concerns: no  Employment: Retired.  Income source: Actor concerns: No Type of concern: None Food access concerns: no Religious or spiritual practice: Not known Advanced directives: Not known Services Currently in place:  [facility] Medicare  Coping/ Adjustment to diagnosis: Patient understands treatment plan and what happens next? yes, starting her first chemo treatment today. She says the news was devastating but she is grateful that her son-in-law helped  advocate for her and that it was caught when it was. She has  extremely strong support from her family and has felt very card for so far from the medical team Patient reported stressors: Adjusting to my illness Patient enjoys time with family/ friends Current coping skills/ strengths: Ability for insight , Manufacturing systems engineer , Motivation for treatment/growth , and Supportive family/friends     SUMMARY: Current SDOH Barriers:  No major barriers identified today  Clinical Social Work Clinical Goal(s):  No clinical social work goals at this time  Interventions: Discussed common feeling and emotions when being diagnosed with cancer, and the importance of support during treatment Informed patient of the support team roles and support services at [facility] Provided CSW contact information and encouraged patient to call with any questions or concerns   Follow Up Plan: Patient will contact CSW with any support or resource needs Patient verbalizes understanding of plan: Yes    Aviela Blundell E Zarielle Cea, LCSW Clinical Social Worker [facility]

## 2024-10-13 ENCOUNTER — Telehealth: Payer: Self-pay

## 2024-10-13 NOTE — Telephone Encounter (Signed)
 Attempted to call patient to check-in following first time chemotherapy treatment.  Patient not available and voicemail box not set up to leave message. Will attempt to call back on Friday.

## 2024-10-15 ENCOUNTER — Other Ambulatory Visit: Payer: Self-pay | Admitting: Hematology and Oncology

## 2024-10-15 MED ORDER — TRAMADOL HCL 50 MG PO TABS: 50.0000 mg | ORAL_TABLET | Freq: Four times a day (QID) | ORAL | 0 refills | Status: AC | PRN

## 2024-10-15 NOTE — Telephone Encounter (Signed)
 Called and given below message. She verbalized understanding and appreciated the call. She will call the office back Monday with update.

## 2024-10-15 NOTE — Telephone Encounter (Signed)
 If she has tramadol  tell her to try 1 or 1/2 tab and take with tylenol  every 6 hours until her pain resolves for next 2 days She can also take claritin 10 mg daily for next 2 days Call back Monday

## 2024-10-15 NOTE — Telephone Encounter (Signed)
 Called to see how she is doing today. She complaining of all over joint pain that started with treatment on 11/25. She is taking tylenol  arthritis every 8 hours that is not helping. She accidentally took Advil  yesterday per her husband that helped with the joint pain. She is complaining of lack of appetite. Per husband and Sharonne she will try to eat 5-6 meals per day. She is able to drink oral fluids with no problems.  She had a tramadol  prescription that she never took to be worried that it would be too strong but is unable to find the prescription. She is asking that you recommend? She uses [facility] in [place] and is willing to try Tramadol .

## 2024-10-17 ENCOUNTER — Telehealth: Payer: Self-pay | Admitting: Hematology

## 2024-10-17 NOTE — Telephone Encounter (Signed)
 Pt called on-call service, and reported generalized weakness, decreased appetite, hot flushes and body aches.  Symptoms started yesterday, and worse today.  She also has numbness in the left foot, no leg edema.  No fever or chills.  She received her first cycle of chemotherapy carboplatin  and paclitaxel  on November 25.  Her symptoms are likely related to her chemotherapy.  I told patient to check COVID and flu like symptoms given her body aches. I encouraged her to call back in the next few days if her symptom does not improve, to see if she needs to be seen.  Onita Mattock MD 10/17/2024

## 2024-10-18 ENCOUNTER — Inpatient Hospital Stay: Attending: Gynecologic Oncology | Admitting: Gynecologic Oncology

## 2024-10-18 VITALS — BP 142/68 | HR 95 | Temp 97.9°F | Resp 19 | Wt 124.0 lb

## 2024-10-18 DIAGNOSIS — G620 Drug-induced polyneuropathy: Secondary | ICD-10-CM | POA: Insufficient documentation

## 2024-10-18 DIAGNOSIS — F419 Anxiety disorder, unspecified: Secondary | ICD-10-CM | POA: Insufficient documentation

## 2024-10-18 DIAGNOSIS — L89301 Pressure ulcer of unspecified buttock, stage 1: Secondary | ICD-10-CM | POA: Insufficient documentation

## 2024-10-18 DIAGNOSIS — Z90722 Acquired absence of ovaries, bilateral: Secondary | ICD-10-CM | POA: Insufficient documentation

## 2024-10-18 DIAGNOSIS — Z9079 Acquired absence of other genital organ(s): Secondary | ICD-10-CM | POA: Insufficient documentation

## 2024-10-18 DIAGNOSIS — I739 Peripheral vascular disease, unspecified: Secondary | ICD-10-CM | POA: Insufficient documentation

## 2024-10-18 DIAGNOSIS — Z90710 Acquired absence of both cervix and uterus: Secondary | ICD-10-CM | POA: Insufficient documentation

## 2024-10-18 DIAGNOSIS — Z5111 Encounter for antineoplastic chemotherapy: Secondary | ICD-10-CM | POA: Insufficient documentation

## 2024-10-18 DIAGNOSIS — C561 Malignant neoplasm of right ovary: Secondary | ICD-10-CM | POA: Insufficient documentation

## 2024-10-18 NOTE — Telephone Encounter (Signed)
 Adding Melissa here so she is aware

## 2024-10-18 NOTE — Telephone Encounter (Signed)
 Called to see how she is doing and spoke with Darlynn and her husband. She denies pain and is just taking tylenol  prn. Denies respiratory symptoms/ fever and feels she does not have covid/flu.  She has chin trembling that started a few days ago. She feels like she is having a hot flash at times and then feels cold. She stopped Tramadol  yesterday at noon. She was taking antiemetic and stopped that yesterday due to not having nausea. Her husband feels that she is just taking too much medicine and has never been sick. They are keeping appt today at 1 pm with GYN.

## 2024-10-18 NOTE — Patient Instructions (Addendum)
 It was good seeing you today. Your vaginal incision is healing well.   Continue with your post-op restrictions of no heavy lifting over 10 lbs, pushing, pulling, straining for 6 weeks and nothing in the vagina for 12 weeks.  We will plan on seeing you in the office after you have completed treatment.  Please call the office for any needs or concerns.  I talked with Dr. Lonn about your symptoms. She is recommending chewing tums 2-3 times a day in case your symptoms are related to low calcium. She does not recommend lab work at this time. I would recommend contacting her office if your symptoms persist.

## 2024-10-18 NOTE — Progress Notes (Unsigned)
 Gynecologic Oncology Return Clinic Visit  Date of Service: 10/18/2024 Referring Provider: Ivonne Mustache, MD  PCP: Duanne Butler DASEN, MD [address] [facility] [address] [place],  [place] [address]  Assessment & Plan: Nina Ramos is a 78 y.o. woman with at least Stage IC1 who is s/p RA-TLH, BSO, right pelvic, para-aortic LAD, omentectomy for staging on 09/14/24.  Postop: - Pt recovering well from surgery and healing appropriately postoperatively - Ongoing postoperative expectations and precautions reviewed. Continue with no lifting >10lbs through 6 weeks postoperatively  Vaginal bleeding: - Cuff intact - Monsel's applied - Cuff check in 2-4 weeks.  Ovarian cancer: - Intraoperative findings and pathology results reviewed. - Recommend adjuvant treatment with carb/tax - Recommend NGS (with Her2, FOLR1, HRD testing) and referral to genetics - Treatment reviewed - Referral to medical oncology - Follow-up after completion of adjuvant treatment. - CA125 is a marker for her   RTC after completion of adjuvant treatment (cuff check with Eleanor Epps, NP in 2-4 weeks).  Hoy Masters, MD Gynecologic Oncology   Medical Decision Making I personally spent  TOTAL 30 minutes face-to-face and non-face-to-face in the care of this patient, which includes all pre, intra, and post visit time on the date of service. The discussion of treatment of ovarian cancer is beyond the scope of routine postoperative care.   ----------------------- Reason for Visit: Postop/treatment discussion  Treatment History: Oncology History  Malignant neoplasm of right ovary (HCC)  08/06/2024 Imaging   CT A/P wo contrast: IMPRESSION: 1. 5.7 cm complex cystic and solid right adnexal mass, highly concerning for ovarian neoplasm. Follow-up pelvic ultrasound is recommended. 2. Moderate ascites. Malignant ascites cannot be excluded given the right adnexal mass described above. Diagnostic paracentesis could be considered  for cytologic analysis. 3. Trace right pleural effusion. 4. Gallbladder sludge versus small layering gallstones. No evidence of acute cholecystitis. 5.  Aortic Atherosclerosis (ICD10-I70.0).  Pelvic ultrasound: IMPRESSION: 1. Suspected right adnexal mass on CT is not sonographically visualized. This remains suspicious for ovarian neoplasm when correlating with that study. 2. Large volume pelvic ascites.   08/08/2024 Pathology Results   A. ASCITES, PARACENTESIS:  FINAL MICROSCOPIC DIAGNOSIS:  - No malignant cells identified  - Numerous benign/reactive mesothelial cells with acute inflammatory  cells    08/08/2024 Tumor Marker   CA125: 127   08/08/2024 Tumor Marker   Patient's tumor was tested for the following markers: CA-125. Results of the tumor marker test revealed 127.   08/16/2024 Imaging   CT c/a/p: IMPRESSION: 1. Large, mixed solid and cystic mass of the right ovary, measuring 7.4 x 5.3 x 4.3 cm. There is a thick rind of soft tissue at the right aspect of the lesion and predominantly cystic composition of the left aspect of the lesion. Findings are most consistent with primary ovarian malignancy. 2. Trace ascites in the low pelvis, significantly diminished compared to prior examination with intervening diagnostic paracentesis. This is presumed malignant although there is no directly visible peritoneal thickening or nodularity. 3. No directly visualized evidence of lymphadenopathy or metastatic disease in the chest, abdomen, or pelvis. 4. Small right, trace left pleural effusions. 5. Clustered centrilobular and tree-in-bud nodules in the lingula, most consistent with atypical mycobacterial infection. No suspicious pulmonary nodules. 6. Emphysema. 7. Coronary artery disease.   09/14/2024 Surgery   Procedures: Robotic-assisted total laparoscopic hysterectomy, bilateral salpingo-oophorectomy, right pelvic and para-aortic lymphadenectomy, omentectomy   Findings: On  entry to abdomen, normal upper abdominal survey with smooth diaphragm, liver, stomach and normal appearing omentum  and bowel.  No evidence of carcinomatosis.  In the pelvis, 8 to 10 cm cystic and solid right ovarian mass.  Otherwise small normal-appearing uterus.  Normal left ovary and bilateral fallopian tubes.  Small volume ascites in the pelvis.  With minimal manipulation of mass, and incidental rupture occurred. Of note, washings/ascites collected after cyst rupture with attempt to avoid fluid in the upper abdomen where cyst fluid drained. IOFS c/w cancer, not further classified at this time.    09/14/2024 Initial Diagnosis   Malignant neoplasm of right ovary (HCC)   09/14/2024 Pathology Results   A. 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 - Uterus with inactive endometrium and benign endocervical polyp. - Left ovary with no specific pathologic change. - Left fallopian tube with salpingitis isthmica nodosa and benign paratubal cyst.  Comment: A representative slide was seen in peer review by Dr. Janel, who concurs with the diagnosis of malignancy.  B. RIGHT PELVIC LYMPH NODES: - Six lymph nodes, all negative for carcinoma (0/6).  C. RIGHT PERI AORTIC LYMPH NODE: - Three lymph nodes, all negative for carcinoma (0/3).  D. OMENTUM: - Fibroadipose tissue, negative for carcinoma.  CASE SUMMARY: (OVARY or FALLOPIAN TUBE or PRIMARY PERITONEUM) Standard(s): AJCC-UICC 8, FIGO Cancer Report 2021  SPECIMEN Procedure: Total hysterectomy and bilateral salpingo-oophorectomy, omentectomy Specimen Integrity: - Right ovary integrity: Capsule intact - Left ovary integrity: Capsule intact - Right fallopian tube integrity: Serosa intact - Left fallopian tube integrity: Serosa intact  TUMOR Tumor Site: Right ovary Tumor Size: Greatest dimension 11.2 cm Histologic  Type: High-grade serous carcinoma Histologic Grade: Not applicable Ovarian Surface Involvement: Not identified Fallopian Tube Surface Involvement: Not identified Implants: Not applicable Other Tissue/ Organ Involvement: Right fallopian tube Largest Extrapelvic Peritoneal Focus: Not applicable Peritoneal/Ascitic Fluid Involvement: Results pending Chemotherapy Response Score (CRS): No known presurgical therapy  REGIONAL LYMPH NODES Regional Lymph Nodes Status: All regional lymph nodes negative for tumor cells - Number of lymph nodes examined: 9  DISTANT METASTASIS Distant Site(s) Involved, if applicable: Not applicable  pTNM CLASSIFICATION (AJCC 8th Edition): Modified Classification: Not applicable pT Category: pT1a T suffix: Not applicable pN Category: pN0 N Suffix: Not applicable pM - Not applicable  FIGO STAGE (2021 FIGO Cancer Report) FIGO Stage: IA    09/14/2024 Pathology Results   Cytology: FINAL MICROSCOPIC DIAGNOSIS:  - Atypical cells present    09/14/2024 Cancer Staging   Staging form: Ovary, Fallopian Tube, and Primary Peritoneal Carcinoma, AJCC 8th Edition - Clinical stage from 09/14/2024: FIGO Stage IC1, calculated as Stage IC (cT1c1, cN0, cM0) - Signed by Eldonna Mays, MD on 09/27/2024 Histopathologic type: Serous cystadenocarcinoma, NOS Histologic grade (G): G3 Histologic grading system: 4 grade system   10/06/2024 Procedure   Ultrasound and fluoroscopically guided right internal jugular single lumen power port catheter insertion. Tip in the SVC/RA junction. Catheter ready for use.   10/07/2024 Tumor Marker   Patient's tumor was tested for the following markers: CA-125. Results of the tumor marker test revealed 31.3.   10/12/2024 -  Chemotherapy   Patient is on Treatment Plan : OVARIAN Carboplatin  (AUC 6) + Paclitaxel  (175) q21d X 6 Cycles       Interval History: Patient presents today to the office for vaginal cuff assessment.  She reports having  multiple symptoms after recent chemotherapy.  She states her chin has been quivering for the past 2-1/2 days.  She has difficulty  drinking and eating due to this.  She feels her throat is swollen and she has difficulty swallowing larger pieces of food.  She also reports having a metallic taste in her mouth.  No fever.  Positive for chills then alternating with feeling significantly hot.  No nausea or emesis.  No pain at this time.  She has been taking MiraLAX  and feels she may need to increase this since it has been several days since she had a bowel movement.  She is wondering if the chemotherapy is too strong.  She states that she stopped all medications as of noon yesterday to see if her symptoms will improve.  No vaginal bleeding or discharge reported.  No issues voiced with urination.  She does report neuropathy symptoms in both feet.  Preoperatively she does report having shaking of the hands intermittently but the chin quivering is new.  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   Neuromuscular disorder (HCC)    neuropathy feet and legs   Neuropathy 10/19/2018   Osteoporosis    Pneumonia     Past Surgical History:  Procedure Laterality Date   IR IMAGING GUIDED PORT INSERTION  10/06/2024   LAPAROSCOPIC TOTAL PELVIC LYMPHADENECTOMY Right 09/14/2024   Procedure: LAPAROSCOPIC RIGHT PELVIC AND PARA-AORTIC LYMPHADENECTOMY, OMENTECTOMY;  Surgeon: Eldonna Mays, MD;  Location: [facility];  Service: Gynecology;  Laterality: Right;   NO PAST SURGERIES     ROBOTIC ASSISTED TOTAL HYSTERECTOMY WITH BILATERAL SALPINGO OOPHERECTOMY Bilateral 09/14/2024   Procedure: ROBOTIC ASSISTED TOTAL LAPAROSCOPIC HYSTERECTOMY WITH BILATERAL SALPINGO-OOPHORECTOMY;  Surgeon: Eldonna Mays,  MD;  Location: [facility];  Service: Gynecology;  Laterality: Bilateral;  POSSIBLE STAGING, POSSIBLE LAPAROTOMY    Family History  Problem Relation Age of Onset   Asthma Mother    Heart disease Father    Breast cancer Paternal Aunt    Ovarian cancer Neg Hx    Colon cancer Neg Hx    Endometrial cancer Neg Hx     Social History   Socioeconomic History   Marital status: Married    Spouse name: Not on file   Number of children: Not on file   Years of education: Not on file   Highest education level: Not on file  Occupational History   Not on file  Tobacco Use   Smoking status: Former    Passive exposure: Never   Smokeless tobacco: Former    Quit date: 02/05/1994   Tobacco comments:    78 years old to 56 or 74 yr. old  Vaping Use   Vaping status: Never Used  Substance and Sexual Activity   Alcohol use: Never   Drug use: Never   Sexual activity: Not Currently  Other Topics Concern   Not on file  Social History Narrative   Not on file   Social Drivers of Health   Financial Resource Strain: Low Risk  (05/29/2023)   Overall Financial Resource Strain (CARDIA)    Difficulty of Paying Living Expenses: Not hard at all  Food Insecurity: No Food Insecurity (10/12/2024)   Hunger Vital Sign    Worried About Running Out of Food in the Last Year: Never true    Ran Out of Food in the Last Year: Never true  Transportation Needs: No Transportation Needs (  10/12/2024)   PRAPARE - Administrator, Civil Service (Medical): No    Lack of Transportation (Non-Medical): No  Physical Activity: Inactive (09/22/2024)   Exercise Vital Sign    Days of Exercise per Week: 0 days    Minutes of Exercise per Session: 0 min  Stress: No Stress Concern Present (09/22/2024)   [employer] of Occupational Health - Occupational Stress Questionnaire    Feeling of Stress: Only a little  Social Connections: Moderately Integrated (09/22/2024)   Social Connection and Isolation Panel    Frequency of  Communication with Friends and Family: More than three times a week    Frequency of Social Gatherings with Friends and Family: More than three times a week    Attends Religious Services: 1 to 4 times per year    Active Member of [employer] or Organizations: No    Attends Banker Meetings: Never    Marital Status: Married    Current Medications:  Current Outpatient Medications:    acetaminophen  (TYLENOL ) 325 MG tablet, Take 650 mg by mouth every 6 (six) hours as needed for moderate pain (pain score 4-6) or headache., Disp: , Rfl:    alendronate  (FOSAMAX ) 70 MG tablet, Take 70 mg by mouth once a week. Take with a full glass of water  on an empty stomach., Disp: , Rfl:    dexamethasone  (DECADRON ) 4 MG tablet, Take 2 tabs at the night before and 2 tab the morning of chemotherapy, every 3 weeks, by mouth x 6 cycles, Disp: 24 tablet, Rfl: 6   lidocaine -prilocaine  (EMLA ) cream, Apply to affected area once, Disp: 30 g, Rfl: 3   ondansetron  (ZOFRAN ) 8 MG tablet, Take 1 tablet (8 mg total) by mouth every 8 (eight) hours as needed for nausea or vomiting. Start on the third day after carboplatin ., Disp: 30 tablet, Rfl: 1   polyethylene glycol (MIRALAX  / GLYCOLAX ) 17 g packet, Take 17 g by mouth daily., Disp: , Rfl:    prochlorperazine  (COMPAZINE ) 10 MG tablet, Take 1 tablet (10 mg total) by mouth every 6 (six) hours as needed for nausea or vomiting., Disp: 30 tablet, Rfl: 1   traMADol  (ULTRAM ) 50 MG tablet, Take 1 tablet (50 mg total) by mouth every 6 (six) hours as needed., Disp: 30 tablet, Rfl: 0  Review of Symptoms: + for changes in appetite, constipation, chills, back pain, numbness in legs and feet, anxiety, fatigue, muscle pain, depression, ringing in the ears-not new-, hot flashes, confusion.  Physical Exam: BP (!) 142/68 Comment: manual recheck, NP aware  Pulse 95   Temp 97.9 F (36.6 C) (Oral)   Resp 19   Wt 124 lb (56.2 kg)   SpO2 99%   BMI 20.01 kg/m  General: Alert, oriented,  no acute distress. HEENT: Normocephalic, atraumatic. Neck symmetric without masses. Sclera anicteric.  Chest: Normal work of breathing. Clear to auscultation bilaterally.   Cardiovascular: Regular rate and rhythm, no murmurs. Abdomen: Soft, nontender.  Normoactive bowel sounds.  No masses appreciated.  Well-healing incisions. Dressing removed. Glue on incisions. Extremities: Grossly normal range of motion.  Warm, well perfused.  No edema bilaterally. Skin: No rashes or lesions noted. GU: Normal appearing external genitalia without erythema, excoriation, or lesions.  Speculum exam reveals clot at vaginal cuff. Clot removed and cuff intact with raw friable vaginal tissue particularly at right corner. Monsel's solution applied. Bimanual exam reveals intact vaginal cuff. Exam chaperoned by Kimberly Jordan, CMA   Laboratory & Radiologic Studies: Surgical pathology (09/14/24): A.  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 - Uterus with inactive endometrium and benign endocervical polyp. - Left ovary with no specific pathologic change. - Left fallopian tube with salpingitis isthmica nodosa and benign paratubal cyst.  Comment: A representative slide was seen in peer review by Dr. Janel, who concurs with the diagnosis of malignancy.  B. RIGHT PELVIC LYMPH NODES: - Six lymph nodes, all negative for carcinoma (0/6).  C. RIGHT PERI AORTIC LYMPH NODE: - Three lymph nodes, all negative for carcinoma (0/3).  D. OMENTUM: - Fibroadipose tissue, negative for carcinoma.  CASE SUMMARY: (OVARY or FALLOPIAN TUBE or PRIMARY PERITONEUM) Standard(s): AJCC-UICC 8, FIGO Cancer Report 2021  SPECIMEN Procedure: Total hysterectomy and bilateral salpingo-oophorectomy, omentectomy Specimen Integrity: - Right ovary integrity: Capsule intact - Left ovary integrity: Capsule intact -  Right fallopian tube integrity: Serosa intact - Left fallopian tube integrity: Serosa intact  TUMOR Tumor Site: Right ovary Tumor Size: Greatest dimension 11.2 cm Histologic Type: High-grade serous carcinoma Histologic Grade: Not applicable Ovarian Surface Involvement: Not identified Fallopian Tube Surface Involvement: Not identified Implants: Not applicable Other Tissue/ Organ Involvement: Right fallopian tube Largest Extrapelvic Peritoneal Focus: Not applicable Peritoneal/Ascitic Fluid Involvement: Results pending Chemotherapy Response Score (CRS): No known presurgical therapy  REGIONAL LYMPH NODES Regional Lymph Nodes Status: All regional lymph nodes negative for tumor cells - Number of lymph nodes examined: 9  DISTANT METASTASIS Distant Site(s) Involved, if applicable: Not applicable  pTNM CLASSIFICATION (AJCC 8th Edition): Modified Classification: Not applicable pT Category: pT1a T suffix: Not applicable pN Category: pN0 N Suffix: Not applicable pM - Not applicable  FIGO STAGE (2021 FIGO Cancer Report) FIGO Stage: IA  SPECIAL STUDIES: Not performed   Cytology (09/14/24): FINAL MICROSCOPIC DIAGNOSIS:  - Atypical cells present

## 2024-10-18 NOTE — Telephone Encounter (Signed)
 Nina Ramos,  Can you call and check on her?

## 2024-10-21 ENCOUNTER — Encounter: Payer: Self-pay | Admitting: Hematology and Oncology

## 2024-10-22 ENCOUNTER — Inpatient Hospital Stay (HOSPITAL_BASED_OUTPATIENT_CLINIC_OR_DEPARTMENT_OTHER): Admitting: Hematology and Oncology

## 2024-10-22 ENCOUNTER — Telehealth: Payer: Self-pay

## 2024-10-22 ENCOUNTER — Encounter: Payer: Self-pay | Admitting: Hematology and Oncology

## 2024-10-22 VITALS — BP 147/58 | HR 92 | Temp 97.6°F | Resp 18 | Ht 66.0 in | Wt 123.2 lb

## 2024-10-22 DIAGNOSIS — Z9079 Acquired absence of other genital organ(s): Secondary | ICD-10-CM | POA: Diagnosis not present

## 2024-10-22 DIAGNOSIS — C561 Malignant neoplasm of right ovary: Secondary | ICD-10-CM

## 2024-10-22 DIAGNOSIS — Z90722 Acquired absence of ovaries, bilateral: Secondary | ICD-10-CM | POA: Diagnosis not present

## 2024-10-22 DIAGNOSIS — I739 Peripheral vascular disease, unspecified: Secondary | ICD-10-CM | POA: Diagnosis not present

## 2024-10-22 DIAGNOSIS — G620 Drug-induced polyneuropathy: Secondary | ICD-10-CM | POA: Diagnosis not present

## 2024-10-22 DIAGNOSIS — G629 Polyneuropathy, unspecified: Secondary | ICD-10-CM | POA: Diagnosis not present

## 2024-10-22 DIAGNOSIS — L89301 Pressure ulcer of unspecified buttock, stage 1: Secondary | ICD-10-CM | POA: Diagnosis not present

## 2024-10-22 DIAGNOSIS — M2669 Other specified disorders of temporomandibular joint: Secondary | ICD-10-CM | POA: Diagnosis not present

## 2024-10-22 DIAGNOSIS — Z5111 Encounter for antineoplastic chemotherapy: Secondary | ICD-10-CM | POA: Diagnosis present

## 2024-10-22 DIAGNOSIS — Z90710 Acquired absence of both cervix and uterus: Secondary | ICD-10-CM | POA: Diagnosis not present

## 2024-10-22 DIAGNOSIS — F419 Anxiety disorder, unspecified: Secondary | ICD-10-CM | POA: Diagnosis not present

## 2024-10-22 DIAGNOSIS — L89101 Pressure ulcer of unspecified part of back, stage 1: Secondary | ICD-10-CM | POA: Diagnosis not present

## 2024-10-22 MED ORDER — ALPRAZOLAM 0.25 MG PO TABS: 0.2500 mg | ORAL_TABLET | Freq: Every evening | ORAL | 0 refills | Status: DC | PRN

## 2024-10-22 NOTE — Assessment & Plan Note (Addendum)
 She has signs of tremor/jaw claudication which is unusual side effects of treatment It could be due to hypocalcemia versus anxiety Recommend high-dose vitamin D and calcium supplement I recommend the patient to hold off taking Fosamax  Recommend a trial of very low-dose Xanax  to take as needed to see if this is anxiety driven Side effects were fully discussed with the patient and her daughter and they agreed to proceed

## 2024-10-22 NOTE — Assessment & Plan Note (Addendum)
 The patient was discovered to have incidental pelvic mass in September when she was admitted for abdominal pain, leading to subsequent evaluation and surgery Her initial presentation revealed signs of ascites but cytology was negative She is recovering well from surgery and denies abdominal pain now We discussed her pathology report; fluid cytology showed atypical cell and final pathology revealed source of cancer from the right ovary  She received cycle 1 of chemotherapy approximately 2 weeks ago, complicated by severe bone pain, intermittent sensation of neuropathy, poor healing wound, anxiety and jaw claudication Based on side effects, I will reduce the dose of paclitaxel  in the future

## 2024-10-22 NOTE — Assessment & Plan Note (Addendum)
 She had recent weight loss and has developed stage I decubitus ulcer at the buttock region We discussed importance of high-protein diet I gave her a flowsheet to document her oral intake for the next 10 days

## 2024-10-22 NOTE — Progress Notes (Signed)
 [facility] OFFICE PROGRESS NOTE  Patient Care Team: Duanne Butler DASEN, MD as PCP - General (Family Medicine) [facility], [place]  Assessment & Plan Malignant neoplasm of right ovary [facility]) The patient was discovered to have incidental pelvic mass in September when she was admitted for abdominal pain, leading to subsequent evaluation and surgery Her initial presentation revealed signs of ascites but cytology was negative She is recovering well from surgery and denies abdominal pain now We discussed her pathology report; fluid cytology showed atypical cell and final pathology revealed source of cancer from the right ovary  She received cycle 1 of chemotherapy approximately 2 weeks ago, complicated by severe bone pain, intermittent sensation of neuropathy, poor healing wound, anxiety and jaw claudication Based on side effects, I will reduce the dose of paclitaxel  in the future Neuropathy She has developed neuropathy from treatment I plan to reduce the dose of paclitaxel  as above Decubitus ulcer of back, stage 1 She had recent weight loss and has developed stage I decubitus ulcer at the buttock region We discussed importance of high-protein diet I gave her a flowsheet to document her oral intake for the next 10 days Jaw claudication She has signs of tremor/jaw claudication which is unusual side effects of treatment It could be due to hypocalcemia versus anxiety Recommend high-dose vitamin D and calcium supplement I recommend the patient to hold off taking Fosamax  Recommend a trial of very low-dose Xanax  to take as needed to see if this is anxiety driven Side effects were fully discussed with the patient and her daughter and they agreed to proceed  No orders of the defined types were placed in this encounter.    Nina Bedford, MD  INTERVAL HISTORY: she returns for surveillance follow-up after recent chemotherapy She has developed numerous side effects Within 48  to 72 hours, she developed diffuse bone pain that subsequently resolved with tramadol  Then she started to develop intermittent stabbing pain especially in her feet consistent with neuropathy We discussed risk and benefits of trial of gabapentin  but she declined for now She also have developed a bedsore at the buttock region She has lost weight She also have tremor in her jaw/claudication nonstop over the past few days There is a component of anxiety We have extensive discussions about management of side effects today  PHYSICAL EXAMINATION: ECOG PERFORMANCE STATUS: 1 - Symptomatic but completely ambulatory  Vitals:   10/22/24 1427  BP: (!) 147/58  Pulse: 92  Resp: 18  Temp: 97.6 F (36.4 C)  SpO2: 99%   Filed Weights   10/22/24 1427  Weight: 123 lb 3.2 oz (55.9 kg)

## 2024-10-22 NOTE — Assessment & Plan Note (Addendum)
 She has developed neuropathy from treatment I plan to reduce the dose of paclitaxel  as above

## 2024-10-22 NOTE — Telephone Encounter (Signed)
 Called and given appt today at 2:40 pm with Dr. Lonn to discuss mychart message. Daughter is aware of appt.

## 2024-10-25 ENCOUNTER — Encounter: Payer: Self-pay | Admitting: Hematology and Oncology

## 2024-10-29 MED FILL — Fosaprepitant Dimeglumine For IV Infusion 150 MG (Base Eq): INTRAVENOUS | Qty: 5 | Status: AC

## 2024-11-01 ENCOUNTER — Inpatient Hospital Stay: Admitting: Hematology and Oncology

## 2024-11-01 ENCOUNTER — Inpatient Hospital Stay

## 2024-11-01 ENCOUNTER — Encounter: Payer: Self-pay | Admitting: Hematology and Oncology

## 2024-11-01 VITALS — BP 129/70 | HR 91 | Temp 97.8°F | Resp 18 | Ht 66.0 in | Wt 127.4 lb

## 2024-11-01 VITALS — BP 141/81 | HR 88 | Temp 98.0°F | Resp 18

## 2024-11-01 DIAGNOSIS — L89101 Pressure ulcer of unspecified part of back, stage 1: Secondary | ICD-10-CM | POA: Diagnosis not present

## 2024-11-01 DIAGNOSIS — K1231 Oral mucositis (ulcerative) due to antineoplastic therapy: Secondary | ICD-10-CM | POA: Insufficient documentation

## 2024-11-01 DIAGNOSIS — C561 Malignant neoplasm of right ovary: Secondary | ICD-10-CM

## 2024-11-01 DIAGNOSIS — G629 Polyneuropathy, unspecified: Secondary | ICD-10-CM | POA: Diagnosis not present

## 2024-11-01 DIAGNOSIS — Z5111 Encounter for antineoplastic chemotherapy: Secondary | ICD-10-CM | POA: Diagnosis not present

## 2024-11-01 LAB — CMP (CANCER CENTER ONLY)
ALT: 42 U/L (ref 0–44)
AST: 30 U/L (ref 15–41)
Albumin: 4.2 g/dL (ref 3.5–5.0)
Alkaline Phosphatase: 60 U/L (ref 38–126)
Anion gap: 10 (ref 5–15)
BUN: 25 mg/dL — ABNORMAL HIGH (ref 8–23)
CO2: 25 mmol/L (ref 22–32)
Calcium: 9.1 mg/dL (ref 8.9–10.3)
Chloride: 108 mmol/L (ref 98–111)
Creatinine: 0.79 mg/dL (ref 0.44–1.00)
GFR, Estimated: >60 mL/min (ref >60)
Glucose, Bld: 194 mg/dL — ABNORMAL HIGH (ref 70–99)
Potassium: 4 mmol/L (ref 3.5–5.1)
Sodium: 142 mmol/L (ref 135–145)
Total Bilirubin: 0.2 mg/dL (ref 0.0–1.2)
Total Protein: 6.7 g/dL (ref 6.5–8.1)

## 2024-11-01 LAB — CBC WITH DIFFERENTIAL (CANCER CENTER ONLY)
Abs Immature Granulocytes: 0.09 K/uL — ABNORMAL HIGH (ref 0.00–0.07)
Basophils Absolute: 0 K/uL (ref 0.0–0.1)
Basophils Relative: 0 %
Eosinophils Absolute: 0 K/uL (ref 0.0–0.5)
Eosinophils Relative: 0 %
HCT: 35.3 % — ABNORMAL LOW (ref 36.0–46.0)
Hemoglobin: 12 g/dL (ref 12.0–15.0)
Immature Granulocytes: 2 %
Lymphocytes Relative: 19 %
Lymphs Abs: 0.9 K/uL (ref 0.7–4.0)
MCH: 32.1 pg (ref 26.0–34.0)
MCHC: 34 g/dL (ref 30.0–36.0)
MCV: 94.4 fL (ref 80.0–100.0)
Monocytes Absolute: 0.2 K/uL (ref 0.1–1.0)
Monocytes Relative: 5 %
Neutro Abs: 3.3 K/uL (ref 1.7–7.7)
Neutrophils Relative %: 74 %
Platelet Count: 206 K/uL (ref 150–400)
RBC: 3.74 MIL/uL — ABNORMAL LOW (ref 3.87–5.11)
RDW: 13.6 % (ref 11.5–15.5)
WBC Count: 4.5 K/uL (ref 4.0–10.5)
nRBC: 0 % (ref 0.0–0.2)

## 2024-11-01 MED ORDER — SODIUM CHLORIDE 0.9 % IV SOLN: INTRAVENOUS | Status: DC

## 2024-11-01 MED ORDER — PALONOSETRON HCL INJECTION 0.25 MG/5ML
0.2500 mg | Freq: Once | INTRAVENOUS | Status: AC
  Administered 2024-11-01: 10:00:00 0.25 mg via INTRAVENOUS
  Filled 2024-11-01: qty 5

## 2024-11-01 MED ORDER — SODIUM CHLORIDE 0.9 % IV SOLN
140.0000 mg/m2 | Freq: Once | INTRAVENOUS | Status: AC
  Administered 2024-11-01: 12:00:00 228 mg via INTRAVENOUS
  Filled 2024-11-01: qty 38

## 2024-11-01 MED ORDER — SODIUM CHLORIDE 0.9 % IV SOLN
150.0000 mg | Freq: Once | INTRAVENOUS | Status: AC
  Administered 2024-11-01: 11:00:00 150 mg via INTRAVENOUS
  Filled 2024-11-01: qty 150

## 2024-11-01 MED ORDER — CETIRIZINE HCL 10 MG/ML IV SOLN
10.0000 mg | Freq: Once | INTRAVENOUS | Status: AC
  Administered 2024-11-01: 11:00:00 10 mg via INTRAVENOUS
  Filled 2024-11-01: qty 1

## 2024-11-01 MED ORDER — FAMOTIDINE IN NACL 20-0.9 MG/50ML-% IV SOLN
20.0000 mg | Freq: Once | INTRAVENOUS | Status: AC
  Administered 2024-11-01: 11:00:00 20 mg via INTRAVENOUS
  Filled 2024-11-01: qty 50

## 2024-11-01 MED ORDER — DEXAMETHASONE SOD PHOSPHATE PF 10 MG/ML IJ SOLN
10.0000 mg | Freq: Once | INTRAMUSCULAR | Status: AC
  Administered 2024-11-01: 10:00:00 10 mg via INTRAVENOUS

## 2024-11-01 MED ORDER — SODIUM CHLORIDE 0.9 % IV SOLN
326.5000 mg | Freq: Once | INTRAVENOUS | Status: AC
  Administered 2024-11-01: 15:00:00 330 mg via INTRAVENOUS
  Filled 2024-11-01: qty 33

## 2024-11-01 NOTE — Progress Notes (Signed)
 [facility] OFFICE PROGRESS NOTE  Ramos Care Team: Duanne Butler DASEN, MD as PCP - General (Family Medicine) [facility], [place]  Assessment & Plan Malignant neoplasm of right ovary [facility]) Nina Ramos was discovered to have incidental pelvic mass in September when she was admitted for abdominal pain, leading to subsequent evaluation and surgery Her initial presentation revealed signs of ascites but cytology was negative She is recovering well from surgery and denies abdominal pain now We discussed her pathology report; fluid cytology showed atypical cell and final pathology revealed source of cancer from Nina right ovary  She received cycle 1 of chemotherapy approximately 2 weeks ago, complicated by severe bone pain, intermittent sensation of neuropathy, poor healing wound, anxiety and jaw claudication Based on side effects, I will reduce Nina dose of paclitaxel  in Nina future From our recent evaluation to now, she has also developed mucositis Plan to also reduce Nina dose of carboplatin  We discussed Nina importance of close communication for management of side effects Neuropathy She has developed neuropathy from treatment I plan to reduce Nina dose of paclitaxel  as above Mucositis due to antineoplastic therapy She has mild mucositis In addition to reducing Nina dose of treatment, I recommend conservative approach with baking soda mixed with salt water  gargle Decubitus ulcer of back, stage 1 This has healed We discussed importance of mobility and adequate protein intake  No orders of Nina defined types were placed in this encounter.    Nina Bedford, MD  INTERVAL HISTORY: she returns for treatment follow-up Complications related to previous cycle of chemotherapy included mucositis,, peripheral neuropathy,, and jaw claudication Her jaw claudication is less Her decubitus ulcer has healed She forgot to mention about mucositis from a last visit It is not prohibiting  her from eating and drinking  PHYSICAL EXAMINATION: ECOG PERFORMANCE STATUS: 1 - Symptomatic but completely ambulatory  Lab Results  Component Value Date   CAN125 31.3 10/05/2024   CAN125 127.0 (H) 08/08/2024      Latest Ref Rng & Units 11/01/2024    9:03 AM 10/11/2024    2:36 PM 10/05/2024   10:32 AM  CBC  WBC 4.0 - 10.5 K/uL 4.5  4.0  3.4   Hemoglobin 12.0 - 15.0 g/dL 87.9  87.8  87.3   Hematocrit 36.0 - 46.0 % 35.3  36.0  38.7   Platelets 150 - 400 K/uL 206  226  290       Chemistry      Component Value Date/Time   NA 142 11/01/2024 0903   K 4.0 11/01/2024 0903   CL 108 11/01/2024 0903   CO2 25 11/01/2024 0903   BUN 25 (H) 11/01/2024 0903   CREATININE 0.79 11/01/2024 0903   CREATININE 0.76 10/05/2024 1032      Component Value Date/Time   CALCIUM 9.1 11/01/2024 0903   ALKPHOS 60 11/01/2024 0903   AST 30 11/01/2024 0903   ALT 42 11/01/2024 0903   BILITOT 0.2 11/01/2024 0903       Vitals:   11/01/24 0943  BP: 129/70  Pulse: 91  Resp: 18  Temp: 97.8 F (36.6 C)  SpO2: 100%   Filed Weights   11/01/24 0943  Weight: 127 lb 6.4 oz (57.8 kg)   Other relevant data reviewed during this visit included cbc and cmp

## 2024-11-01 NOTE — Patient Instructions (Addendum)
 [facility] - A DEPT OF [facility]  Discharge Instructions: Thank you for choosing [facility] to provide your oncology and hematology care.   If you have a lab appointment with the Cancer Center, please go directly to the Cancer Center and check in at the registration area.   Wear comfortable clothing and clothing appropriate for easy access to any Portacath or PICC line.   We strive to give you quality time with your provider. You may need to reschedule your appointment if you arrive late (15 or more minutes).  Arriving late affects you and other patients whose appointments are after yours.  Also, if you miss three or more appointments without notifying the office, you may be dismissed from the clinic at the provider's discretion.      For prescription refill requests, have your pharmacy contact our office and allow 72 hours for refills to be completed.    Today you received the following chemotherapy and/or immunotherapy agents: Paclitaxel , Carboplatin .       To help prevent nausea and vomiting after your treatment, we encourage you to take your nausea medication as directed.  BELOW ARE SYMPTOMS THAT SHOULD BE REPORTED IMMEDIATELY: *FEVER GREATER THAN 100.4 F (38 C) OR HIGHER *CHILLS OR SWEATING *NAUSEA AND VOMITING THAT IS NOT CONTROLLED WITH YOUR NAUSEA MEDICATION *UNUSUAL SHORTNESS OF BREATH *UNUSUAL BRUISING OR BLEEDING *URINARY PROBLEMS (pain or burning when urinating, or frequent urination) *BOWEL PROBLEMS (unusual diarrhea, constipation, pain near the anus) TENDERNESS IN MOUTH AND THROAT WITH OR WITHOUT PRESENCE OF ULCERS (sore throat, sores in mouth, or a toothache) UNUSUAL RASH, SWELLING OR PAIN  UNUSUAL VAGINAL DISCHARGE OR ITCHING   Items with * indicate a potential emergency and should be followed up as soon as possible or go to the Emergency Department if any problems should occur.  Please show the CHEMOTHERAPY ALERT CARD  or IMMUNOTHERAPY ALERT CARD at check-in to the Emergency Department and triage nurse.  Should you have questions after your visit or need to cancel or reschedule your appointment, please contact [facility] - A DEPT [facility]  Dept: [phone]  and follow the prompts.  Office hours are 8:00 a.m. to 4:30 p.m. Monday - Friday. Please note that voicemails left after 4:00 p.m. may not be returned until the following business day.  We are closed weekends and major holidays. You have access to a nurse at all times for urgent questions. Please call the main number to the clinic Dept: [phone] and follow the prompts.   For any non-urgent questions, you may also contact your provider using MyChart. We now offer e-Visits for anyone 64 and older to request care online for non-urgent symptoms. For details visit [email or web address].   Also download the MyChart app! Go to the app store, search MyChart, open the app, select [employer], and log in with your MyChart username and password.

## 2024-11-01 NOTE — Assessment & Plan Note (Addendum)
 The patient was discovered to have incidental pelvic mass in September when she was admitted for abdominal pain, leading to subsequent evaluation and surgery Her initial presentation revealed signs of ascites but cytology was negative She is recovering well from surgery and denies abdominal pain now We discussed her pathology report; fluid cytology showed atypical cell and final pathology revealed source of cancer from the right ovary  She received cycle 1 of chemotherapy approximately 2 weeks ago, complicated by severe bone pain, intermittent sensation of neuropathy, poor healing wound, anxiety and jaw claudication Based on side effects, I will reduce the dose of paclitaxel  in the future From our recent evaluation to now, she has also developed mucositis Plan to also reduce the dose of carboplatin  We discussed the importance of close communication for management of side effects

## 2024-11-01 NOTE — Assessment & Plan Note (Addendum)
 She has mild mucositis In addition to reducing the dose of treatment, I recommend conservative approach with baking soda mixed with salt water  gargle

## 2024-11-01 NOTE — Assessment & Plan Note (Addendum)
 She has developed neuropathy from treatment I plan to reduce the dose of paclitaxel  as above

## 2024-11-01 NOTE — Assessment & Plan Note (Addendum)
 This has healed We discussed importance of mobility and adequate protein intake

## 2024-11-02 LAB — CA 125: Cancer Antigen (CA) 125: 14.8 U/mL (ref 0.0–38.1)

## 2024-11-04 ENCOUNTER — Telehealth: Payer: Self-pay

## 2024-11-04 NOTE — Telephone Encounter (Signed)
 I told them before bone aches after treatment could be related to side-effects with bone marrow expansion She was prescribed tramadol  and she can take it with tylenol  round the clock and claritin

## 2024-11-04 NOTE — Telephone Encounter (Signed)
 Called her back and given below message. She verbalized understanding and will take tylenol  every 6 hours and start Claritin 10 mg daily for a few days. She does not want to take Tramadol  and does like the way it makes her feel. She will call the office back for questions/concerns.

## 2024-11-04 NOTE — Telephone Encounter (Signed)
 Called regarding mychart message from daughter: Nina Ramos  This is Nina Ramos, Nina Ramos daughter, my mom has developed some sharp pains in her back near her waist. She describes the pain at a scale of 8. This just started today. Just seeing what we should do??  Her phone number is [phone] or my dads number is [phone] Just concerned. Its pretty bad stabbing pain.  Spoke with Inocente. Before lunch time today she had intermittent sharp pain on the right side of her back near waist line. Denies pain at this time during call. She did take tylenol  earlier today. Denies constipation and will continue laxatives. Last bm yesterday. She is able to eat and drink with no problems.  She is concerned and just wanted Dr. Lonn to be aware. She is asking if Dr. Lonn has any recommendations.

## 2024-11-17 ENCOUNTER — Telehealth: Payer: Self-pay

## 2024-11-17 NOTE — Telephone Encounter (Signed)
 Spoke with the pt (daughter) [name] in regards to her FMLA forms being completed,faxed, and confirmation received. Mrs. Tita copy was emailed to her as requested.

## 2024-11-19 ENCOUNTER — Encounter: Payer: Self-pay | Admitting: Hematology and Oncology

## 2024-11-19 ENCOUNTER — Inpatient Hospital Stay: Attending: Gynecologic Oncology

## 2024-11-19 ENCOUNTER — Telehealth: Payer: Self-pay

## 2024-11-19 ENCOUNTER — Other Ambulatory Visit: Payer: Self-pay

## 2024-11-19 DIAGNOSIS — R300 Dysuria: Secondary | ICD-10-CM

## 2024-11-19 DIAGNOSIS — C561 Malignant neoplasm of right ovary: Secondary | ICD-10-CM | POA: Insufficient documentation

## 2024-11-19 DIAGNOSIS — N390 Urinary tract infection, site not specified: Secondary | ICD-10-CM | POA: Insufficient documentation

## 2024-11-19 DIAGNOSIS — Z5111 Encounter for antineoplastic chemotherapy: Secondary | ICD-10-CM | POA: Insufficient documentation

## 2024-11-19 DIAGNOSIS — Z79899 Other long term (current) drug therapy: Secondary | ICD-10-CM | POA: Insufficient documentation

## 2024-11-19 LAB — URINALYSIS, COMPLETE (UACMP) WITH MICROSCOPIC
Bilirubin Urine: NEGATIVE
Glucose, UA: NEGATIVE mg/dL
Hgb urine dipstick: NEGATIVE
Ketones, ur: NEGATIVE mg/dL
Nitrite: NEGATIVE
Protein, ur: NEGATIVE mg/dL
Specific Gravity, Urine: 1.028 (ref 1.005–1.030)
WBC, UA: >50 WBC/hpf (ref 0–5)
pH: 5 (ref 5.0–8.0)

## 2024-11-19 NOTE — Telephone Encounter (Signed)
 Called daughter regarding mychart message. Offered lab only appt today. Appt added at 2:45 pm. They are aware of appt. If she needs to be seen it will need to be with PCP or urgent care. Per daughter, they will just do lab only today and go to urgent care for worsening symptoms.

## 2024-11-22 ENCOUNTER — Inpatient Hospital Stay

## 2024-11-22 ENCOUNTER — Encounter: Payer: Self-pay | Admitting: Hematology and Oncology

## 2024-11-22 ENCOUNTER — Inpatient Hospital Stay: Admitting: Hematology and Oncology

## 2024-11-22 VITALS — BP 140/55 | HR 85 | Resp 16

## 2024-11-22 VITALS — BP 146/59 | HR 94 | Temp 98.0°F | Resp 18 | Ht 66.0 in | Wt 126.4 lb

## 2024-11-22 DIAGNOSIS — N390 Urinary tract infection, site not specified: Secondary | ICD-10-CM | POA: Insufficient documentation

## 2024-11-22 DIAGNOSIS — G629 Polyneuropathy, unspecified: Secondary | ICD-10-CM

## 2024-11-22 DIAGNOSIS — T451X5A Adverse effect of antineoplastic and immunosuppressive drugs, initial encounter: Secondary | ICD-10-CM | POA: Diagnosis not present

## 2024-11-22 DIAGNOSIS — M2669 Other specified disorders of temporomandibular joint: Secondary | ICD-10-CM

## 2024-11-22 DIAGNOSIS — D701 Agranulocytosis secondary to cancer chemotherapy: Secondary | ICD-10-CM | POA: Diagnosis not present

## 2024-11-22 DIAGNOSIS — C561 Malignant neoplasm of right ovary: Secondary | ICD-10-CM

## 2024-11-22 LAB — CBC WITH DIFFERENTIAL (CANCER CENTER ONLY)
Abs Immature Granulocytes: 0.06 K/uL (ref 0.00–0.07)
Basophils Absolute: 0 K/uL (ref 0.0–0.1)
Basophils Relative: 0 %
Eosinophils Absolute: 0 K/uL (ref 0.0–0.5)
Eosinophils Relative: 0 %
HCT: 36.2 % (ref 36.0–46.0)
Hemoglobin: 12.2 g/dL (ref 12.0–15.0)
Immature Granulocytes: 2 %
Lymphocytes Relative: 23 %
Lymphs Abs: 0.7 K/uL (ref 0.7–4.0)
MCH: 32.2 pg (ref 26.0–34.0)
MCHC: 33.7 g/dL (ref 30.0–36.0)
MCV: 95.5 fL (ref 80.0–100.0)
Monocytes Absolute: 0.2 K/uL (ref 0.1–1.0)
Monocytes Relative: 6 %
Neutro Abs: 2 K/uL (ref 1.7–7.7)
Neutrophils Relative %: 69 %
Platelet Count: 175 K/uL (ref 150–400)
RBC: 3.79 MIL/uL — ABNORMAL LOW (ref 3.87–5.11)
RDW: 15.2 % (ref 11.5–15.5)
WBC Count: 2.9 K/uL — ABNORMAL LOW (ref 4.0–10.5)
nRBC: 0 % (ref 0.0–0.2)

## 2024-11-22 LAB — CMP (CANCER CENTER ONLY)
ALT: 28 U/L (ref 0–44)
AST: 27 U/L (ref 15–41)
Albumin: 4.4 g/dL (ref 3.5–5.0)
Alkaline Phosphatase: 52 U/L (ref 38–126)
Anion gap: 11 (ref 5–15)
BUN: 21 mg/dL (ref 8–23)
CO2: 23 mmol/L (ref 22–32)
Calcium: 9.2 mg/dL (ref 8.9–10.3)
Chloride: 107 mmol/L (ref 98–111)
Creatinine: 0.88 mg/dL (ref 0.44–1.00)
GFR, Estimated: >60 mL/min
Glucose, Bld: 195 mg/dL — ABNORMAL HIGH (ref 70–99)
Potassium: 4.3 mmol/L (ref 3.5–5.1)
Sodium: 142 mmol/L (ref 135–145)
Total Bilirubin: 0.3 mg/dL (ref 0.0–1.2)
Total Protein: 6.8 g/dL (ref 6.5–8.1)

## 2024-11-22 LAB — URINE CULTURE: Culture: 70000 — AB

## 2024-11-22 MED ORDER — SODIUM CHLORIDE 0.9 % IV SOLN
326.5000 mg | Freq: Once | INTRAVENOUS | Status: AC
  Administered 2024-11-22: 330 mg via INTRAVENOUS
  Filled 2024-11-22: qty 33

## 2024-11-22 MED ORDER — ALPRAZOLAM 0.25 MG PO TABS: 0.2500 mg | ORAL_TABLET | Freq: Every evening | ORAL | 0 refills | Status: DC | PRN

## 2024-11-22 MED ORDER — PALONOSETRON HCL INJECTION 0.25 MG/5ML
0.2500 mg | Freq: Once | INTRAVENOUS | Status: AC
  Administered 2024-11-22: 0.25 mg via INTRAVENOUS
  Filled 2024-11-22: qty 5

## 2024-11-22 MED ORDER — SODIUM CHLORIDE 0.9 % IV SOLN
105.0000 mg/m2 | Freq: Once | INTRAVENOUS | Status: AC
  Administered 2024-11-22: 168 mg via INTRAVENOUS
  Filled 2024-11-22: qty 28

## 2024-11-22 MED ORDER — SODIUM CHLORIDE 0.9 % IV SOLN: INTRAVENOUS | Status: DC

## 2024-11-22 MED ORDER — CETIRIZINE HCL 10 MG/ML IV SOLN
10.0000 mg | Freq: Once | INTRAVENOUS | Status: AC
  Administered 2024-11-22: 10 mg via INTRAVENOUS
  Filled 2024-11-22: qty 1

## 2024-11-22 MED ORDER — SODIUM CHLORIDE 0.9 % IV SOLN
150.0000 mg | Freq: Once | INTRAVENOUS | Status: AC
  Administered 2024-11-22: 150 mg via INTRAVENOUS
  Filled 2024-11-22: qty 150

## 2024-11-22 MED ORDER — FAMOTIDINE IN NACL 20-0.9 MG/50ML-% IV SOLN
20.0000 mg | Freq: Once | INTRAVENOUS | Status: AC
  Administered 2024-11-22: 20 mg via INTRAVENOUS
  Filled 2024-11-22: qty 50

## 2024-11-22 MED ORDER — NITROFURANTOIN MONOHYD MACRO 100 MG PO CAPS: 100.0000 mg | ORAL_CAPSULE | Freq: Two times a day (BID) | ORAL | 0 refills | Status: AC

## 2024-11-22 MED ORDER — DEXAMETHASONE SOD PHOSPHATE PF 10 MG/ML IJ SOLN
10.0000 mg | Freq: Once | INTRAMUSCULAR | Status: AC
  Administered 2024-11-22: 10 mg via INTRAVENOUS

## 2024-11-22 NOTE — Assessment & Plan Note (Addendum)
 Neutrophil count is adequate We will proceed with treatment without delay

## 2024-11-22 NOTE — Progress Notes (Signed)
 [facility] OFFICE PROGRESS NOTE  Patient Care Team: Duanne Butler DASEN, MD as PCP - General (Family Medicine) [facility], [place]  Assessment & Plan Malignant neoplasm of right ovary [facility]) The patient was discovered to have incidental pelvic mass in September when she was admitted for abdominal pain, leading to subsequent evaluation and surgery Her initial presentation revealed signs of ascites but cytology was negative She is recovering well from surgery and denies abdominal pain now We discussed her pathology report; fluid cytology showed atypical cell and final pathology revealed source of cancer from the right ovary  Cycle 1 of chemotherapy was complicated by severe bone pain, intermittent sensation of neuropathy, poor healing wound, anxiety and jaw claudication Cycle 2 of treatment was complicated by infection, neuropathy, anxiety and mucositis We will proceed with cycle 3 with dose adjustment Moving forward, I also recommend adding in additional 1 week of recovery period For her Neuropathy she has mild peripheral neuropathy, likely related to side effects of treatment. I plan to reduce the dose of treatment as outlined above.  I explained to the patient the rationale of this strategy and reassured the patient it would not compromise the efficacy of treatment  Jaw claudication She has signs of tremor/jaw claudication which is unusual side effects of treatment It could be due to hypocalcemia versus anxiety Recommend high-dose vitamin D and calcium supplement I recommend the patient to hold off taking Fosamax  Recommend a trial of very low-dose Xanax  to take as needed to see if this is anxiety driven That was helpful, I refilled her Xanax  prescription Urinary tract infection without hematuria, site unspecified I reviewed culture results and prescribed a course of antibiotics We do not need to hold treatment for this Leukopenia due to antineoplastic  chemotherapy Neutrophil count is adequate We will proceed with treatment without delay  No orders of the defined types were placed in this encounter.    Nina Bedford, MD  INTERVAL HISTORY: she returns for treatment follow-up Complications related to previous cycle of chemotherapy included leukopenia,, mucositis,, peripheral neuropathy,, and infection,  PHYSICAL EXAMINATION: ECOG PERFORMANCE STATUS: 1 - Symptomatic but completely ambulatory  Lab Results  Component Value Date   CAN125 14.8 11/01/2024   CAN125 31.3 10/05/2024   CAN125 127.0 (H) 08/08/2024      Latest Ref Rng & Units 11/22/2024   10:46 AM 11/01/2024    9:03 AM 10/11/2024    2:36 PM  CBC  WBC 4.0 - 10.5 K/uL 2.9  4.5  4.0   Hemoglobin 12.0 - 15.0 g/dL 87.7  87.9  87.8   Hematocrit 36.0 - 46.0 % 36.2  35.3  36.0   Platelets 150 - 400 K/uL 175  206  226       Chemistry      Component Value Date/Time   NA 142 11/22/2024 1046   K 4.3 11/22/2024 1046   CL 107 11/22/2024 1046   CO2 23 11/22/2024 1046   BUN 21 11/22/2024 1046   CREATININE 0.88 11/22/2024 1046   CREATININE 0.76 10/05/2024 1032      Component Value Date/Time   CALCIUM 9.2 11/22/2024 1046   ALKPHOS 52 11/22/2024 1046   AST 27 11/22/2024 1046   ALT 28 11/22/2024 1046   BILITOT 0.3 11/22/2024 1046       Vitals:   11/22/24 1113  BP: (!) 146/59  Pulse: 94  Resp: 18  Temp: 98 F (36.7 C)  SpO2: 100%   Filed Weights   11/22/24  1113  Weight: 126 lb 6.4 oz (57.3 kg)   Other relevant data reviewed during this visit included CBC, CMP, urine culture result

## 2024-11-22 NOTE — Assessment & Plan Note (Addendum)
 she has mild peripheral neuropathy, likely related to side effects of treatment. I plan to reduce the dose of treatment as outlined above.  I explained to the patient the rationale of this strategy and reassured the patient it would not compromise the efficacy of treatment

## 2024-11-22 NOTE — Assessment & Plan Note (Addendum)
 She has signs of tremor/jaw claudication which is unusual side effects of treatment It could be due to hypocalcemia versus anxiety Recommend high-dose vitamin D and calcium supplement I recommend the patient to hold off taking Fosamax  Recommend a trial of very low-dose Xanax  to take as needed to see if this is anxiety driven That was helpful, I refilled her Xanax  prescription

## 2024-11-22 NOTE — Assessment & Plan Note (Addendum)
 The patient was discovered to have incidental pelvic mass in September when she was admitted for abdominal pain, leading to subsequent evaluation and surgery Her initial presentation revealed signs of ascites but cytology was negative She is recovering well from surgery and denies abdominal pain now We discussed her pathology report; fluid cytology showed atypical cell and final pathology revealed source of cancer from the right ovary  Cycle 1 of chemotherapy was complicated by severe bone pain, intermittent sensation of neuropathy, poor healing wound, anxiety and jaw claudication Cycle 2 of treatment was complicated by infection, neuropathy, anxiety and mucositis We will proceed with cycle 3 with dose adjustment Moving forward, I also recommend adding in additional 1 week of recovery period For her

## 2024-11-22 NOTE — Assessment & Plan Note (Addendum)
 I reviewed culture results and prescribed a course of antibiotics We do not need to hold treatment for this

## 2024-11-22 NOTE — Patient Instructions (Signed)
 [facility] - A DEPT OF [facility]  Discharge Instructions: Thank you for choosing [facility] to provide your oncology and hematology care.   If you have a lab appointment with the Cancer Center, please go directly to the Cancer Center and check in at the registration area.   Wear comfortable clothing and clothing appropriate for easy access to any Portacath or PICC line.   We strive to give you quality time with your provider. You may need to reschedule your appointment if you arrive late (15 or more minutes).  Arriving late affects you and other patients whose appointments are after yours.  Also, if you miss three or more appointments without notifying the office, you may be dismissed from the clinic at the providers discretion.      For prescription refill requests, have your pharmacy contact our office and allow 72 hours for refills to be completed.    Today you received the following chemotherapy and/or immunotherapy agents: Paclitaxel  (Taxol ) and Carboplatin  (Paraplatin )      To help prevent nausea and vomiting after your treatment, we encourage you to take your nausea medication as directed.  BELOW ARE SYMPTOMS THAT SHOULD BE REPORTED IMMEDIATELY: *FEVER GREATER THAN 100.4 F (38 C) OR HIGHER *CHILLS OR SWEATING *NAUSEA AND VOMITING THAT IS NOT CONTROLLED WITH YOUR NAUSEA MEDICATION *UNUSUAL SHORTNESS OF BREATH *UNUSUAL BRUISING OR BLEEDING *URINARY PROBLEMS (pain or burning when urinating, or frequent urination) *BOWEL PROBLEMS (unusual diarrhea, constipation, pain near the anus) TENDERNESS IN MOUTH AND THROAT WITH OR WITHOUT PRESENCE OF ULCERS (sore throat, sores in mouth, or a toothache) UNUSUAL RASH, SWELLING OR PAIN  UNUSUAL VAGINAL DISCHARGE OR ITCHING   Items with * indicate a potential emergency and should be followed up as soon as possible or go to the Emergency Department if any problems should occur.  Please show the  CHEMOTHERAPY ALERT CARD or IMMUNOTHERAPY ALERT CARD at check-in to the Emergency Department and triage nurse.  Should you have questions after your visit or need to cancel or reschedule your appointment, please contact [facility] - [facility]  Dept: [phone]  and follow the prompts.  Office hours are 8:00 a.m. to 4:30 p.m. Monday - Friday. Please note that voicemails left after 4:00 p.m. may not be returned until the following business day.  We are closed weekends and major holidays. You have access to a nurse at all times for urgent questions. Please call the main number to the clinic Dept: [phone] and follow the prompts.   For any non-urgent questions, you may also contact your provider using MyChart. We now offer e-Visits for anyone 95 and older to request care online for non-urgent symptoms. For details visit [email or web address].   Also download the MyChart app! Go to the app store, search MyChart, open the app, select [place], and log in with your MyChart username and password.

## 2024-11-23 LAB — CA 125: Cancer Antigen (CA) 125: 15.3 U/mL (ref 0.0–38.1)

## 2024-11-25 ENCOUNTER — Telehealth: Payer: Self-pay

## 2024-11-25 NOTE — Telephone Encounter (Signed)
 Returned call to husband. They have misplaced the new Xanax  prescription that they picked up Monday. They have x 3 more doses of Xanax  that Nina Ramos takes at night only. They will look again for the prescription tonight. Told them that the office will call back in the am to see if they found the prescription.

## 2024-11-26 ENCOUNTER — Other Ambulatory Visit: Payer: Self-pay | Admitting: Hematology and Oncology

## 2024-11-26 ENCOUNTER — Encounter: Payer: Self-pay | Admitting: Hematology and Oncology

## 2024-11-26 MED ORDER — ALPRAZOLAM 0.25 MG PO TABS: 0.2500 mg | ORAL_TABLET | Freq: Every evening | ORAL | 0 refills | Status: DC | PRN

## 2024-11-26 NOTE — Telephone Encounter (Signed)
 If they picked that up and it went missing, her insurance will not pay for it I can send in another prescription but she will likely have to pay for it Her daughter should probably be managing her prescriptions; since it is controlled substance, I will refill with less pills

## 2024-11-26 NOTE — Telephone Encounter (Signed)
 Called back and spoke with husband to follow up on missing Xanax  prescription. They picked up the prescription and still cannot find the Xanax . Daughter and family have looked everywhere. She has the antibiotic and is taking per the instructions.  Husband is asking what Dr. Lonn recommends?

## 2024-11-26 NOTE — Telephone Encounter (Signed)
Called and given below message to husband. He verbalized understanding. 

## 2024-11-29 ENCOUNTER — Telehealth: Payer: Self-pay | Admitting: Psychiatry

## 2024-11-29 NOTE — Telephone Encounter (Signed)
 Good Morning, courteous notification of patient cancelling genetic counseling referral scheduled for 11/30/2024. She states at this time all her members who did have cancer , had breast cancer and deceased- they were all on her father's side. Her and spouse also agree they are not trying to accumulate any more bills

## 2024-11-30 ENCOUNTER — Inpatient Hospital Stay: Admitting: Genetic Counselor

## 2024-11-30 ENCOUNTER — Inpatient Hospital Stay

## 2024-12-14 ENCOUNTER — Inpatient Hospital Stay

## 2024-12-14 ENCOUNTER — Inpatient Hospital Stay: Admitting: Hematology and Oncology

## 2024-12-17 MED FILL — Fosaprepitant Dimeglumine For IV Infusion 150 MG (Base Eq): INTRAVENOUS | Qty: 5 | Status: AC

## 2024-12-20 ENCOUNTER — Inpatient Hospital Stay

## 2024-12-20 ENCOUNTER — Inpatient Hospital Stay: Admitting: Hematology and Oncology

## 2024-12-21 ENCOUNTER — Inpatient Hospital Stay

## 2024-12-21 ENCOUNTER — Inpatient Hospital Stay: Admitting: Hematology and Oncology

## 2024-12-21 ENCOUNTER — Inpatient Hospital Stay: Attending: Gynecologic Oncology

## 2024-12-21 VITALS — BP 142/70 | HR 82 | Temp 97.8°F | Resp 16 | Ht 66.0 in | Wt 122.2 lb

## 2024-12-21 DIAGNOSIS — C561 Malignant neoplasm of right ovary: Secondary | ICD-10-CM

## 2024-12-21 DIAGNOSIS — M2669 Other specified disorders of temporomandibular joint: Secondary | ICD-10-CM

## 2024-12-21 DIAGNOSIS — T451X5A Adverse effect of antineoplastic and immunosuppressive drugs, initial encounter: Secondary | ICD-10-CM

## 2024-12-21 LAB — CMP (CANCER CENTER ONLY)
ALT: 17 U/L (ref 0–44)
AST: 23 U/L (ref 15–41)
Albumin: 4.3 g/dL (ref 3.5–5.0)
Alkaline Phosphatase: 49 U/L (ref 38–126)
Anion gap: 11 (ref 5–15)
BUN: 21 mg/dL (ref 8–23)
CO2: 24 mmol/L (ref 22–32)
Calcium: 9.7 mg/dL (ref 8.9–10.3)
Chloride: 105 mmol/L (ref 98–111)
Creatinine: 0.93 mg/dL (ref 0.44–1.00)
GFR, Estimated: >60 mL/min
Glucose, Bld: 210 mg/dL — ABNORMAL HIGH (ref 70–99)
Potassium: 3.9 mmol/L (ref 3.5–5.1)
Sodium: 140 mmol/L (ref 135–145)
Total Bilirubin: 0.5 mg/dL (ref 0.0–1.2)
Total Protein: 6.7 g/dL (ref 6.5–8.1)

## 2024-12-21 LAB — CBC WITH DIFFERENTIAL (CANCER CENTER ONLY)
Abs Immature Granulocytes: 0.01 10*3/uL (ref 0.00–0.07)
Basophils Absolute: 0 10*3/uL (ref 0.0–0.1)
Basophils Relative: 0 %
Eosinophils Absolute: 0 10*3/uL (ref 0.0–0.5)
Eosinophils Relative: 0 %
HCT: 36.3 % (ref 36.0–46.0)
Hemoglobin: 12.3 g/dL (ref 12.0–15.0)
Immature Granulocytes: 0 %
Lymphocytes Relative: 18 %
Lymphs Abs: 0.5 10*3/uL — ABNORMAL LOW (ref 0.7–4.0)
MCH: 32.6 pg (ref 26.0–34.0)
MCHC: 33.9 g/dL (ref 30.0–36.0)
MCV: 96.3 fL (ref 80.0–100.0)
Monocytes Absolute: 0.1 10*3/uL (ref 0.1–1.0)
Monocytes Relative: 2 %
Neutro Abs: 2 10*3/uL (ref 1.7–7.7)
Neutrophils Relative %: 80 %
Platelet Count: 174 10*3/uL (ref 150–400)
RBC: 3.77 MIL/uL — ABNORMAL LOW (ref 3.87–5.11)
RDW: 15.4 % (ref 11.5–15.5)
WBC Count: 2.5 10*3/uL — ABNORMAL LOW (ref 4.0–10.5)
nRBC: 0 % (ref 0.0–0.2)

## 2024-12-21 MED ORDER — CETIRIZINE HCL 10 MG/ML IV SOLN
10.0000 mg | Freq: Once | INTRAVENOUS | Status: AC
  Administered 2024-12-21: 10 mg via INTRAVENOUS
  Filled 2024-12-21: qty 1

## 2024-12-21 MED ORDER — SODIUM CHLORIDE 0.9 % IV SOLN
105.0000 mg/m2 | Freq: Once | INTRAVENOUS | Status: AC
  Administered 2024-12-21: 168 mg via INTRAVENOUS
  Filled 2024-12-21: qty 28

## 2024-12-21 MED ORDER — DEXAMETHASONE SOD PHOSPHATE PF 10 MG/ML IJ SOLN
10.0000 mg | Freq: Once | INTRAMUSCULAR | Status: AC
  Administered 2024-12-21: 10 mg via INTRAVENOUS
  Filled 2024-12-21: qty 1

## 2024-12-21 MED ORDER — FAMOTIDINE IN NACL 20-0.9 MG/50ML-% IV SOLN
20.0000 mg | Freq: Once | INTRAVENOUS | Status: AC
  Administered 2024-12-21: 20 mg via INTRAVENOUS
  Filled 2024-12-21: qty 50

## 2024-12-21 MED ORDER — SODIUM CHLORIDE 0.9 % IV SOLN: INTRAVENOUS | Status: DC

## 2024-12-21 MED ORDER — PALONOSETRON HCL INJECTION 0.25 MG/5ML
0.2500 mg | Freq: Once | INTRAVENOUS | Status: AC
  Administered 2024-12-21: 0.25 mg via INTRAVENOUS
  Filled 2024-12-21: qty 5

## 2024-12-21 MED ORDER — SODIUM CHLORIDE 0.9 % IV SOLN
150.0000 mg | Freq: Once | INTRAVENOUS | Status: AC
  Administered 2024-12-21: 150 mg via INTRAVENOUS
  Filled 2024-12-21: qty 150

## 2024-12-21 MED ORDER — ALPRAZOLAM 0.25 MG PO TABS: 0.2500 mg | ORAL_TABLET | Freq: Every evening | ORAL | 0 refills | Status: AC | PRN

## 2024-12-21 MED ORDER — SODIUM CHLORIDE 0.9 % IV SOLN
326.5000 mg | Freq: Once | INTRAVENOUS | Status: AC
  Administered 2024-12-21: 330 mg via INTRAVENOUS
  Filled 2024-12-21: qty 33

## 2024-12-21 MED ORDER — SODIUM CHLORIDE 0.9% FLUSH: 10.0000 mL | INTRAVENOUS | Status: DC | PRN

## 2024-12-21 NOTE — Patient Instructions (Signed)
 [facility] - A DEPT OF [facility]  Discharge Instructions: Thank you for choosing [facility] to provide your oncology and hematology care.   If you have a lab appointment with the Cancer Center, please go directly to the Cancer Center and check in at the registration area.   Wear comfortable clothing and clothing appropriate for easy access to any Portacath or PICC line.   We strive to give you quality time with your provider. You may need to reschedule your appointment if you arrive late (15 or more minutes).  Arriving late affects you and other patients whose appointments are after yours.  Also, if you miss three or more appointments without notifying the office, you may be dismissed from the clinic at the provider's discretion.      For prescription refill requests, have your pharmacy contact our office and allow 72 hours for refills to be completed.    Today you received the following chemotherapy and/or immunotherapy agents: Paclitaxel and Carboplatin      To help prevent nausea and vomiting after your treatment, we encourage you to take your nausea medication as directed.  BELOW ARE SYMPTOMS THAT SHOULD BE REPORTED IMMEDIATELY: *FEVER GREATER THAN 100.4 F (38 C) OR HIGHER *CHILLS OR SWEATING *NAUSEA AND VOMITING THAT IS NOT CONTROLLED WITH YOUR NAUSEA MEDICATION *UNUSUAL SHORTNESS OF BREATH *UNUSUAL BRUISING OR BLEEDING *URINARY PROBLEMS (pain or burning when urinating, or frequent urination) *BOWEL PROBLEMS (unusual diarrhea, constipation, pain near the anus) TENDERNESS IN MOUTH AND THROAT WITH OR WITHOUT PRESENCE OF ULCERS (sore throat, sores in mouth, or a toothache) UNUSUAL RASH, SWELLING OR PAIN  UNUSUAL VAGINAL DISCHARGE OR ITCHING   Items with * indicate a potential emergency and should be followed up as soon as possible or go to the Emergency Department if any problems should occur.  Please show the CHEMOTHERAPY ALERT CARD  or IMMUNOTHERAPY ALERT CARD at check-in to the Emergency Department and triage nurse.  Should you have questions after your visit or need to cancel or reschedule your appointment, please contact [facility]  Dept: [phone]  and follow the prompts.  Office hours are 8:00 a.m. to 4:30 p.m. Monday - Friday. Please note that voicemails left after 4:00 p.m. may not be returned until the following business day.  We are closed weekends and major holidays. You have access to a nurse at all times for urgent questions. Please call the main number to the clinic Dept: [phone] and follow the prompts.   For any non-urgent questions, you may also contact your provider using MyChart. We now offer e-Visits for anyone 54 and older to request care online for non-urgent symptoms. For details visit [email or web address].   Also download the MyChart app! Go to the app store, search "MyChart", open the app, select [place], and log in with your MyChart username and password.

## 2024-12-21 NOTE — Assessment & Plan Note (Addendum)
 Neutrophil count is adequate We will proceed with treatment without delay

## 2024-12-21 NOTE — Progress Notes (Signed)
 [facility] OFFICE PROGRESS NOTE  Patient Care Team: Duanne Butler DASEN, MD as PCP - General (Family Medicine) [facility], [place]  Assessment & Plan Malignant neoplasm of right ovary [facility]) The patient was discovered to have incidental pelvic mass in September when she was admitted for abdominal pain, leading to subsequent evaluation and surgery Her initial presentation revealed signs of ascites but cytology was negative She is recovering well from surgery and denies abdominal pain now We discussed her pathology report; fluid cytology showed atypical cell and final pathology revealed source of cancer from the right ovary  Cycle 1 of chemotherapy was complicated by severe bone pain, intermittent sensation of neuropathy, poor healing wound, anxiety and jaw claudication Cycle 2 of treatment was complicated by infection, neuropathy, anxiety and mucositis Recent treatment cycle was complicated by persistent anxiety, leukopenia and mild neuropathy We will proceed with treatment as scheduled  Leukopenia due to antineoplastic chemotherapy Neutrophil count is adequate We will proceed with treatment without delay Jaw claudication After extensive discussion, it appears that the jaw claudication is anxiety driven In the past, we have extensive discussions about the use of low-dose Xanax  The patient is still somewhat reluctant to take it I recommend the patient to take it as needed I refilled her prescription today  No orders of the defined types were placed in this encounter.    Nina Bedford, MD  INTERVAL HISTORY: she returns for treatment follow-up Complications related to previous cycle of chemotherapy included leukopenia, and anxiety Since her last visit, she has mistakenly threw away her prescription Xanax  I was able to get her another refill several weeks ago She is still having difficulties coping with anxiety No recent infection  PHYSICAL EXAMINATION: ECOG  PERFORMANCE STATUS: 1 - Symptomatic but completely ambulatory  Lab Results  Component Value Date   CAN125 15.3 11/22/2024   CAN125 14.8 11/01/2024   CAN125 31.3 10/05/2024      Latest Ref Rng & Units 12/21/2024   10:16 AM 11/22/2024   10:46 AM 11/01/2024    9:03 AM  CBC  WBC 4.0 - 10.5 K/uL 2.5  2.9  4.5   Hemoglobin 12.0 - 15.0 g/dL 87.6  87.7  87.9   Hematocrit 36.0 - 46.0 % 36.3  36.2  35.3   Platelets 150 - 400 K/uL 174  175  206       Chemistry      Component Value Date/Time   NA 140 12/21/2024 1016   K 3.9 12/21/2024 1016   CL 105 12/21/2024 1016   CO2 24 12/21/2024 1016   BUN 21 12/21/2024 1016   CREATININE 0.93 12/21/2024 1016   CREATININE 0.76 10/05/2024 1032      Component Value Date/Time   CALCIUM 9.7 12/21/2024 1016   ALKPHOS 49 12/21/2024 1016   AST 23 12/21/2024 1016   ALT 17 12/21/2024 1016   BILITOT 0.5 12/21/2024 1016       There were no vitals filed for this visit. There were no vitals filed for this visit. Other relevant data reviewed during this visit included CBC and CMP

## 2024-12-22 LAB — CA 125: Cancer Antigen (CA) 125: 15.7 U/mL (ref 0.0–38.1)

## 2025-01-17 ENCOUNTER — Inpatient Hospital Stay: Admitting: Hematology and Oncology

## 2025-01-17 ENCOUNTER — Inpatient Hospital Stay

## 2025-01-17 ENCOUNTER — Inpatient Hospital Stay: Attending: Gynecologic Oncology

## 2025-02-11 ENCOUNTER — Encounter: Admitting: Family Medicine
